# Patient Record
Sex: Female | Born: 1950 | Race: White | Hispanic: No | Smoking: Never smoker
Health system: Southern US, Community
[De-identification: ages and names within clinical notes are randomized; demographics above are authoritative.]

## PROBLEM LIST (undated history)

## (undated) DIAGNOSIS — I10 Essential (primary) hypertension: Secondary | ICD-10-CM

## (undated) DIAGNOSIS — E669 Obesity, unspecified: Secondary | ICD-10-CM

## (undated) DIAGNOSIS — R609 Edema, unspecified: Secondary | ICD-10-CM

## (undated) DIAGNOSIS — E119 Type 2 diabetes mellitus without complications: Secondary | ICD-10-CM

## (undated) DIAGNOSIS — E039 Hypothyroidism, unspecified: Secondary | ICD-10-CM

## (undated) HISTORY — PX: BACK SURGERY: SHX140

## (undated) HISTORY — PX: WRIST SURGERY: SHX841

## (undated) HISTORY — PX: BREAST SURGERY: SHX581

## (undated) HISTORY — PX: TUBAL LIGATION: SHX77

## (undated) HISTORY — PX: KNEE SURGERY: SHX244

---

## 1982-11-27 HISTORY — PX: PITUITARY SURGERY: SHX203

## 1988-11-27 HISTORY — PX: REDUCTION MAMMAPLASTY: SUR839

## 1999-04-21 ENCOUNTER — Other Ambulatory Visit: Admission: RE | Admit: 1999-04-21 | Discharge: 1999-04-21 | Payer: Self-pay | Admitting: Obstetrics and Gynecology

## 1999-05-19 ENCOUNTER — Other Ambulatory Visit: Admission: RE | Admit: 1999-05-19 | Discharge: 1999-05-19 | Payer: Self-pay | Admitting: Obstetrics and Gynecology

## 1999-07-07 ENCOUNTER — Ambulatory Visit (HOSPITAL_COMMUNITY): Admission: RE | Admit: 1999-07-07 | Discharge: 1999-07-07 | Payer: Self-pay | Admitting: Neurosurgery

## 2000-11-05 ENCOUNTER — Ambulatory Visit (HOSPITAL_COMMUNITY): Admission: RE | Admit: 2000-11-05 | Discharge: 2000-11-05 | Payer: Self-pay | Admitting: Gastroenterology

## 2000-12-28 ENCOUNTER — Encounter: Admission: RE | Admit: 2000-12-28 | Discharge: 2000-12-28 | Payer: Self-pay | Admitting: Obstetrics and Gynecology

## 2000-12-28 ENCOUNTER — Encounter: Payer: Self-pay | Admitting: Obstetrics and Gynecology

## 2003-07-06 ENCOUNTER — Encounter: Payer: Self-pay | Admitting: Obstetrics and Gynecology

## 2003-07-06 ENCOUNTER — Encounter: Admission: RE | Admit: 2003-07-06 | Discharge: 2003-07-06 | Payer: Self-pay | Admitting: Obstetrics and Gynecology

## 2004-08-05 ENCOUNTER — Encounter: Admission: RE | Admit: 2004-08-05 | Discharge: 2004-08-05 | Payer: Self-pay | Admitting: Obstetrics and Gynecology

## 2005-07-27 ENCOUNTER — Ambulatory Visit (HOSPITAL_COMMUNITY): Admission: RE | Admit: 2005-07-27 | Discharge: 2005-07-27 | Payer: Self-pay | Admitting: Orthopedic Surgery

## 2006-03-22 ENCOUNTER — Encounter: Admission: RE | Admit: 2006-03-22 | Discharge: 2006-03-22 | Payer: Self-pay | Admitting: Endocrinology

## 2007-08-08 ENCOUNTER — Encounter: Admission: RE | Admit: 2007-08-08 | Discharge: 2007-08-08 | Payer: Self-pay | Admitting: Obstetrics and Gynecology

## 2008-04-29 ENCOUNTER — Encounter: Admission: RE | Admit: 2008-04-29 | Discharge: 2008-04-29 | Payer: Self-pay | Admitting: Specialist

## 2008-05-06 ENCOUNTER — Encounter: Admission: RE | Admit: 2008-05-06 | Discharge: 2008-05-06 | Payer: Self-pay | Admitting: Specialist

## 2008-06-04 ENCOUNTER — Inpatient Hospital Stay (HOSPITAL_COMMUNITY): Admission: RE | Admit: 2008-06-04 | Discharge: 2008-06-07 | Payer: Self-pay | Admitting: Neurosurgery

## 2008-08-10 ENCOUNTER — Encounter: Admission: RE | Admit: 2008-08-10 | Discharge: 2008-08-10 | Payer: Self-pay | Admitting: Endocrinology

## 2008-11-11 ENCOUNTER — Encounter: Admission: RE | Admit: 2008-11-11 | Discharge: 2008-11-11 | Payer: Self-pay | Admitting: Sports Medicine

## 2008-12-22 ENCOUNTER — Ambulatory Visit (HOSPITAL_COMMUNITY): Admission: RE | Admit: 2008-12-22 | Discharge: 2008-12-22 | Payer: Self-pay | Admitting: Orthopedic Surgery

## 2009-09-27 ENCOUNTER — Encounter: Admission: RE | Admit: 2009-09-27 | Discharge: 2009-09-27 | Payer: Self-pay | Admitting: Endocrinology

## 2010-10-03 ENCOUNTER — Encounter: Admission: RE | Admit: 2010-10-03 | Discharge: 2010-10-03 | Payer: Self-pay | Admitting: Endocrinology

## 2010-11-08 ENCOUNTER — Encounter: Admission: RE | Admit: 2010-11-08 | Payer: Self-pay | Source: Home / Self Care | Admitting: Neurosurgery

## 2010-11-15 ENCOUNTER — Encounter
Admission: RE | Admit: 2010-11-15 | Discharge: 2010-11-15 | Payer: Self-pay | Source: Home / Self Care | Attending: Neurosurgery | Admitting: Neurosurgery

## 2010-12-27 ENCOUNTER — Ambulatory Visit (HOSPITAL_COMMUNITY)
Admission: RE | Admit: 2010-12-27 | Discharge: 2010-12-27 | Payer: Self-pay | Source: Home / Self Care | Attending: Neurosurgery | Admitting: Neurosurgery

## 2010-12-27 LAB — GLUCOSE, CAPILLARY: Glucose-Capillary: 96 mg/dL (ref 70–99)

## 2011-01-24 ENCOUNTER — Ambulatory Visit (HOSPITAL_COMMUNITY)
Admission: RE | Admit: 2011-01-24 | Discharge: 2011-01-24 | Disposition: A | Discharge status: Home / Self Care | Payer: 59 | Source: Ambulatory Visit | Attending: Neurosurgery | Admitting: Neurosurgery

## 2011-01-24 ENCOUNTER — Other Ambulatory Visit (HOSPITAL_COMMUNITY): Payer: Self-pay | Admitting: Neurosurgery

## 2011-01-24 ENCOUNTER — Encounter (HOSPITAL_COMMUNITY)
Admission: RE | Admit: 2011-01-24 | Discharge: 2011-01-24 | Disposition: A | Discharge status: Home / Self Care | Payer: 59 | Source: Ambulatory Visit | Attending: Neurosurgery | Admitting: Neurosurgery

## 2011-01-24 DIAGNOSIS — Z01810 Encounter for preprocedural cardiovascular examination: Secondary | ICD-10-CM | POA: Insufficient documentation

## 2011-01-24 DIAGNOSIS — M5416 Radiculopathy, lumbar region: Secondary | ICD-10-CM

## 2011-01-24 DIAGNOSIS — M545 Low back pain: Secondary | ICD-10-CM

## 2011-01-24 DIAGNOSIS — R7611 Nonspecific reaction to tuberculin skin test without active tuberculosis: Secondary | ICD-10-CM | POA: Insufficient documentation

## 2011-01-24 DIAGNOSIS — M48061 Spinal stenosis, lumbar region without neurogenic claudication: Secondary | ICD-10-CM

## 2011-01-24 DIAGNOSIS — Z01818 Encounter for other preprocedural examination: Secondary | ICD-10-CM | POA: Insufficient documentation

## 2011-01-24 DIAGNOSIS — R0602 Shortness of breath: Secondary | ICD-10-CM | POA: Insufficient documentation

## 2011-01-24 DIAGNOSIS — I517 Cardiomegaly: Secondary | ICD-10-CM | POA: Insufficient documentation

## 2011-01-24 DIAGNOSIS — Z01812 Encounter for preprocedural laboratory examination: Secondary | ICD-10-CM | POA: Insufficient documentation

## 2011-01-24 DIAGNOSIS — G4730 Sleep apnea, unspecified: Secondary | ICD-10-CM | POA: Insufficient documentation

## 2011-01-24 LAB — CBC
Hemoglobin: 11.7 g/dL — ABNORMAL LOW (ref 12.0–15.0)
MCHC: 31.5 g/dL (ref 30.0–36.0)
MCV: 93.2 fL (ref 78.0–100.0)
Platelets: 182 10*3/uL (ref 150–400)
RBC: 3.98 MIL/uL (ref 3.87–5.11)
RDW: 14.7 % (ref 11.5–15.5)

## 2011-01-24 LAB — BASIC METABOLIC PANEL
BUN: 17 mg/dL (ref 6–23)
CO2: 31 mEq/L (ref 19–32)
Calcium: 9.5 mg/dL (ref 8.4–10.5)
Creatinine, Ser: 0.87 mg/dL (ref 0.4–1.2)
Sodium: 140 mEq/L (ref 135–145)

## 2011-01-24 LAB — TYPE AND SCREEN: Antibody Screen: NEGATIVE

## 2011-01-24 LAB — SURGICAL PCR SCREEN
MRSA, PCR: NEGATIVE
Staphylococcus aureus: NEGATIVE

## 2011-01-26 ENCOUNTER — Inpatient Hospital Stay (HOSPITAL_COMMUNITY): Payer: 59

## 2011-01-26 ENCOUNTER — Inpatient Hospital Stay (HOSPITAL_COMMUNITY)
Admission: RE | Admit: 2011-01-26 | Discharge: 2011-02-01 | DRG: 460 | Disposition: A | Discharge status: Rehabilitation Facility | Payer: 59 | Source: Ambulatory Visit | Attending: Neurosurgery | Admitting: Neurosurgery

## 2011-01-26 DIAGNOSIS — E119 Type 2 diabetes mellitus without complications: Secondary | ICD-10-CM | POA: Diagnosis present

## 2011-01-26 DIAGNOSIS — Z91041 Radiographic dye allergy status: Secondary | ICD-10-CM

## 2011-01-26 DIAGNOSIS — M48062 Spinal stenosis, lumbar region with neurogenic claudication: Secondary | ICD-10-CM | POA: Diagnosis present

## 2011-01-26 DIAGNOSIS — G4733 Obstructive sleep apnea (adult) (pediatric): Secondary | ICD-10-CM | POA: Diagnosis present

## 2011-01-26 DIAGNOSIS — M5137 Other intervertebral disc degeneration, lumbosacral region: Principal | ICD-10-CM | POA: Diagnosis present

## 2011-01-26 DIAGNOSIS — I10 Essential (primary) hypertension: Secondary | ICD-10-CM | POA: Diagnosis present

## 2011-01-26 DIAGNOSIS — F329 Major depressive disorder, single episode, unspecified: Secondary | ICD-10-CM | POA: Diagnosis present

## 2011-01-26 DIAGNOSIS — E039 Hypothyroidism, unspecified: Secondary | ICD-10-CM | POA: Diagnosis present

## 2011-01-26 DIAGNOSIS — Z981 Arthrodesis status: Secondary | ICD-10-CM

## 2011-01-26 DIAGNOSIS — F3289 Other specified depressive episodes: Secondary | ICD-10-CM | POA: Diagnosis present

## 2011-01-26 DIAGNOSIS — M353 Polymyalgia rheumatica: Secondary | ICD-10-CM | POA: Diagnosis present

## 2011-01-26 DIAGNOSIS — M51379 Other intervertebral disc degeneration, lumbosacral region without mention of lumbar back pain or lower extremity pain: Principal | ICD-10-CM | POA: Diagnosis present

## 2011-01-26 LAB — GLUCOSE, CAPILLARY: Glucose-Capillary: 125 mg/dL — ABNORMAL HIGH (ref 70–99)

## 2011-01-27 LAB — CBC
Hemoglobin: 10 g/dL — ABNORMAL LOW (ref 12.0–15.0)
MCHC: 31.9 g/dL (ref 30.0–36.0)
Platelets: 158 10*3/uL (ref 150–400)
RBC: 3.38 MIL/uL — ABNORMAL LOW (ref 3.87–5.11)

## 2011-01-27 LAB — GLUCOSE, CAPILLARY
Glucose-Capillary: 118 mg/dL — ABNORMAL HIGH (ref 70–99)
Glucose-Capillary: 157 mg/dL — ABNORMAL HIGH (ref 70–99)
Glucose-Capillary: 131 mg/dL — ABNORMAL HIGH (ref 70–99)
Glucose-Capillary: 136 mg/dL — ABNORMAL HIGH (ref 70–99)
Glucose-Capillary: 126 mg/dL — ABNORMAL HIGH (ref 70–99)

## 2011-01-27 LAB — BASIC METABOLIC PANEL
CO2: 29 mEq/L (ref 19–32)
Calcium: 8.8 mg/dL (ref 8.4–10.5)
Chloride: 102 mEq/L (ref 96–112)
GFR calc Af Amer: >60 mL/min (ref >60)
Sodium: 139 mEq/L (ref 135–145)

## 2011-01-28 LAB — GLUCOSE, CAPILLARY
Glucose-Capillary: 111 mg/dL — ABNORMAL HIGH (ref 70–99)
Glucose-Capillary: 91 mg/dL (ref 70–99)

## 2011-01-29 LAB — GLUCOSE, CAPILLARY
Glucose-Capillary: 142 mg/dL — ABNORMAL HIGH (ref 70–99)
Glucose-Capillary: 97 mg/dL (ref 70–99)
Glucose-Capillary: 85 mg/dL (ref 70–99)
Glucose-Capillary: 134 mg/dL — ABNORMAL HIGH (ref 70–99)
Glucose-Capillary: 138 mg/dL — ABNORMAL HIGH (ref 70–99)

## 2011-01-30 DIAGNOSIS — IMO0002 Reserved for concepts with insufficient information to code with codable children: Secondary | ICD-10-CM

## 2011-01-30 DIAGNOSIS — M48061 Spinal stenosis, lumbar region without neurogenic claudication: Secondary | ICD-10-CM

## 2011-01-30 LAB — GLUCOSE, CAPILLARY
Glucose-Capillary: 128 mg/dL — ABNORMAL HIGH (ref 70–99)
Glucose-Capillary: 118 mg/dL — ABNORMAL HIGH (ref 70–99)
Glucose-Capillary: 112 mg/dL — ABNORMAL HIGH (ref 70–99)

## 2011-01-30 LAB — POCT I-STAT 4, (NA,K, GLUC, HGB,HCT)
Glucose, Bld: 147 mg/dL — ABNORMAL HIGH (ref 70–99)
HCT: 28 % — ABNORMAL LOW (ref 36.0–46.0)
Potassium: 3.1 mEq/L — ABNORMAL LOW (ref 3.5–5.1)

## 2011-01-31 LAB — GLUCOSE, CAPILLARY
Glucose-Capillary: 107 mg/dL — ABNORMAL HIGH (ref 70–99)
Glucose-Capillary: 100 mg/dL — ABNORMAL HIGH (ref 70–99)

## 2011-02-01 ENCOUNTER — Inpatient Hospital Stay (HOSPITAL_COMMUNITY)
Admission: RE | Admit: 2011-02-01 | Discharge: 2011-02-11 | DRG: 945 | Disposition: A | Discharge status: Home / Self Care | Payer: 59 | Source: Other Acute Inpatient Hospital | Attending: Physical Medicine & Rehabilitation | Admitting: Physical Medicine & Rehabilitation

## 2011-02-01 DIAGNOSIS — E119 Type 2 diabetes mellitus without complications: Secondary | ICD-10-CM | POA: Diagnosis present

## 2011-02-01 DIAGNOSIS — E1165 Type 2 diabetes mellitus with hyperglycemia: Secondary | ICD-10-CM

## 2011-02-01 DIAGNOSIS — I10 Essential (primary) hypertension: Secondary | ICD-10-CM | POA: Diagnosis present

## 2011-02-01 DIAGNOSIS — M51379 Other intervertebral disc degeneration, lumbosacral region without mention of lumbar back pain or lower extremity pain: Secondary | ICD-10-CM | POA: Diagnosis present

## 2011-02-01 DIAGNOSIS — G4733 Obstructive sleep apnea (adult) (pediatric): Secondary | ICD-10-CM | POA: Diagnosis present

## 2011-02-01 DIAGNOSIS — IMO0001 Reserved for inherently not codable concepts without codable children: Secondary | ICD-10-CM | POA: Diagnosis present

## 2011-02-01 DIAGNOSIS — Z981 Arthrodesis status: Secondary | ICD-10-CM

## 2011-02-01 DIAGNOSIS — Z5189 Encounter for other specified aftercare: Principal | ICD-10-CM

## 2011-02-01 DIAGNOSIS — E669 Obesity, unspecified: Secondary | ICD-10-CM

## 2011-02-01 DIAGNOSIS — M4716 Other spondylosis with myelopathy, lumbar region: Secondary | ICD-10-CM

## 2011-02-01 DIAGNOSIS — M5137 Other intervertebral disc degeneration, lumbosacral region: Secondary | ICD-10-CM | POA: Diagnosis present

## 2011-02-01 DIAGNOSIS — E875 Hyperkalemia: Secondary | ICD-10-CM | POA: Diagnosis not present

## 2011-02-01 DIAGNOSIS — E876 Hypokalemia: Secondary | ICD-10-CM | POA: Diagnosis present

## 2011-02-01 DIAGNOSIS — R5381 Other malaise: Secondary | ICD-10-CM | POA: Diagnosis present

## 2011-02-01 DIAGNOSIS — D62 Acute posthemorrhagic anemia: Secondary | ICD-10-CM | POA: Diagnosis present

## 2011-02-01 DIAGNOSIS — K5900 Constipation, unspecified: Secondary | ICD-10-CM | POA: Diagnosis present

## 2011-02-01 DIAGNOSIS — E039 Hypothyroidism, unspecified: Secondary | ICD-10-CM | POA: Diagnosis present

## 2011-02-01 DIAGNOSIS — R5383 Other fatigue: Secondary | ICD-10-CM | POA: Diagnosis present

## 2011-02-01 DIAGNOSIS — F3289 Other specified depressive episodes: Secondary | ICD-10-CM | POA: Diagnosis present

## 2011-02-01 DIAGNOSIS — F329 Major depressive disorder, single episode, unspecified: Secondary | ICD-10-CM | POA: Diagnosis present

## 2011-02-01 DIAGNOSIS — E2749 Other adrenocortical insufficiency: Secondary | ICD-10-CM | POA: Diagnosis present

## 2011-02-01 DIAGNOSIS — M1710 Unilateral primary osteoarthritis, unspecified knee: Secondary | ICD-10-CM | POA: Diagnosis present

## 2011-02-01 LAB — MRSA PCR SCREENING: MRSA by PCR: NEGATIVE

## 2011-02-02 DIAGNOSIS — I10 Essential (primary) hypertension: Secondary | ICD-10-CM

## 2011-02-02 DIAGNOSIS — E1165 Type 2 diabetes mellitus with hyperglycemia: Secondary | ICD-10-CM

## 2011-02-02 DIAGNOSIS — M4716 Other spondylosis with myelopathy, lumbar region: Secondary | ICD-10-CM

## 2011-02-02 DIAGNOSIS — E669 Obesity, unspecified: Secondary | ICD-10-CM

## 2011-02-02 LAB — GLUCOSE, CAPILLARY: Glucose-Capillary: 104 mg/dL — ABNORMAL HIGH (ref 70–99)

## 2011-02-02 LAB — CBC
HCT: 25.5 % — ABNORMAL LOW (ref 36.0–46.0)
Hemoglobin: 8.1 g/dL — ABNORMAL LOW (ref 12.0–15.0)
MCV: 92.4 fL (ref 78.0–100.0)
Platelets: 198 10*3/uL (ref 150–400)
RBC: 2.76 MIL/uL — ABNORMAL LOW (ref 3.87–5.11)
WBC: 3.4 10*3/uL — ABNORMAL LOW (ref 4.0–10.5)

## 2011-02-02 LAB — COMPREHENSIVE METABOLIC PANEL
AST: 15 U/L (ref 0–37)
Albumin: 2.6 g/dL — ABNORMAL LOW (ref 3.5–5.2)
Alkaline Phosphatase: 35 U/L — ABNORMAL LOW (ref 39–117)
BUN: 7 mg/dL (ref 6–23)
Chloride: 95 mEq/L — ABNORMAL LOW (ref 96–112)
Potassium: 3.4 mEq/L — ABNORMAL LOW (ref 3.5–5.1)
Total Bilirubin: 0.5 mg/dL (ref 0.3–1.2)

## 2011-02-02 LAB — DIFFERENTIAL
Eosinophils Absolute: 0.1 10*3/uL (ref 0.0–0.7)
Lymphocytes Relative: 25 % (ref 12–46)
Lymphs Abs: 0.9 10*3/uL (ref 0.7–4.0)
Neutro Abs: 2 10*3/uL (ref 1.7–7.7)
Neutrophils Relative %: 57 % (ref 43–77)

## 2011-02-03 DIAGNOSIS — I10 Essential (primary) hypertension: Secondary | ICD-10-CM

## 2011-02-03 DIAGNOSIS — E1165 Type 2 diabetes mellitus with hyperglycemia: Secondary | ICD-10-CM

## 2011-02-03 DIAGNOSIS — E669 Obesity, unspecified: Secondary | ICD-10-CM

## 2011-02-03 DIAGNOSIS — IMO0001 Reserved for inherently not codable concepts without codable children: Secondary | ICD-10-CM

## 2011-02-03 DIAGNOSIS — M4716 Other spondylosis with myelopathy, lumbar region: Secondary | ICD-10-CM

## 2011-02-03 LAB — GLUCOSE, CAPILLARY: Glucose-Capillary: 89 mg/dL (ref 70–99)

## 2011-02-03 NOTE — Op Note (Signed)
Kathy Simpson, APSEY               ACCOUNT NO.:  [id number]  MEDICAL RECORD NO.:  [id number]           PATIENT TYPE:  I  LOCATION:  3113                         FACILITY:  [facility]  PHYSICIAN:  Cristi Loron, M.D.DATE OF BIRTH:  01-Aug-1951  DATE OF PROCEDURE:  01/26/2011 DATE OF DISCHARGE:                              OPERATIVE REPORT   BRIEF HISTORY:  The patient is a 59 year old white female who I performed an L4-5 decompression, instrumentation and fusion about 3 years ago.  The patient has generally done well.  She, however, has some chronic back pain.  The patient has developed increasing back, hip, and leg pain consistent with neurogenic claudication more recently.  She failed medical management, worked up with a lumbar myelo-CT. This study demonstrated the patient had significant disk degeneration, stenosis, etc. at L3-4.  I discussed the various treatment options with the patient including surgery.  She has weighed the risks, benefits, and alternatives of surgery, and decided to proceed with L3-4 decompression, instrumentation and fusion, as well as exploration of lumbar fusion given her history of chronic back pain.  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  PROCEDURE:  Exploration of lumbar fusion; L3-4 posterior lumbar interbody fusion with local morselized autograft bone and Actifuse bone graft extender; insertion of L3-4 interbody prosthesis (Capstone PEEK interbody prosthesis); L3-L5 posterior segmental instrumentation with Legacy titanium pedicle screws and rods; L3-4 posterolateral arthrodesis with local morselized autograft bone and Vitoss bone graft extender. Bilateral L3 laminotomies and foraminotomies to decompress bilateral L3 as well as L4 nerve roots (the work required due to this was in addition to  work required to do posterior lumbar interbody fusion because of the patient's severe spinal stenosis, facet arthropathy, etc. requiring a wide facetectomy and wide laminotomies to decompress interbodies).  SURGEON:  Cristi Loron, MD  ASSISTANT:  Hewitt Shorts, MD  ANESTHESIA:  General endotracheal.  ESTIMATED BLOOD LOSS:  350 mL.  SPECIMENS:  None.  DRAINS:  None.  COMPLICATIONS:  None.  DESCRIPTION OF PROCEDURE:  The patient was brought to the operating room by the Anesthesia team.  General endotracheal anesthesia was induced. The patient was turned to the prone position on the Wilson frame.  Her lumbosacral region was then prepared with Betadine scrub and Betadine solution.  Sterile drapes were applied.  I then injected the area to be incised with Marcaine with epinephrine solution.  I used a scalpel to make a linear midline incision through the patient's previous surgical scar.  I used electrocautery to perform a bilateral subperiosteal dissection exposing spinous process and lamina from approximately L2 down to L5.  We used electrocautery to expose the previous hardware at L4-5, which was used for localization purposes.  We inserted the Versatrac retractor for exposure.  We then explored the fusion.  We removed the caps from the old screws at L4-L5 and removed the rods.  I attempted to remove the screws independently, but it seemed that she had a good fusion at L4-5.  Now, we turned our attention to  decompression.  I used high-speed to perform bilateral 3 laminotomies.  I widened laminotomies with Kerrison punch and removed the inferior facet of L3 by removing L3-4 ligamentum flavum.  In the cephalad aspect of the L4 lamina, I performed wide foraminotomies about the bilateral L3 and L4 nerve roots completing the decompression at this level.  We now turned attention to the posterior lumbar interbody fusion.  I incised the L3-4 and L4-5 intervertebral disk  bilaterally, performed a partial intervertebral dissection with the pituitary forceps.  We then prepared the vertebral endplates for fusion by removing soft tissues using the curettes and pituitary forceps.  Used trial spacers and determined to use a 14 x 26-mm interbody prosthesis bilaterally.  We prefilled this prosthesis with local morselized autograft bone that we obtained during decompression as well as Actifuse bone graft extender. We inserted the prosthesis into the L3-4 interspaces.  Of course after retracting the neural structures out of harm's way.  There was good snug fit of the prosthesis in interspace.  We filled the remainder of the disk space with Actifuse and local autograft bone completing the posterior lumbar interbody fusion.  We now turned our attention to the instrumentation.  We attempted to use intraoperative fluoroscopy, but because of the patient's body habitus, we could not get any useful images.  We also attempted use intraoperative x-ray, which was unhelpful either.  I cannulated the bilateral L3 pedicles with the bone probe.  I removed the bone probes and then tapped the pedicles with a 6.5-mm tap.  I then removed the tapped and probed inside the tapped pedicle to rule out cortical breeches.  I then inserted a 7.5 x 45-mm pedicles bilaterally into the L3 pedicle.  We got good bony purchase.  I then palpated along the medial aspect of the L3 pedicle and noted there that there were no cortical breeches.  We then connected the unilateral pedicle screws from L3-L5 with a lordotic rod.  We compressed the construct and secured the rod in place with the caps.  This completed the instrumentation.  We now turned our attention to the posterolateral arthrodesis.  I used high-speed drill to decorticate the remainder of the L3-4 pars and transverse process with a combination of local autograft bone and Vitoss bone graft extender over these decorticated posterior  structures.  This completed the posterolateral arthrodesis.  We then obtained hemostasis using bipolar cautery.  We irrigated the wound out with bacitracin solution.  We then inspected the thecal sac in bilateral L3-4 nerve roots and noted the neural structure well decompressed.  We then removed the retractor and then reapproximated the patient's thoracolumbar fascia with interrupted #1 Vicryl suture, subcutaneous tissue with interrupted 2-0 Vicryl suture, and skin with Steri-Strips and Benzoin.  The wound was then coated with bacitracin ointment and sterile dressing applied.  The drapes were removed.  The patient was subsequently returned to supine position where she was extubated by the Anesthesia team and transported to post anesthesia care unit in stable condition.  All sponge, instrument, and needle counts were correct at the end of this case.     Cristi Loron, M.D.     JDJ/MEDQ  D:  01/26/2011  T:  01/27/2011  Job:  [id number]  Electronically Signed by Tressie Stalker M.D. on 02/02/2011 02:39:16 PM

## 2011-02-04 DIAGNOSIS — M4716 Other spondylosis with myelopathy, lumbar region: Secondary | ICD-10-CM

## 2011-02-04 DIAGNOSIS — E669 Obesity, unspecified: Secondary | ICD-10-CM

## 2011-02-04 DIAGNOSIS — I10 Essential (primary) hypertension: Secondary | ICD-10-CM

## 2011-02-04 DIAGNOSIS — E1165 Type 2 diabetes mellitus with hyperglycemia: Secondary | ICD-10-CM

## 2011-02-04 LAB — GLUCOSE, CAPILLARY
Glucose-Capillary: 108 mg/dL — ABNORMAL HIGH (ref 70–99)
Glucose-Capillary: 128 mg/dL — ABNORMAL HIGH (ref 70–99)

## 2011-02-05 LAB — GLUCOSE, CAPILLARY

## 2011-02-06 LAB — GLUCOSE, CAPILLARY
Glucose-Capillary: 98 mg/dL (ref 70–99)
Glucose-Capillary: 109 mg/dL — ABNORMAL HIGH (ref 70–99)
Glucose-Capillary: 104 mg/dL — ABNORMAL HIGH (ref 70–99)

## 2011-02-06 LAB — BASIC METABOLIC PANEL
GFR calc non Af Amer: >60 mL/min (ref >60)
Glucose, Bld: 97 mg/dL (ref 70–99)
Potassium: 3.5 mEq/L (ref 3.5–5.1)
Sodium: 137 mEq/L (ref 135–145)

## 2011-02-07 LAB — GLUCOSE, CAPILLARY
Glucose-Capillary: 92 mg/dL (ref 70–99)
Glucose-Capillary: 106 mg/dL — ABNORMAL HIGH (ref 70–99)
Glucose-Capillary: 115 mg/dL — ABNORMAL HIGH (ref 70–99)

## 2011-02-08 LAB — GLUCOSE, CAPILLARY
Glucose-Capillary: 95 mg/dL (ref 70–99)
Glucose-Capillary: 115 mg/dL — ABNORMAL HIGH (ref 70–99)

## 2011-02-09 LAB — GLUCOSE, CAPILLARY
Glucose-Capillary: 99 mg/dL (ref 70–99)
Glucose-Capillary: 123 mg/dL — ABNORMAL HIGH (ref 70–99)
Glucose-Capillary: 131 mg/dL — ABNORMAL HIGH (ref 70–99)

## 2011-02-10 DIAGNOSIS — E1165 Type 2 diabetes mellitus with hyperglycemia: Secondary | ICD-10-CM

## 2011-02-10 DIAGNOSIS — M4716 Other spondylosis with myelopathy, lumbar region: Secondary | ICD-10-CM

## 2011-02-10 DIAGNOSIS — I10 Essential (primary) hypertension: Secondary | ICD-10-CM

## 2011-02-10 DIAGNOSIS — E669 Obesity, unspecified: Secondary | ICD-10-CM

## 2011-02-10 LAB — GLUCOSE, CAPILLARY
Glucose-Capillary: 127 mg/dL — ABNORMAL HIGH (ref 70–99)
Glucose-Capillary: 111 mg/dL — ABNORMAL HIGH (ref 70–99)

## 2011-02-10 NOTE — H&P (Signed)
Kathy Simpson, Kathy Simpson               ACCOUNT NO.:  [id number]  MEDICAL RECORD NO.:  [id number]           PATIENT TYPE:  I  LOCATION:  4028                         FACILITY:  [facility]  PHYSICIAN:  Ranelle Oyster, M.D.DATE OF BIRTH:  1950-12-07  DATE OF ADMISSION:  02/01/2011 DATE OF DISCHARGE:                             HISTORY & PHYSICAL   CHIEF COMPLAINT:  Low back pain and left leg weakness.  PRIMARY CARE PHYSICIAN:  Thora Lance, M.D.  NEUROSURGEON:  Cristi Loron, M.D.  HISTORY OF PRESENT ILLNESS:  This is a 60 year old white female with obesity, with history of decompression at L4-L5 in 2009; admitted on March 1 with increased low back pain radiating to the lower extremities. She had recent lumbar scan showing L3-L4 degenerative disk disease, stenosis with radiculopathy.  She underwent exploration of the L3-L4 and PLIF with L3-L5 posterior segmental instrumentation on March 1 by Dr. Lovell Sheehan.  She is to wear back brace when she is out of bed.  Pain control has been adequate.  She complains of left weakness and difficulty with general movement.  Rehab is asked to consult on the patient, who felt that she would be a good candidate for inpatient rehab.  REVIEW OF SYSTEMS:  Notable for depression, low back pain, and weakness. Full 12-point review is in the written H and P.  PAST MEDICAL HISTORY:  Positive for hypertension, transsphenoidal hypophysectomy for pituitary tumor, hypothyroidism, polymyalgia rheumatica, depression, obesity, 444 pounds, diabetes non-insulin- requiring, L4-L5 decompression in 2009, ORIF right wrist, hemorrhoidectomy, left knee scope, and bilateral breast reduction.  FAMILY HISTORY:  Positive for CAD.  SOCIAL HISTORY:  The patient lives with her husband.  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  Husband had back surgery 5 weeks ago.  A 1- level house with ramp.  A local daughter works.  Sister is an Astronomer. here at [employer].  The patient  does not smoke or drink.  ALLERGIES:  MAXZIDE, CONTRAST MEDIA, ADHESIVE TAPE, MORPHINE SULFATE.  MEDICATIONS:  Please see written H and P.  PHYSICAL EXAMINATION:  VITAL SIGNS:  Blood pressure is 108/65, pulse is 71, respiratory rate is 24, and temperature 98.4. GENERAL:  The patient is pleasant, alert, sitting in bed.  She is morbidly obese.  Pupils are equally round and reactive to light. HEENT:  Her nose and throat exam is notable for fair dentition and pink moist mucosa. NECK:  Supple without JVD or lymphadenopathy. CHEST:  Clear to auscultation without wheezes, rales, or rhonchi. HEART:  Regular rate and rhythm without murmur, rubs, or gallops. EXTREMITIES:  No clubbing or cyanosis, but 2+ nonpitting edema. ABDOMEN:  Soft, nontender.  Bowel sounds are positive. SKIN:  Notable for back incision which was clean and intact.  There were some reddened areas which were likely tape burns.  Otherwise, no obvious signs of breakdown. NEUROLOGIC:  Cranial nerves II-XII are intact.  Reflexes 1+.  Sensation is grossly normal in all four limbs.  Judgment, orientation, memory, and mood were all appropriate.  Strength in the upper extremities is 5/5. Left lower extremity was 1/5 hip flexion, 2/5 knee extension  and flexion, 3+ to 4 out of 5 ankle dorsiflexion and plantar flexion.  Right lower extremity is 2/5 with hip, 3/5 right knee, and 4/5 right ankle.  POST-ADMISSION PHYSICIAN EVALUATION: 1. Functional deficit secondary to lumbar stenosis and radiculopathy,     status post L3-L5 posterior segmental instrumentation and     exploration of L3-L4 PLIF on January 26, 2011. 2. The patient is admitted to receive collaborative interdisciplinary     care between the physiatrist, rehab nursing staff, and therapy     team. 3. The patient's level of medical complexity and substantial therapy     needs in context of that medical necessity cannot be provided at a     lesser intensity of care. 4. The  patient has experienced substantial functional loss from her     baseline.  Previously, she was independent in driving.  Currently,     she is total assist bed mobility, min assist transfer, min assist     gait, 4 feet rolling walker, and mod to max assist ADLs.  Judging     by the patient's diagnosis, physical exam, and functional history,     she has a potential for functional progress which will result in     measurable gains while in inpatient rehab.  These gains will be of     substantial and practical use upon discharge to home and facilitate     mobility and self-care. 5. The physiatrist will provide 24-hour management of medical needs as     well as oversight of therapy plan/treatment and provide guidance as     appropriate regarding interaction of the two.  Medical problem list     and plan are below. 6. The 24-hour rehab nursing team will assist in managing the     patient's skin care needs as well as bowel and bladder function,     safety awareness, and integration of therapy concepts and     techniques. 7. PT will assess and treat for lower extremity strength, range of     motion, adaptive techniques, equipment, functional ability, family     education with goals supervision for simple mobility tasks. 8. OT will assess and treat for upper extremity use, ADLs, adaptive     techniques, equipment, functional mobility, gait, donning and     doffing of brace with goals modified independent to occasional set     up with ADLs. 9. Case management and social worker will assess and treat for     psychosocial issues at discharge. 10.Team conference to be held weekly to assess progress towards goals     and determine barriers of discharge. 11.The patient demonstrated sufficient medical stability and exercise     capacity to tolerate at least 3 hours of therapy daily, at least 5     days per week. 12.Estimated length of stay is approximately 2 weeks.  Prognosis is     good.  MEDICAL  PROBLEM LIST AND PLAN: 1. DVT prophylaxis with PAS stockings.  Also consider low-dose     Lovenox. 2. Pain managed with oxycodone IR and Robaxin. 3. Mood.  Provided positive reinforcement.  The patient is on Celexa     for depression and anxiety. 4. Renal insufficiency.  Continue hydrocortisone per baseline regimen     as well as thyroid supplementation. 5. Blood pressure.  Continue atenolol, HCTZ, and lisinopril with good     control at present. 6. Non-insulin-requiring diabetes.  Actos 45 mg daily. 7. Obstructive sleep  apnea:  CPAP.     Ranelle Oyster, M.D.     ZTS/MEDQ  D:  02/01/2011  T:  02/02/2011  Job:  [id number]  cc:   Thora Lance, M.D. Cristi Loron, M.D.  Electronically Signed by Faith Rogue M.D. on 02/10/2011 04:16:47 PM

## 2011-02-10 NOTE — Discharge Summary (Signed)
Kathy Simpson, Kathy Simpson               ACCOUNT NO.:  [id number]  MEDICAL RECORD NO.:  [id number]           PATIENT TYPE:  I  LOCATION:  4028                         FACILITY:  [facility]  PHYSICIAN:  Ranelle Oyster, M.D.DATE OF BIRTH:  1951-08-11  DATE OF ADMISSION:  02/01/2011 DATE OF DISCHARGE:  02/11/2011                              DISCHARGE SUMMARY   DISCHARGE DIAGNOSES: 1. Lumbar stenosis with radiculopathy. 2. Subcutaneous Lovenox for deep vein thrombosis prophylaxis. 3. Pain management. 4. Depression. 5. Hypertension. 6. Adrenal insufficiency. 7. Hypothyroidism. 8. Noninsulin-dependent diabetes mellitus. 9. Morbid obesity. 10.Obstructive sleep apnea.  PROCEDURES:  Status post lumbar L3-L5 posterior segmental instrumentation and exploration of lumbar 3-4 with PLIF on January 26, 2011.  A 60 year old obese female with history of lumbar decompression in 2009, admitted on January 26, 2011 with increased low back pain radiating to lower extremities.  Recent lumbar myelogram showed lumbar L3-4 degenerative disk disease, stenosis, with radiculopathy.  She underwent exploration of lumbar L3-4 PLIF with lumbar L3-L5 posterior segmental instrumentation on January 26, 2011 per Dr. Lovell Sheehan.  Back brace when out of bed.  Pain management with PCA, discontinued January 28, 2011.  The patient uses CPAP at bedtime for obstructive sleep apnea.  Total assist for bed mobility, minimal assist for ambulation.  The patient was admitted for comprehensive rehab program.  PAST MEDICAL HISTORY:  See discharge diagnoses.  No alcohol or tobacco.  ALLERGIES:  MAXZIDE, CONTRAST MEDIA, ADHESIVE TAPE, and MORPHINE SULFATE.  SOCIAL HISTORY:  Lives with husband, works for [employer] as a Designer, jewellery.  Husband with recent back surgery 5 weeks ago, 1-level home with a ramp.  Local daughter, works.  Sister is a Designer, jewellery at [employer].  Functional history prior to admission  was independent driving.  Functional status upon admission to rehab services was total assist bed mobility, minimal assist transfers, minimal assistance ambulation with a rolling walker, moderate to max assist activities of daily living.  MEDICATIONS PRIOR TO ADMISSION: 1. Hydrocortisone 20 mg a.m., 10 mg at bedtime. 2. Quinapril 40 mg daily. 3. Celexa 20 mg daily. 4. Atenolol 100 mg daily. 5. Tramadol as needed. 6. Potassium chloride 20 mEq a.m., 10 mg at bedtime. 7. Hydrochlorothiazide 50 mg daily. 8. Actos 45 mg daily. 9. Synthroid 175 mcg daily.  PHYSICAL EXAMINATION:  VITAL SIGNS:  Blood pressure 108/65, pulse 71, temperature 98.4, respirations 20. NEUROLOGIC:  This was an alert female in no acute distress, oriented x3. Deep tendon reflexes 2+.  She moves all extremities. Sensation mildly decreased to light touch distally. BACK:  Incision clean, dry, and intact. LUNGS:  Clear to auscultation. CARDIAC:  Regular rate and rhythm. ABDOMEN:  Soft, nontender.  Good bowel sounds.  REHABILITATION HOSPITAL COURSE:  The patient was admitted to inpatient rehab services with therapies initiated on a 3-hour daily basis consisting of physical therapy, occupational therapy, and rehabilitation nursing.  The following issues were addressed during the patient's rehabilitation stay.  Pertaining to Ms. Woon's lumbar stenosis, radiculopathy with lumbar exploration as well as L3-5 posterior segmental instrumentation on January 26, 2011 per Dr. Tressie Stalker, surgical site healing nicely.  Neurovascular sensation intact.  She was wearing a back brace when out of bed.  Pain management ongoing with the use of Voltaren gel as well as oxycodone for breakthrough pain, Robaxin for muscle spasms on an as-needed basis with good results.  She continued on subcutaneous Lovenox for deep vein thrombosis prophylaxis. Her blood pressures were well-controlled on atenolol, hydrochlorothiazide, and  lisinopril.  She did have a history of adrenal insufficiency and remained on hydrocortisone as prior to hospital admission.  She will continue hormone supplement for hypothyroidism. Her blood sugars were well controlled on Actos and diabetic diet with good results.  The patient received weekly collaborative interdisciplinary team conferences to discuss estimated length of stay, family teaching, and any barriers to discharge.  She was supervision to minimal assist overall for activities of daily living, supervision for mobility except occasional minimal assist for sit to supine.  Strength and endurance greatly improved throughout her rehab stay.  She was encouraged with her overall progress.  Her Foley catheter tube had been removed.  She was voiding without difficulty.  She did have some mild constipation, which was resolved with laxative assistance.  Anemia:  Latest hemoglobin stable.  Chemistries unremarkable.  Latest hemoglobin of 8.1 with no orthostatic blood pressure changes.  No chest pain.  No shortness of breath.  She was discharged to home after family teaching completed.  DISCHARGE MEDICATIONS:  Included: 1. Atenolol 100 mg daily. 2. Celexa 20 mg daily. 3. Colace 100 mg twice daily. 4. Hydrochlorothiazide 50 mg daily. 5. Hydrocortisone 20 mg a.m., 10 mg p.m. 6. Synthroid 175 mcg daily. 7. Lisinopril 40 mg daily. 8. Actos 45 mg at bedtime. 9. Ferrous sulfate 325 mg twice daily. 10.Potassium chloride 20 mEq twice daily. 11.Voltaren gel twice daily to affected area. 12.Robaxin 500 mg every 6 hours as needed for spasms, dispense of 90     tablets. 13.Oxycodone immediate release 5 mg 1 or 2 tablets every 4 hours as     needed, dispense of 90 tablets.  Her diet was a diabetic diet.  SPECIAL INSTRUCTIONS:  Back brace when out of bed.  The patient should follow up with Dr. Tressie Stalker, Neurosurgery, call for appointment; Dr. Faith Rogue at the outpatient rehab service  office as needed; Dr. Kirby Funk, medical management.     Mariam Dollar, P.A.   ______________________________ Ranelle Oyster, M.D.    DA/MEDQ  D:  02/09/2011  T:  02/09/2011  Job:  [id number]  cc:   Cristi Loron, M.D. Thora Lance, M.D.  Electronically Signed by Mariam Dollar P.A. on 02/10/2011 02:42:50 PM Electronically Signed by Faith Rogue M.D. on 02/10/2011 04:16:51 PM

## 2011-02-27 NOTE — Discharge Summary (Signed)
  NAMESAMEENA, Kathy Simpson               ACCOUNT NO.:  [id number]  MEDICAL RECORD NO.:  [id number]           PATIENT TYPE:  I  LOCATION:  3033                         FACILITY:  [facility]  PHYSICIAN:  Cristi Loron, M.D.DATE OF BIRTH:  [birth year]  DATE OF ADMISSION:  01/26/2011 DATE OF DISCHARGE:  02/01/2011                              DISCHARGE SUMMARY   BRIEF HISTORY:  The patient is a 60 year old morbidly obese female who I performed L4-5 decompression, instrumentation and fusion about 3 years ago.  The patient has generally did well, however, she has had some chronic back pain.  The patient has developed increasing back and leg pain consistent with neurogenic claudication.  She failed medical management and worked up with a lumbar mild CT.  This study demonstrated the patient has significant disk degeneration stenosis etc at L3-4.  I discussed the various treatment options with the patient including surgery.  She has weighed the risks, benefits and alternatives of surgery and decided to proceed with an L3-4 decompression, instrumentation and fusion, as well as exploration of lumbar fusion given her history of chronic low back pain.  For further details of this admission, please refer to typed history and physical.  HOSPITAL COURSE:  Admitted the patient to [facility] on January 26, 2011, the day of admission I performed an exploration of lumbar fusion with an L3-4, decompression, instrumentation and fusion.  The surgery went well (for full details of this operation please refer to typed operative note).  POSTOPERATIVE COURSE:  The patient's postoperative course was remarkable as follows.  We had PT, OT and PMR see the patient.  The patient was slow to mobilize.  By February 01, 2011, the patient was afebrile, vital signs stable and she was transferred up in the inpatient rehabilitation unit.  FINAL DIAGNOSES:  L3-4 disk degeneration, spinal stenosis, lumbar radiculopathy,  lumbago, morbid obesity.  Procedure performed was exploration of lumbar fusion; L3-4 posterior lumbar interbody fusion with local morselized autograft bone and Actifuse bone graft extender; insertion of L3-4 interbody prosthesis capsule PEEK interbody prosthesis; L3-L5 posterior segmental fixation with legacy titanium pedicle screws and rods; L3-4 posterior and lateral arthrodesis with local morselized autograft bone and Vitoss bone graft extender; bilateral L3 laminotomies and foraminotomies to decompress bilateral L3 as well as L4 nerve roots.     Cristi Loron, M.D.     JDJ/MEDQ  D:  02/20/2011  T:  02/21/2011  Job:  [id number]  Electronically Signed by Tressie Stalker M.D. on 02/27/2011 07:33:17 AM

## 2011-03-13 LAB — BASIC METABOLIC PANEL
CO2: 29 mEq/L (ref 19–32)
Calcium: 9.1 mg/dL (ref 8.4–10.5)
Chloride: 97 mEq/L (ref 96–112)
Creatinine, Ser: 0.69 mg/dL (ref 0.4–1.2)
Glucose, Bld: 112 mg/dL — ABNORMAL HIGH (ref 70–99)

## 2011-03-13 LAB — GLUCOSE, CAPILLARY
Glucose-Capillary: 115 mg/dL — ABNORMAL HIGH (ref 70–99)
Glucose-Capillary: 92 mg/dL (ref 70–99)

## 2011-03-13 LAB — CBC
Hemoglobin: 10.5 g/dL — ABNORMAL LOW (ref 12.0–15.0)
MCHC: 33.5 g/dL (ref 30.0–36.0)
MCV: 90.9 fL (ref 78.0–100.0)
RDW: 16.2 % — ABNORMAL HIGH (ref 11.5–15.5)

## 2011-04-11 NOTE — Op Note (Signed)
Kathy Simpson, Kathy Simpson               ACCOUNT NO.:  [id number]   MEDICAL RECORD NO.:  [id number]          PATIENT TYPE:  AMB   LOCATION:  SDS                          FACILITY:  [facility]   PHYSICIAN:  Eulas Post, MD    DATE OF BIRTH:  1951-08-29   DATE OF PROCEDURE:  12/22/2008  DATE OF DISCHARGE:                               OPERATIVE REPORT   PREOPERATIVE DIAGNOSES:  Left knee medial and lateral meniscal tears  with patellofemoral chondromalacia and loose body and medial collateral  ligament strain.   POSTOPERATIVE DIAGNOSES:  Left knee medial and lateral meniscal tears  with patellofemoral chondromalacia and loose body and medial collateral  ligament strain.   OPERATIVE PROCEDURE:  Left knee arthroscopy with partial medial and  partial lateral meniscectomies with patellofemoral chondroplasty and  removal of loose body.   PREOPERATIVE INDICATIONS:  [name] is a 60 year old morbidly  obese woman who fell and injured her left knee last September.  She has  had fairly excruciating severe knee pain ever since and has been unable  to perform her activities of daily living due to her pain.  She  described the pain most significantly anteriorly and anterolaterally.  She elected to undergo the above-named procedures.  The risks, benefits,  and alternatives to operative intervention was discussed with her at  length preoperatively including not limited to the risks of infection,  bleeding, nerve injury, recurrent knee pain, progression of arthritis, a  need for total knee arthroplasty, blood clots, cardiopulmonary  complications, among others and she was willing to proceed.   OPERATIVE FINDINGS:  The patellofemoral joint had grade 3 changes on the  femoral trochlea.  There were grade 2 changes on the patella.  The ACL  was intact.  The MCL had a grade 2 sprain compared to the contralateral  side.  The medial compartment had degenerative changes in the meniscus  and some  irregular intrasubstance tears extending to the surface.  The  femoral condyle had grade 3 changes.  The tibial condyle had grade 2  changes.  The lateral compartment had a very large loose body that was  approximately 2 x 1 cm.  This was removed in entirety.  The lateral  femoral condyle had grade 3 changes, and the lateral tibial condyle had  grade 3 changes also with a small area of grade 4 changes.  The lateral  meniscus was torn completely and invaginated on itself and extruded.   OPERATIVE PROCEDURE:  The patient was brought to the operating room and  placed in supine position.  General anesthesia was administered, 2 g of  Ancef were given.  Left lower extremity is prepped and draped in usual  sterile fashion.  Diagnostic arthroscopy was carried out with the above-  named findings.  Collene Mares was utilized to perform a chondroplasty of the  patellofemoral joint.  The shaver was also utilized to remove portions  of the medial meniscus back to a stable rim.  An arthroscopic biter was  also utilized.  There was an osteophyte in the anterior portion of the  tibia, which had to  be removed in order to gain access to the medial  compartment.   The lateral compartment was debrided using the shaver.  Partial  meniscectomy using the shaver and the biter was carried out.  Loose body  was removed in entirety.  This had to be dissected free with a curette.  Once we had completed the loose body removal, the medial and lateral  meniscectomies, and the chondroplasty of the  patellofemoral joint, we irrigated the knee copiously and removed all  those debris and closed the portals with Monocryl, and Steri-Strips, and  sterile gauze, and an Ace wrap.  The patient was awakened and returned  to the PACU in stable and satisfactory condition.  There were no  complications.  The patient tolerated the procedure well.      Eulas Post, MD  Electronically Signed     JPL/MEDQ  D:  12/22/2008  T:   12/23/2008  Job:  [id number]

## 2011-04-11 NOTE — Discharge Summary (Signed)
Kathy Simpson, Kathy Simpson               ACCOUNT NO.:  [id number]   MEDICAL RECORD NO.:  [id number]          PATIENT TYPE:  INP   LOCATION:  3030                         FACILITY:  [facility]   PHYSICIAN:  Hewitt Shorts, M.D.DATE OF BIRTH:  [birth year]   DATE OF ADMISSION:  06/04/2008  DATE OF DISCHARGE:  06/07/2008                               DISCHARGE SUMMARY   ADMISSION HISTORY AND PHYSICAL EXAMINATION:  The patient is a 60-year-  old woman of Dr. Tressie Stalker, who suffers with chronic back pain and  leg pain.  He had evaluated her and found advanced degeneration at the  L4-L5 level and the patient is admitted for surgery.  Further details of  the admission history and physical examination are included in Dr.  Lovell Sheehan' admission note.   HOSPITAL COURSE:  The patient underwent an L4-L5 decompression and  arthrodesis with interbody fusion and posterolateral fusion.  Postoperatively, she has done well.  Her wound is healing nicely.  She  is up and ambulating with a rolling walker.  She has been seen by  Physical Therapy and Occupational Therapy.  Recommendation was made for  home health physical therapy and we have also given her order for wide  rolling walker.  Dr. Lovell Sheehan has given her a prescription for Valium 5  mg tablet 50 tablets and Percocet 10/325 mg 100 tablets, and he  recommended she will continue all of her usual home medications.   Discharge instructions have been given to the patient, and she is to  return to see Dr. Lovell Sheehan in 3 weeks for followup or sooner if she has  increased difficulties.   DISCHARGE DIAGNOSES:  1. Lumbar degenerative disk disease.  2. Lumbar stenosis.  3. Lumbar disk herniation.  4. Lumbar radiculopathy.      Hewitt Shorts, M.D.  Electronically Signed     RWN/MEDQ  D:  06/07/2008  T:  06/07/2008  Job:  [id number]

## 2011-04-11 NOTE — Op Note (Signed)
NAMEADELI, FROST               ACCOUNT NO.:  [id number]   MEDICAL RECORD NO.:  [id number]          PATIENT TYPE:  INP   LOCATION:  3030                         FACILITY:  [facility]   PHYSICIAN:  Cristi Loron, M.D.DATE OF BIRTH:  07/03/1951   DATE OF PROCEDURE:  06/04/2008  DATE OF DISCHARGE:                               OPERATIVE REPORT   BRIEF HISTORY:  The patient is a 60 year old white female who suffered  from chronic back pain.  The back pain has become intractable with  severe leg pain.  She failed medical management, worked up with a lumbar  myelo-CT, which demonstrated the patient had a severe degenerative  stenosis and herniated disk at L4-5.  I discussed the various treatments  and options with the patient including surgery.  The patient has weighed  the risks, benefits, and alternatives of the surgery, so I will proceed  with a L4-5 decompression, instrumentation, and fusion.   PREOPERATIVE DIAGNOSIS:  L4-5 degenerative disease, herniated nucleus  pulposus, spinal stenosis, lumbar radiculopathy, status myelopathy,  lumbago.   POSTOPERATIVE DIAGNOSIS:  L4-5 degenerative disease, herniated nucleus  pulposus, spinal stenosis, lumbar radiculopathy status myelopathy,  lumbago.   PROCEDURE:  Bilateral L4 laminotomies to decompress the bilateral L4 and  L5 nerve roots; L4-5 posterior lumbar interbody fusion with local  autograft bone, Vitoss/Actifuse bone graft extenders; insertion of the  L4-5 interbody prosthesis (PEEK interbody prosthesis); L4-5 posterior  nonsegmental instrumentation with legacy tied to gain pedicle screws and  rods; L4-5 posterolateral arthrodesis with local morselized autograft  bone, Vitoss and Actifuse bone extenders.   SURGEON:  Cristi Loron, M.D.   ASSISTANT:  Coletta Memos, M.D.   ANESTHESIA:  General endotracheal.   ESTIMATED BLOOD LOSS:  250 mL.   SPECIMENS:  None.   DRAINS:  None.   COMPLICATIONS:  None.   DESCRIPTION OF  PROCEDURE:  The patient was brought to the operating room  by anesthesia team.  General endotracheal anesthesia was induced.  The  patient was then turned to the prone position on Wilson frame.  Lumbosacral region was then prepared with Betadine scrub and Betadine  solution.  Sterile drapes were applied, I then injected the area to be  incised with Marcaine with epinephrine solution.  Using a scalpel to  make a linear midline incision over the L4-5 interspace.  We used  electrocautery to perform a bilateral subperiosteal section exposing  spinous process and lamina of L3, L4, and L5 in the upper sacrum.  We  obtained intraoperative radiographs.  We attempted to obtain  intraoperative radiographs to confirm our location, but because of the  patient's body habitus, we could not accurately identify our level.  I  then used intraoperative fluoroscopy and with quite a bit effort, we  were able to identify the L4-5 interspace.  We then inserted the Versa-  Trac retractor for exposure.   We began decompression by using high-speed drill to perform bilateral L4  laminotomies.  We widened the laminotomies with Kerrison punch and then  performed foraminotomies about the bilateral L4-L5 nerve roots,  decompressing these nerve roots.  Of  note, the extent of decompression  was greater than required due to lumbar interbody fusion secondary to  the superior facet arthropathy and severe stenosis at this level.   I completed decompression.  We now turned our attention to the interbody  fusion.  We incised the L4-5 intervertebral disks with #15 blade scalpel  bilaterally.  We performed intervertebral diskectomy with pituitary  forceps.  We prepared the vertebral end-plates at W4-1 bilaterally with  the curettes in preparation for the fusion.  We then used trial spacer  and determined to use an 8 x 26 mm Capstone PEEK interbody prosthesis.  We prefilled this prosthesis with combination of local autograft  bone.  We obtained bone decompression, Vitoss and Actifuse bone graft extenders  and then we inserted the prosthesis to the L4-5 interspaces bilaterally  of course retracting nerve structures out of harms way.  I should also  mention we filled the remainder of this disk space with the Vitoss,  Actifuse, and local autograft bone.  This completed the proximal lumbar  interbody fusion.   We now turned our attention to the instrumentation under fluoroscopic  guidance.  We cannulated the bilateral L4 and L5 pedicles with bone  probe.  We tapped pedicles with 5.5 mm tap and then we inserted 7.5 x 50  mm pedicle screws bilaterally at L4-L5 under fluoroscopic guidance.  I  should mention that prior to placing the pedicle screws, we probed  inside the tapped pedicles with a ball probe to rule out cortical  breeches.  After insertion of the pedicle screws, we palpated along the  medial aspect of the L4-L5 pedicles and noted that there was no cortical  breeches and the L4-L5 nerve roots were well decompressed.  We then  connected unilateral pedicle screws with lordotic rod, compressive  construct, secured rod in place with capsules retightened appropriately.  This completed the instrumentation.   We now turned our attention to posterolateral arthrodesis.  We used high-  speed drill to decorticate the remainder of the L4-5 facet pars and  bilateral transverse processes.  We laid a combination of local  autograft bone, Vitoss, and Actifuse bone graft extenders over these to  decorticated posterior structures.  This completed the post arthrodesis.   We then obtained hemostasis using bipolar electrocautery.  We irrigated  the wound with bacitracin solution.  We palpated along ventral surface  of the thecal sac and x-ray around the L4-L5 nerve roots noted they were  well decompressed.  We then removed the retractor and reapproximated the  patient's thoracolumbar fascia with interrupted #1 Vicryl  suture.  Subcutaneous tissue with interrupted 2-0 Vicryl suture and the skin with  Steri-Strips and Benzoin.  The wound was then coated with bacitracin  ointment.  Sterile dressing was applied.  The drapes were removed and  the patient was subsequently turned to supine position where she was  extubated by anesthesia team and transported to post anesthesia care  unit in stable condition.  All sponge, instrument, and needle counts  were correct at the end of the case.   Of note, given the fact that the patient was approximately 5 feet tall  and 360 pounds, i.e., morbidly obese, this surgery required a  considerable additional amount of work to position the patient's  exposure spine, perform the surgery, and close the wound.      Cristi Loron, M.D.  Electronically Signed    JDJ/MEDQ  D:  06/04/2008  T:  06/05/2008  Job:  [id number]

## 2011-04-14 NOTE — Op Note (Signed)
[name], Simpson               ACCOUNT NO.:  [id number]   MEDICAL RECORD NO.:  [id number]          PATIENT TYPE:  AMB   LOCATION:  SDS                          FACILITY:  [facility]   PHYSICIAN:  Doralee Albino. Carola Frost, M.D. DATE OF BIRTH:  1951-08-28   DATE OF PROCEDURE:  07/27/2005  DATE OF DISCHARGE:                                 OPERATIVE REPORT   PREOPERATIVE DIAGNOSIS:  Right intra-articular distal radius fracture.   POSTOPERATIVE DIAGNOSIS:  Right intra-articular distal radius fracture.   PROCEDURE:  Open reduction-internal fixation of right distal radius using  the DVR plate.   SURGEON:  Doralee Albino. Carola Frost, M.D.   ASSISTANT:  Cecil Cranker, P.A.   ANESTHESIA:  General.   COMPLICATIONS:  None.   TOTAL TOURNIQUET TIME:  120 minutes.   ESTIMATED BLOOD LOSS:  15 cc.   DISPOSITION:  To PACU.   CONDITION:  Stable.   BRIEF SUMMARY OF INDICATION FOR PROCEDURE:  Kathy Simpson is a 60 year old  right-hand-dominant white female on chronic steroids for hypopituitarism.  She sustained a severely comminuted fracture of her right distal radius and  underwent reduction in [place], [place] before seeking definitive  management back in her home town of [place].  Preoperatively, we  discussed the risks and benefits of surgery, including the possibility of  infection, delayed union, nonunion, nerve injury, vessel injury, loss of  motion and/or arthritis, and possible other complications.  After a full  discussion of these, she wished to proceed with surgical fixation.  She  understood her increased risk because of her steroid use and was seen  preoperatively by her primary care physician who felt she was clear for  surgery and also provided a schedule for stress-dose steroids.   DESCRIPTION OF PROCEDURE:  Kathy Simpson received preoperative gentamicin for  a urinary tract infection and also 100 mg of hydrocortisone.  She received a  gram of Ancef as well for antibiotic  prophylaxis.  She was prepped and  draped in the usual sterile fashion after administration of an interscalene  block which was very effective as well as general anesthesia.  Standard  volar Sherilyn Cooter approach was made to the distal radius, identifying the FCR  tendon and incising a deep portion of the sheath and then developing the  plane over the pronator.  The pronator fascia was divided about 6-10 mm from  the edge of the radius to enable repair following fixation.  An elevator was  then used to sweep the muscle ulnarly.  There was an extensive amount of  hemorrhage in the soft tissues.  It should be stated that the tourniquet was  inflated to 250 mmHg after exsanguination of the extremity with an Esmarch  bandage prior to making the incision.  The volar shear fragments were then  distracted, enabling a curette to clean the hematoma and clot from the  fracture ends.  Distraction was applied and a reduction maneuver performed  under direct visualization.  Two K-wire pins were then placed through the  radius, one through the styloid retrograde and one antegrade through the  distal radial metaphysis  into the ulnar corner.  This was not successful in  completely maintaining reduction, but did enable [place] to visualize the  essentially depressed articular segment which needed to be elevated.  A  Glorious Peach was then used to tamp up this area, pushing the articular surface of  back up to the appropriate level.  Cancellus allograft chips were then used  to support the elevated segments.  Once more, the sheared fragments along  the volar cortex were reduced, again under direct visualization and the  plate used to compress these into place.  A small Rogelia Rohrer was placed around  the ulnar corner and used to elevate this segment while securing it with two  screws.  The volar aspect adjacent to the scaphoid fossa had a tendency to  displace distally as it were being pushed out from underneath the plate.   Consequently, a #2 Fibrewire suture was passed onto the plate and through  the volar aspect of the capsule as it attached on this lip segment and used  to tie it back down into place. This resulted in an acceptable reduction.  Final AP and lateral images showed appropriate placement of all screws as  well as good position of the plate with proper length of screws in the  metaphyseal segment.  The tourniquet was deflated, hemostasis obtained with  electrocautery and then the pronator fascia repaired with figure-of-eight 0  Vicryl for the subcu and nylon sutures for the skin layer.  A sterile gently  compressive dressing was applied, the patient awakened from anesthesia and  transported to the PACU after application of a volar splint.   PROGNOSIS:  Kathy Simpson is at increased risk for infection, delayed union,  nonunion and hardware failure because of her chronic steroid use.  Currently  she appears to have an acceptable reduction, particularly given the  complexity of her fracture and she should go on to have restored function if  we can maintain this position until healing.  When she returns to clinic in  10 days we will remove her sutures and place her into a short-arm cast for  an additional 2 weeks.  She will double her dose of steroids as prescribed  by her primary care physician for three days and then taper back to the  initial dose.  She will be on Cipro for 5 days to completely clear her  urinary tract infection.      Doralee Albino. Carola Frost, M.D.  Electronically Signed     MHH/MEDQ  D:  07/27/2005  T:  07/27/2005  Job:  [id number]

## 2011-08-24 LAB — BASIC METABOLIC PANEL
BUN: 18
BUN: 6
CO2: 32
Chloride: 100
Chloride: 98
Creatinine, Ser: 0.7
GFR calc non Af Amer: >60
Glucose, Bld: 170 — ABNORMAL HIGH
Potassium: 3.8
Potassium: 4.3
Sodium: 138

## 2011-08-24 LAB — CBC
HCT: 28.4 — ABNORMAL LOW
HCT: 35.5 — ABNORMAL LOW
Hemoglobin: 11.9 — ABNORMAL LOW
MCHC: 33.9
MCV: 93.8
MCV: 93.8
Platelets: 138 — ABNORMAL LOW
Platelets: 190
RDW: 14.8
WBC: 4.2

## 2011-08-24 LAB — ABO/RH: ABO/RH(D): O NEG

## 2012-08-08 ENCOUNTER — Other Ambulatory Visit: Payer: Self-pay | Admitting: Neurosurgery

## 2012-08-08 DIAGNOSIS — M549 Dorsalgia, unspecified: Secondary | ICD-10-CM

## 2012-08-13 ENCOUNTER — Other Ambulatory Visit: Payer: Self-pay | Admitting: Obstetrics and Gynecology

## 2012-08-13 DIAGNOSIS — Z1231 Encounter for screening mammogram for malignant neoplasm of breast: Secondary | ICD-10-CM

## 2012-08-14 ENCOUNTER — Ambulatory Visit
Admission: RE | Admit: 2012-08-14 | Discharge: 2012-08-14 | Disposition: A | Discharge status: Home / Self Care | Payer: 59 | Source: Ambulatory Visit | Attending: Neurosurgery | Admitting: Neurosurgery

## 2012-08-14 DIAGNOSIS — M549 Dorsalgia, unspecified: Secondary | ICD-10-CM

## 2012-08-14 MED ORDER — GADOBENATE DIMEGLUMINE 529 MG/ML IV SOLN
20.0000 mL | Freq: Once | INTRAVENOUS | Status: AC | PRN
  Administered 2012-08-14: 20 mL via INTRAVENOUS

## 2012-09-03 ENCOUNTER — Ambulatory Visit
Admission: RE | Admit: 2012-09-03 | Discharge: 2012-09-03 | Disposition: A | Discharge status: Home / Self Care | Payer: 59 | Source: Ambulatory Visit | Attending: Obstetrics and Gynecology | Admitting: Obstetrics and Gynecology

## 2012-09-03 DIAGNOSIS — Z1231 Encounter for screening mammogram for malignant neoplasm of breast: Secondary | ICD-10-CM

## 2014-09-13 ENCOUNTER — Encounter (HOSPITAL_COMMUNITY): Payer: Self-pay | Admitting: Emergency Medicine

## 2014-09-13 ENCOUNTER — Inpatient Hospital Stay (HOSPITAL_COMMUNITY)
Admission: EM | Admit: 2014-09-13 | Discharge: 2014-09-24 | DRG: 871 | Disposition: A | Discharge status: Skilled Nursing Facility | Payer: 59 | Attending: Internal Medicine | Admitting: Internal Medicine

## 2014-09-13 ENCOUNTER — Emergency Department (HOSPITAL_COMMUNITY): Payer: 59

## 2014-09-13 DIAGNOSIS — I959 Hypotension, unspecified: Secondary | ICD-10-CM

## 2014-09-13 DIAGNOSIS — E119 Type 2 diabetes mellitus without complications: Secondary | ICD-10-CM | POA: Diagnosis present

## 2014-09-13 DIAGNOSIS — I10 Essential (primary) hypertension: Secondary | ICD-10-CM | POA: Diagnosis present

## 2014-09-13 DIAGNOSIS — L309 Dermatitis, unspecified: Secondary | ICD-10-CM | POA: Diagnosis present

## 2014-09-13 DIAGNOSIS — Z9119 Patient's noncompliance with other medical treatment and regimen: Secondary | ICD-10-CM | POA: Diagnosis present

## 2014-09-13 DIAGNOSIS — I5021 Acute systolic (congestive) heart failure: Secondary | ICD-10-CM | POA: Diagnosis not present

## 2014-09-13 DIAGNOSIS — R579 Shock, unspecified: Secondary | ICD-10-CM | POA: Diagnosis present

## 2014-09-13 DIAGNOSIS — S82892A Other fracture of left lower leg, initial encounter for closed fracture: Secondary | ICD-10-CM

## 2014-09-13 DIAGNOSIS — Z9181 History of falling: Secondary | ICD-10-CM | POA: Diagnosis not present

## 2014-09-13 DIAGNOSIS — R296 Repeated falls: Secondary | ICD-10-CM | POA: Diagnosis present

## 2014-09-13 DIAGNOSIS — M797 Fibromyalgia: Secondary | ICD-10-CM | POA: Diagnosis present

## 2014-09-13 DIAGNOSIS — G4733 Obstructive sleep apnea (adult) (pediatric): Secondary | ICD-10-CM | POA: Diagnosis present

## 2014-09-13 DIAGNOSIS — I5022 Chronic systolic (congestive) heart failure: Secondary | ICD-10-CM

## 2014-09-13 DIAGNOSIS — L03115 Cellulitis of right lower limb: Secondary | ICD-10-CM | POA: Diagnosis present

## 2014-09-13 DIAGNOSIS — Y92009 Unspecified place in unspecified non-institutional (private) residence as the place of occurrence of the external cause: Secondary | ICD-10-CM | POA: Diagnosis not present

## 2014-09-13 DIAGNOSIS — A419 Sepsis, unspecified organism: Principal | ICD-10-CM

## 2014-09-13 DIAGNOSIS — E230 Hypopituitarism: Secondary | ICD-10-CM | POA: Diagnosis present

## 2014-09-13 DIAGNOSIS — Z79899 Other long term (current) drug therapy: Secondary | ICD-10-CM

## 2014-09-13 DIAGNOSIS — R6521 Severe sepsis with septic shock: Secondary | ICD-10-CM | POA: Diagnosis present

## 2014-09-13 DIAGNOSIS — E861 Hypovolemia: Secondary | ICD-10-CM | POA: Diagnosis present

## 2014-09-13 DIAGNOSIS — E876 Hypokalemia: Secondary | ICD-10-CM | POA: Diagnosis present

## 2014-09-13 DIAGNOSIS — N179 Acute kidney failure, unspecified: Secondary | ICD-10-CM | POA: Diagnosis present

## 2014-09-13 DIAGNOSIS — J9690 Respiratory failure, unspecified, unspecified whether with hypoxia or hypercapnia: Secondary | ICD-10-CM

## 2014-09-13 DIAGNOSIS — S8251XA Displaced fracture of medial malleolus of right tibia, initial encounter for closed fracture: Secondary | ICD-10-CM | POA: Diagnosis present

## 2014-09-13 DIAGNOSIS — E2740 Unspecified adrenocortical insufficiency: Secondary | ICD-10-CM | POA: Diagnosis not present

## 2014-09-13 DIAGNOSIS — W1830XA Fall on same level, unspecified, initial encounter: Secondary | ICD-10-CM | POA: Diagnosis present

## 2014-09-13 DIAGNOSIS — W19XXXA Unspecified fall, initial encounter: Secondary | ICD-10-CM

## 2014-09-13 DIAGNOSIS — L89152 Pressure ulcer of sacral region, stage 2: Secondary | ICD-10-CM | POA: Diagnosis present

## 2014-09-13 DIAGNOSIS — T380X5A Adverse effect of glucocorticoids and synthetic analogues, initial encounter: Secondary | ICD-10-CM | POA: Diagnosis present

## 2014-09-13 DIAGNOSIS — N390 Urinary tract infection, site not specified: Secondary | ICD-10-CM

## 2014-09-13 DIAGNOSIS — Z7952 Long term (current) use of systemic steroids: Secondary | ICD-10-CM | POA: Diagnosis not present

## 2014-09-13 DIAGNOSIS — R531 Weakness: Secondary | ICD-10-CM | POA: Diagnosis present

## 2014-09-13 DIAGNOSIS — E039 Hypothyroidism, unspecified: Secondary | ICD-10-CM | POA: Diagnosis present

## 2014-09-13 DIAGNOSIS — D649 Anemia, unspecified: Secondary | ICD-10-CM | POA: Diagnosis present

## 2014-09-13 DIAGNOSIS — Z6841 Body Mass Index (BMI) 40.0 and over, adult: Secondary | ICD-10-CM | POA: Diagnosis not present

## 2014-09-13 HISTORY — DX: Type 2 diabetes mellitus without complications: E11.9

## 2014-09-13 HISTORY — DX: Obesity, unspecified: E66.9

## 2014-09-13 HISTORY — DX: Essential (primary) hypertension: I10

## 2014-09-13 HISTORY — DX: Edema, unspecified: R60.9

## 2014-09-13 HISTORY — DX: Hypothyroidism, unspecified: E03.9

## 2014-09-13 LAB — CBC WITH DIFFERENTIAL/PLATELET
Basophils Absolute: 0 10*3/uL (ref 0.0–0.1)
Basophils Relative: 1 % (ref 0–1)
Eosinophils Absolute: 0.2 10*3/uL (ref 0.0–0.7)
Eosinophils Relative: 3 % (ref 0–5)
HEMATOCRIT: 26.6 % — AB (ref 36.0–46.0)
HEMOGLOBIN: 8.7 g/dL — AB (ref 12.0–15.0)
Lymphocytes Relative: 13 % (ref 12–46)
Lymphs Abs: 1 10*3/uL (ref 0.7–4.0)
MCH: 29.1 pg (ref 26.0–34.0)
MCHC: 32.7 g/dL (ref 30.0–36.0)
MCV: 89 fL (ref 78.0–100.0)
MONOS PCT: 12 % (ref 3–12)
Monocytes Absolute: 0.9 10*3/uL (ref 0.1–1.0)
NEUTROS ABS: 5.5 10*3/uL (ref 1.7–7.7)
NEUTROS PCT: 71 % (ref 43–77)
Platelets: 222 10*3/uL (ref 150–400)
RBC: 2.99 MIL/uL — ABNORMAL LOW (ref 3.87–5.11)
RDW: 16.2 % — ABNORMAL HIGH (ref 11.5–15.5)
WBC: 7.6 10*3/uL (ref 4.0–10.5)

## 2014-09-13 LAB — COMPREHENSIVE METABOLIC PANEL
ALK PHOS: 51 U/L (ref 39–117)
ALT: 11 U/L (ref 0–35)
ANION GAP: 16 — AB (ref 5–15)
AST: 15 U/L (ref 0–37)
Albumin: 2.7 g/dL — ABNORMAL LOW (ref 3.5–5.2)
BILIRUBIN TOTAL: 0.8 mg/dL (ref 0.3–1.2)
BUN: 47 mg/dL — AB (ref 6–23)
CHLORIDE: 86 meq/L — AB (ref 96–112)
CO2: 23 mEq/L (ref 19–32)
Calcium: 8.6 mg/dL (ref 8.4–10.5)
Creatinine, Ser: 5.7 mg/dL — ABNORMAL HIGH (ref 0.50–1.10)
GFR calc non Af Amer: 7 mL/min — ABNORMAL LOW (ref >90)
GFR, EST AFRICAN AMERICAN: 8 mL/min — AB (ref >90)
GLUCOSE: 112 mg/dL — AB (ref 70–99)
POTASSIUM: 3.9 meq/L (ref 3.7–5.3)
Sodium: 125 mEq/L — ABNORMAL LOW (ref 137–147)
Total Protein: 7.1 g/dL (ref 6.0–8.3)

## 2014-09-13 LAB — URINE MICROSCOPIC-ADD ON

## 2014-09-13 LAB — I-STAT CG4 LACTIC ACID, ED
Lactic Acid, Venous: 0.94 mmol/L (ref 0.5–2.2)
Lactic Acid, Venous: 0.32 mmol/L — ABNORMAL LOW (ref 0.5–2.2)

## 2014-09-13 LAB — URINALYSIS, ROUTINE W REFLEX MICROSCOPIC
Glucose, UA: NEGATIVE mg/dL
HGB URINE DIPSTICK: NEGATIVE
Ketones, ur: NEGATIVE mg/dL
Nitrite: POSITIVE — AB
PROTEIN: NEGATIVE mg/dL
Specific Gravity, Urine: 1.027 (ref 1.005–1.030)
UROBILINOGEN UA: 1 mg/dL (ref 0.0–1.0)
pH: 5 (ref 5.0–8.0)

## 2014-09-13 LAB — BLOOD GAS, VENOUS
Acid-base deficit: 1.5 mmol/L (ref 0.0–2.0)
Bicarbonate: 24.8 mEq/L — ABNORMAL HIGH (ref 20.0–24.0)
O2 Saturation: 62.4 %
PATIENT TEMPERATURE: 98.6
PH VEN: 7.293 (ref 7.250–7.300)
TCO2: 23.8 mmol/L (ref 0–100)
pCO2, Ven: 52.9 mmHg — ABNORMAL HIGH (ref 45.0–50.0)

## 2014-09-13 LAB — CREATININE, SERUM
Creatinine, Ser: 5.31 mg/dL — ABNORMAL HIGH (ref 0.50–1.10)
GFR calc Af Amer: 9 mL/min — ABNORMAL LOW (ref >90)
GFR, EST NON AFRICAN AMERICAN: 8 mL/min — AB (ref >90)

## 2014-09-13 LAB — PRO B NATRIURETIC PEPTIDE: PRO B NATRI PEPTIDE: 1649 pg/mL — AB (ref 0–125)

## 2014-09-13 LAB — TROPONIN I: Troponin I: <0.3 ng/mL (ref <0.30)

## 2014-09-13 MED ORDER — SODIUM CHLORIDE 0.9 % IV BOLUS (SEPSIS)
1000.0000 mL | INTRAVENOUS | Status: AC
  Administered 2014-09-13 (×4): 1000 mL via INTRAVENOUS

## 2014-09-13 MED ORDER — INSULIN ASPART 100 UNIT/ML ~~LOC~~ SOLN
0.0000 [IU] | SUBCUTANEOUS | Status: DC
  Administered 2014-09-14 (×5): 4 [IU] via SUBCUTANEOUS
  Administered 2014-09-14: 7 [IU] via SUBCUTANEOUS
  Administered 2014-09-15 – 2014-09-16 (×9): 4 [IU] via SUBCUTANEOUS
  Administered 2014-09-16: 3 [IU] via SUBCUTANEOUS
  Administered 2014-09-16 (×3): 4 [IU] via SUBCUTANEOUS
  Administered 2014-09-17: 7 [IU] via SUBCUTANEOUS
  Administered 2014-09-17 – 2014-09-20 (×21): 4 [IU] via SUBCUTANEOUS
  Administered 2014-09-21 (×2): 7 [IU] via SUBCUTANEOUS
  Administered 2014-09-21: 4 [IU] via SUBCUTANEOUS
  Administered 2014-09-21 – 2014-09-22 (×7): 7 [IU] via SUBCUTANEOUS

## 2014-09-13 MED ORDER — TRAMADOL HCL 50 MG PO TABS: 50.0000 mg | ORAL_TABLET | ORAL | Status: DC | PRN

## 2014-09-13 MED ORDER — HYDROCORTISONE NA SUCCINATE PF 100 MG IJ SOLR
250.0000 mg | Freq: Once | INTRAMUSCULAR | Status: AC
  Administered 2014-09-13: 250 mg via INTRAVENOUS
  Filled 2014-09-13: qty 6

## 2014-09-13 MED ORDER — GABAPENTIN 100 MG PO CAPS
100.0000 mg | ORAL_CAPSULE | Freq: Two times a day (BID) | ORAL | Status: DC
  Administered 2014-09-13 – 2014-09-24 (×22): 100 mg via ORAL
  Filled 2014-09-13 (×23): qty 1

## 2014-09-13 MED ORDER — SODIUM CHLORIDE 0.9 % IV SOLN
250.0000 mL | INTRAVENOUS | Status: DC | PRN
Start: 2014-09-13 — End: 2014-09-14

## 2014-09-13 MED ORDER — HYDROCORTISONE NA SUCCINATE PF 100 MG IJ SOLR
100.0000 mg | Freq: Three times a day (TID) | INTRAMUSCULAR | Status: DC
  Administered 2014-09-14 – 2014-09-22 (×26): 100 mg via INTRAVENOUS
  Filled 2014-09-13 (×28): qty 2

## 2014-09-13 MED ORDER — PIPERACILLIN-TAZOBACTAM 3.375 G IVPB 30 MIN
3.3750 g | Freq: Once | INTRAVENOUS | Status: AC
  Administered 2014-09-13: 3.375 g via INTRAVENOUS
  Filled 2014-09-13: qty 50

## 2014-09-13 MED ORDER — OXYCODONE-ACETAMINOPHEN 5-325 MG PO TABS: 1.0000 | ORAL_TABLET | ORAL | Status: DC | PRN

## 2014-09-13 MED ORDER — VANCOMYCIN HCL IN DEXTROSE 1-5 GM/200ML-% IV SOLN
1000.0000 mg | Freq: Once | INTRAVENOUS | Status: AC
  Administered 2014-09-13: 1000 mg via INTRAVENOUS
  Filled 2014-09-13: qty 200

## 2014-09-13 MED ORDER — ASPIRIN EC 81 MG PO TBEC
81.0000 mg | DELAYED_RELEASE_TABLET | Freq: Every day | ORAL | Status: DC
  Administered 2014-09-14 – 2014-09-24 (×11): 81 mg via ORAL
  Filled 2014-09-13 (×14): qty 1

## 2014-09-13 MED ORDER — PIPERACILLIN-TAZOBACTAM IN DEX 2-0.25 GM/50ML IV SOLN
2.2500 g | Freq: Three times a day (TID) | INTRAVENOUS | Status: DC
  Administered 2014-09-14 (×2): 2.25 g via INTRAVENOUS
  Filled 2014-09-13 (×3): qty 50

## 2014-09-13 MED ORDER — SODIUM CHLORIDE 0.9 % IV BOLUS (SEPSIS)
500.0000 mL | INTRAVENOUS | Status: AC
  Administered 2014-09-13 (×3): 500 mL via INTRAVENOUS

## 2014-09-13 MED ORDER — ESCITALOPRAM OXALATE 20 MG PO TABS
20.0000 mg | ORAL_TABLET | Freq: Every day | ORAL | Status: DC
  Administered 2014-09-13 – 2014-09-24 (×12): 20 mg via ORAL
  Filled 2014-09-13 (×11): qty 1

## 2014-09-13 MED ORDER — SODIUM CHLORIDE 0.9 % IV SOLN: INTRAVENOUS | Status: DC

## 2014-09-13 MED ORDER — SODIUM CHLORIDE 0.9 % IV SOLN
2000.0000 mg | INTRAVENOUS | Status: DC
  Administered 2014-09-15: 2000 mg via INTRAVENOUS
  Filled 2014-09-13: qty 2000

## 2014-09-13 MED ORDER — LEVOTHYROXINE SODIUM 175 MCG PO TABS
175.0000 ug | ORAL_TABLET | Freq: Every day | ORAL | Status: DC
  Administered 2014-09-14 – 2014-09-24 (×11): 175 ug via ORAL
  Filled 2014-09-13 (×2): qty 2
  Filled 2014-09-13 (×6): qty 1
  Filled 2014-09-13: qty 2
  Filled 2014-09-13 (×2): qty 1
  Filled 2014-09-13: qty 2
  Filled 2014-09-13 (×6): qty 1
  Filled 2014-09-13: qty 2
  Filled 2014-09-13 (×3): qty 1
  Filled 2014-09-13 (×2): qty 2

## 2014-09-13 MED ORDER — HEPARIN SODIUM (PORCINE) 5000 UNIT/ML IJ SOLN
5000.0000 [IU] | Freq: Three times a day (TID) | INTRAMUSCULAR | Status: DC
  Administered 2014-09-13 – 2014-09-24 (×32): 5000 [IU] via SUBCUTANEOUS
  Filled 2014-09-13 (×35): qty 1

## 2014-09-13 NOTE — ED Provider Notes (Addendum)
CSN: [id number]     Arrival date & time 09/13/14  1518 History   First MD Initiated Contact with Patient 09/13/14 1525     Chief Complaint  Patient presents with  . Fall    without injury c/o genaralized weakness      (Consider location/radiation/quality/duration/timing/severity/associated sxs/prior Treatment) HPI Comments: Patient brought to the emergency department for evaluation of generalized weakness. Patient reports that she has not been feeling well for several days. She has been experiencing chills, sweats, generalized aching pain. She reports a sore throat. She did have nausea and vomited one time last night. No diarrhea. She has not noticed any chest pain, shortness of breath, cough. Patient had a fall earlier. She reports that she from the bedside when trying to stand up. She reports that she has been so weak that her legs cannot hold her.  Patient is a 63 y.o. female presenting with fall.  Fall Pertinent negatives include no abdominal pain.    Past Medical History  Diagnosis Date  . Hypertension   . Diabetes mellitus without complication   . Obesity   . Hypothyroid   . Edema    Past Surgical History  Procedure Laterality Date  . Pituitary surgery  1984  . Back surgery    . Breast surgery      breast reduction   History reviewed. No pertinent family history. History  Substance Use Topics  . Smoking status: Not on file  . Smokeless tobacco: Not on file  . Alcohol Use: No   OB History   Grav Para Term Preterm Abortions TAB SAB Ect Mult Living                 Review of Systems  Constitutional: Positive for fever, chills and fatigue.  Gastrointestinal: Positive for nausea and vomiting. Negative for abdominal pain.  Musculoskeletal: Positive for myalgias.      Allergies  Dye fdc red and Statins  Home Medications   Prior to Admission medications   Medication Sig Start Date End Date Taking? Authorizing Provider  atenolol (TENORMIN) 100 MG tablet Take  100 mg by mouth daily with breakfast.   Yes Historical Provider, MD  escitalopram (LEXAPRO) 20 MG tablet Take 20 mg by mouth daily.   Yes Historical Provider, MD  gabapentin (NEURONTIN) 100 MG capsule Take 50-100 mg by mouth 2 (two) times daily.   Yes Historical Provider, MD  hydrochlorothiazide (HYDRODIURIL) 50 MG tablet Take 50 mg by mouth daily with breakfast.   Yes Historical Provider, MD  hydrocortisone (CORTEF) 10 MG tablet Take 10 mg by mouth daily with breakfast.   Yes Historical Provider, MD  hydrocortisone (CORTEF) 20 MG tablet Take 20 mg by mouth daily with breakfast.   Yes Historical Provider, MD  levothyroxine (SYNTHROID, LEVOTHROID) 175 MCG tablet Take 175 mcg by mouth daily before breakfast.   Yes Historical Provider, MD  nabumetone (RELAFEN) 500 MG tablet Take 500 mg by mouth every 4 (four) hours as needed for mild pain.   Yes Historical Provider, MD  oxyCODONE-acetaminophen (PERCOCET/ROXICET) 5-325 MG per tablet Take 1 tablet by mouth every 4 (four) hours as needed for severe pain.   Yes Historical Provider, MD  pioglitazone (ACTOS) 45 MG tablet Take 22.5 mg by mouth at bedtime.   Yes Historical Provider, MD  Pumpkin Seed-Soy Germ (AZO BLADDER CONTROL/GO-LESS) CAPS Take 1 capsule by mouth 2 (two) times daily.   Yes Historical Provider, MD  quinapril (ACCUPRIL) 40 MG tablet Take 40 mg by mouth daily  with breakfast.   Yes Historical Provider, MD  risperiDONE (RISPERDAL) 1 MG tablet Take 1 mg by mouth at bedtime.   Yes Historical Provider, MD  traMADol (ULTRAM) 50 MG tablet Take 50 mg by mouth every 4 (four) hours as needed for severe pain.   Yes Historical Provider, MD   BP 87/39  Pulse 71  Temp(Src) 98.6 F (37 C) (Core (Comment))  Resp 20  Ht 5\' 2"  (1.575 m)  Wt 411 lb (186.428 kg)  BMI 75.15 kg/m2  SpO2 94% Physical Exam  Constitutional: She is oriented to person, place, and time. She appears well-developed and well-nourished. No distress.  HENT:  Head: Normocephalic and  atraumatic.  Right Ear: Hearing normal.  Left Ear: Hearing normal.  Nose: Nose normal.  Mouth/Throat: Oropharynx is clear and moist. Mucous membranes are dry.  Eyes: Conjunctivae and EOM are normal. Pupils are equal, round, and reactive to light.  Neck: Normal range of motion. Neck supple.  Cardiovascular: Regular rhythm, S1 normal and S2 normal.  Exam reveals no gallop and no friction rub.   No murmur heard. Pulmonary/Chest: Effort normal and breath sounds normal. No respiratory distress. She exhibits no tenderness.  Abdominal: Soft. Normal appearance and bowel sounds are normal. There is no hepatosplenomegaly. There is no tenderness. There is no rebound, no guarding, no tenderness at McBurney's point and negative [name]'s sign. No hernia.  Musculoskeletal: Normal range of motion.  Neurological: She is alert and oriented to person, place, and time. She has normal strength. No cranial nerve deficit or sensory deficit. Coordination normal. GCS eye subscore is 4. GCS verbal subscore is 5. GCS motor subscore is 6.  Skin: Skin is warm, dry and intact. No rash noted. No cyanosis.  Psychiatric: She has a normal mood and affect. Her speech is normal and behavior is normal. Thought content normal.    ED Course  Procedures (including critical care time) Labs Review Labs Reviewed  CBC WITH DIFFERENTIAL - Abnormal; Notable for the following:    RBC 2.99 (*)    Hemoglobin 8.7 (*)    HCT 26.6 (*)    RDW 16.2 (*)    All other components within normal limits  COMPREHENSIVE METABOLIC PANEL - Abnormal; Notable for the following:    Sodium 125 (*)    Chloride 86 (*)    Glucose, Bld 112 (*)    BUN 47 (*)    Creatinine, Ser 5.70 (*)    Albumin 2.7 (*)    GFR calc non Af Amer 7 (*)    GFR calc Af Amer 8 (*)    Anion gap 16 (*)    All other components within normal limits  URINALYSIS, ROUTINE W REFLEX MICROSCOPIC - Abnormal; Notable for the following:    Color, Urine ORANGE (*)    APPearance TURBID  (*)    Bilirubin Urine MODERATE (*)    Nitrite POSITIVE (*)    Leukocytes, UA SMALL (*)    All other components within normal limits  PRO B NATRIURETIC PEPTIDE - Abnormal; Notable for the following:    Pro B Natriuretic peptide (BNP) 1649.0 (*)    All other components within normal limits  BLOOD GAS, VENOUS - Abnormal; Notable for the following:    pCO2, Ven 52.9 (*)    Bicarbonate 24.8 (*)    All other components within normal limits  URINE MICROSCOPIC-ADD ON - Abnormal; Notable for the following:    Bacteria, UA FEW (*)    Casts HYALINE CASTS (*)  All other components within normal limits  CULTURE, BLOOD (ROUTINE X 2)  CULTURE, BLOOD (ROUTINE X 2)  URINE CULTURE  TROPONIN I  INFLUENZA PANEL BY PCR (TYPE A & B, H1N1)  I-STAT CG4 LACTIC ACID, ED    Imaging Review Dg Chest Port 1 View  09/13/2014   CLINICAL DATA:  [place] today; SOB today; pt states that she has been sick for about couple weeks, and today she fell onto her left ankle while she was trying to move; pain in left ankle, pt states that she had previous injury to the left ankle; no N/V, just felt sick; hx HTN; diabetic  EXAM: PORTABLE CHEST - 1 VIEW  COMPARISON:  01/26/2011  FINDINGS: Mild bilateral interstitial thickening. No pleural effusion or pneumothorax. Stable cardiomegaly. Unremarkable osseous structures.  IMPRESSION: Findings concerning for mild CHF.   Electronically Signed   By: Kathreen Devoid   On: 09/13/2014 17:24   Dg Ankle Left Port  09/13/2014   CLINICAL DATA:  63 year old female status post fall at home while trying to transfer into a chair. Initial encounter.  EXAM: PORTABLE LEFT ANKLE - 2 VIEW  COMPARISON:  None.  FINDINGS: Large body habitus and osteopenia. Calcaneus intact. Mortise joint alignment within normal limits and talar dome intact.  There is a corticated chronic appearing osseous fragment at the medial malleolus. However, there is a superimposed lucent acute appearing fracture through the more  proximal right tibia meta diaphysis (arrow on image 1). Minimal displacement. The posterior malleolus appears intact. There appears to be a chronic deformity of the lateral malleolus, no other acute fracture identified.  IMPRESSION: 1. Acute on chronic right tibia medial malleolus fracture, minimally displaced. 2. No other acute fracture or dislocation identified about the left ankle.   Electronically Signed   By: Lars Pinks M.D.   On: 09/13/2014 17:26     EKG Interpretation None      MDM   Final diagnoses:  Fall  Hypotension, unspecified hypotension type  UTI (lower urinary tract infection)  Sepsis, due to unspecified organism  Adrenal insufficiency    Patient presents to the ER for evaluation of generalized weakness, fever and fall. Patient has been ill for the last several days. This reports fever at home. She has not had any specific symptoms that would explain fever, however. Patient was hypotensive on arrival. Patient does have a history of pituitary surgery require hydrocortisone therapy. Adrenal crisis was therefore considered. Please administer stress dose IV hydrocortisone. She was initiated on sepsis for a risk IV fluid replacement, Zosyn and vancomycin. Patient does have evidence of urinary tract infection which may be the source of sepsis and fever. Patient continues to be hypotensive, will require hospitalization in the ICU.  CRITICAL CARE Performed by: Orpah Greek   Total critical care time: 96min  Critical care time was exclusive of separately billable procedures and treating other patients.  Critical care was necessary to treat or prevent imminent or life-threatening deterioration.  Critical care was time spent personally by me on the following activities: development of treatment plan with patient and/or surrogate as well as nursing, discussions with consultants, evaluation of patient's response to treatment, examination of patient, obtaining history from  patient or surrogate, ordering and performing treatments and interventions, ordering and review of laboratory studies, ordering and review of radiographic studies, pulse oximetry and re-evaluation of patient's condition.     Orpah Greek, MD 09/13/14 [place], MD 09/13/14 [phone]

## 2014-09-13 NOTE — H&P (Addendum)
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 Was hypotensive in ED and received several liters NS with persistent low BP. UA revealed 11-20 WBC/HPF raising concern for UTI. PCCM asked to admit for sepsis.  PAST MEDICAL HISTORY :   has a past medical history of Hypertension; Diabetes mellitus without complication; Obesity; Hypothyroid; and Edema.  has past surgical history that includes Pituitary surgery (1984); Back surgery; and Breast surgery. Panhypopituitarism Fibromyalgia syndrome  Prior to Admission medications   Medication Sig Start Date End Date Taking? Authorizing Provider  acetaminophen (TYLENOL) 500 MG tablet Take 1,500 mg by mouth every 6 (six) hours as needed for mild pain or headache.   Yes Historical Provider, MD  aspirin EC 81 MG tablet Take 81 mg by mouth daily with breakfast.   Yes Historical Provider, MD  atenolol (TENORMIN) 100 MG tablet Take 100 mg by mouth daily with breakfast.   Yes Historical Provider, MD  escitalopram (LEXAPRO) 20 MG tablet Take 20 mg by mouth daily.   Yes Historical Provider, MD  gabapentin (NEURONTIN) 100 MG capsule Take 100 mg by mouth 2 (two) times daily.    Yes Historical Provider, MD  hydrochlorothiazide (HYDRODIURIL) 50 MG tablet Take 50 mg by mouth daily with breakfast.    Yes Historical Provider, MD  hydrocortisone (CORTEF) 10 MG tablet Take 15 mg by mouth daily with breakfast.    Yes Historical Provider, MD  hydrocortisone (CORTEF) 20 MG tablet Take 20 mg by mouth every evening.    Yes Historical Provider, MD  Influenza Vac Split Quad (FLUZONE) 0.25 ML injection Inject 0.25 mLs into the muscle once.   Yes Historical Provider, MD  levothyroxine (SYNTHROID, LEVOTHROID) 175 MCG tablet Take 175 mcg by mouth daily before breakfast.   Yes Historical Provider, MD  liver oil-zinc oxide (DESITIN) 40 % ointment Apply 1 application topically as needed for irritation.   Yes Historical Provider, MD  nabumetone (RELAFEN) 500 MG tablet Take 500 mg by mouth every 4 (four) hours as needed for mild pain.   Yes Historical Provider, MD  Neomycin-Bacitracin-Polymyxin (TRIPLE ANTIBIOTIC) [phone] OINT Apply 1 application topically daily as needed (for wound care).   Yes Historical Provider, MD  OVER THE COUNTER MEDICATION Apply 1 application topically daily as needed (for sores). Wound Honey   Yes Historical Provider, MD  oxyCODONE-acetaminophen (PERCOCET/ROXICET) 5-325 MG per tablet Take 1 tablet by mouth every 4 (four) hours as needed for severe pain.   Yes Historical Provider, MD  pioglitazone (ACTOS) 45 MG tablet Take 22.5 mg by mouth at bedtime.   Yes Historical Provider, MD  potassium chloride SA (K-DUR,KLOR-CON) 20 MEQ tablet Take 10-20 mEq by mouth 2 (two) times daily. Takes 2meq in the morning and 59meq at night   Yes Historical Provider,  MD  quinapril (ACCUPRIL) 40 MG tablet Take 40 mg by mouth daily with breakfast.   Yes Historical Provider, MD  risperiDONE (RISPERDAL) 1 MG tablet Take 1 mg by mouth at bedtime.   Yes Historical Provider, MD  traMADol (ULTRAM) 50 MG tablet Take 50 mg by mouth every 4 (four) hours as needed for severe pain.   Yes Historical Provider, MD   Allergies  Allergen Reactions  . Dye Fdc Red [Red Dye] Nausea And Vomiting  . Statins Other (See  Comments)    Makes legs numb     FAMILY HISTORY:  has no family status information on file.  SOCIAL HISTORY:  reports that she does not drink alcohol. Never smoker At baseline, she is severely limited, rarely leaving home and requiring assistance with most ADLs.  Retired/disabled RN  REVIEW OF SYSTEMS:   + HA, + chronic constipation, + chronic LE edema, + frequent falls Denies ST, sinus congestion, otalgia, nuchal rigidity Denies CP, cough, purulent sputum, hemoptysis Denies abdominal pain, diarrhea Denies dysuria, foul urine    SUBJECTIVE:   VITAL SIGNS: Temp:  [98.4 F (36.9 C)-99 F (37.2 C)] 98.7 F (37.1 C) (10/18 1917) Pulse Rate:  [71-73] 73 (10/18 2015) Resp:  [15-23] 19 (10/18 2015) BP: (68-107)/(17-73) 99/40 mmHg (10/18 2015) SpO2:  [94 %-100 %] 100 % (10/18 2015) Weight:  [186.428 kg (411 lb)] 186.428 kg (411 lb) (10/18 1611) HEMODYNAMICS:   VENTILATOR SETTINGS:   INTAKE / OUTPUT:  Intake/Output Summary (Last 24 hours) at 09/13/14 2131 Last data filed at 09/13/14 2054  Gross per 24 hour  Intake   5000 ml  Output    200 ml  Net   4800 ml    PHYSICAL EXAMINATION: General:  Morbidly obese with dystrophic fat distribution, NAD Neuro:  Cognition intact, CNs, motor, sensory intact HEENT:  NCAT, EOMI, PERRL Cardiovascular:  Distant HS, no M noted Lungs: clear anteriorly Abdomen: morbidly obese, soft, NT, distant BS Ext: 1+ symmetric pitting edema  Skin: symmetric erythema and peau d'orange appearance to B flank fat pads   LABS:  CBC  Recent Labs Lab 09/13/14 1545  WBC 7.6  HGB 8.7*  HCT 26.6*  PLT 222   Coag's No results found for this basename: APTT, INR,  in the last 168 hours BMET  Recent Labs Lab 09/13/14 1545 09/13/14 1822  NA 125*  --   K 3.9  --   CL 86*  --   CO2 23  --   BUN 47*  --   CREATININE 5.70* 5.31*  GLUCOSE 112*  --    Electrolytes  Recent Labs Lab 09/13/14 1545  CALCIUM 8.6   Sepsis Markers  Recent  Labs Lab 09/13/14 1602 09/13/14 1831  LATICACIDVEN 0.94 0.32*   ABG No results found for this basename: PHART, PCO2ART, PO2ART,  in the last 168 hours Liver Enzymes  Recent Labs Lab 09/13/14 1545  AST 15  ALT 11  ALKPHOS 51  BILITOT 0.8  ALBUMIN 2.7*   Cardiac Enzymes  Recent Labs Lab 09/13/14 1545 09/13/14 1546  TROPONINI <0.30  --   PROBNP  --  1649.0*   Glucose No results found for this basename: GLUCAP,  in the last 168 hours  CXR: NACPD  ASSESSMENT / PLAN:  PULMONARY A: No issues P:   Supp O2 as needed to maintain SpO2 > 90%  CARDIOVASCULAR A:  Shock - septic and relative hypovolemic Poor venous access Difficulty measuring BP due to upper arm girth P:  Volume  resuscitate per sepsis protocol Might need CVL Consider A line Might require vasopressors  RENAL A:   AKI - suspect due to poor intake, hypovolemia, ACEI, NSAIDS Very poor candidate for dialysis P:   Volume resuscitate Holding quinapril, nabumetone  Monitor BMET intermittently Monitor I/Os Correct electrolytes as indicated  GASTROINTESTINAL A:   Chronic constipation Morbid obesity Body habitus c/w lipodystrophy P:   SUP: N/I Clear liquids. Advance diet as tolerated  HEMATOLOGIC A:   Chronic anemia P:  DVT px: SQ heparin Monitor CBC intermittently Transfuse per usual ICU guidelines  INFECTIOUS A:   Pyuria Presumed severe sepsis P:   Blood 10/18 >>  Urine 10/18 >>   Vanc 10/18 >>  Pip-tazo 10/18 >>   ENDOCRINE A:   Panhypopit DM 2 P:   Stress dose steroids Resistant scale SSI  NEUROLOGIC A:  Chronic severe debiiitation Fibromyalgia syndrome, chronic pain Frequent falls, R ankle fx P:   RASS goal: 0 Cont home doses of percocet, tramadol Ortho consult 10/19   Family updated:  Pt and family updated @ bedside   Interdisciplinary Family Meeting:    TODAY'S SUMMARY:   I have personally obtained a history, examined the patient, evaluated laboratory  and imaging results, formulated the assessment and plan and placed orders. CRITICAL CARE: The patient is critically ill with multiple organ systems failure and requires high complexity decision making for assessment and support, frequent evaluation and titration of therapies, application of advanced monitoring technologies and extensive interpretation of multiple databases. Critical Care Time devoted to patient care services described in this note is  minutes.   Merton Border, MD ; [facility] [phone].  After 5:30 PM or weekends, call [phone]  Pulmonary and [place] Pager: [phone]  09/13/2014, 9:31 PM

## 2014-09-13 NOTE — ED Notes (Signed)
EMS called to home to assist getting patient up post fall. EMS on states patient c/o generalized weakness x several days. No fever. Patient states she was having left ankle pain.

## 2014-09-13 NOTE — Progress Notes (Signed)
ANTIBIOTIC CONSULT NOTE - INITIAL  Pharmacy Consult for Vancomycin and Zosyn Indication: rule out sepsis  Allergies  Allergen Reactions  . Dye Fdc Red [Red Dye]   . Statins     Patient Measurements: Height: 5\' 2"  (157.5 cm) Weight: 411 lb (186.428 kg) IBW/kg (Calculated) : 50.1  Vital Signs: Temp: 99 F (37.2 C) (10/18 1523) Temp Source: Oral (10/18 1523) BP: 80/26 mmHg (10/18 1645) Pulse Rate: 71 (10/18 1630) Intake/Output from previous day:   Intake/Output from this shift:    Labs:  Recent Labs  09/13/14 1545  WBC 7.6  HGB 8.7*  PLT 222  CREATININE 5.70*   Estimated Creatinine Clearance: 17.1 ml/min (by C-G formula based on Cr of 5.7). No results found for this basename: VANCOTROUGH, VANCOPEAK, VANCORANDOM, GENTTROUGH, GENTPEAK, GENTRANDOM, TOBRATROUGH, TOBRAPEAK, TOBRARND, AMIKACINPEAK, AMIKACINTROU, AMIKACIN,  in the last 72 hours   Microbiology: No results found for this or any previous visit (from the past 720 hour(s)).  Medical History: Past Medical History  Diagnosis Date  . Hypertension   . Diabetes mellitus without complication   . Obesity   . Hypothyroid   . Edema     Medications:   (Not in a hospital admission) Assessment: 63yo F presents after falling at home. Reports generalized weakness x several days, chills, sweats, generalized aching pain, sore throat, and episode of nausea/vomiting on 10/17. Hypotensive in the ED. Code sepsis is activated and pharmacy is asked to dose Vanc and Zosyn.  10/18 >> Zosyn >> 10/18 >> Vanc >>    Tmax: 99 WBCs: wnl Renal: SCr 5.7, CrCl 17CG, 12N  10/18 blood x2: sent 10/18 urine: sent   Goal of Therapy:  Vancomycin trough level 15-20 mcg/ml Appropriate antibiotic dosing for renal function; eradication of infection  Plan:   Vancomycin 2g total loading dose today then 2g IV q48h starting 10/20.  Zosyn 2.25g IV q8h (following 3.375g given in ED).  Measure Vanc trough at steady state.  Follow up  renal fxn and culture results.  Romeo Rabon, PharmD, pager [phone]. 09/13/2014,5:06 PM.

## 2014-09-13 NOTE — ED Notes (Signed)
Bed: RESB Expected date:  Expected time:  Means of arrival:  Comments: 

## 2014-09-13 NOTE — Progress Notes (Signed)
ABG results given to Dr. Betsey Holiday. PH 7.29. PCO2 52.9, PO2 below reportable range, HCO3 24.8

## 2014-09-14 ENCOUNTER — Encounter (HOSPITAL_COMMUNITY): Payer: Self-pay

## 2014-09-14 ENCOUNTER — Inpatient Hospital Stay (HOSPITAL_COMMUNITY): Payer: 59

## 2014-09-14 DIAGNOSIS — N179 Acute kidney failure, unspecified: Secondary | ICD-10-CM | POA: Diagnosis present

## 2014-09-14 DIAGNOSIS — N390 Urinary tract infection, site not specified: Secondary | ICD-10-CM

## 2014-09-14 LAB — GLUCOSE, CAPILLARY
GLUCOSE-CAPILLARY: 175 mg/dL — AB (ref 70–99)
GLUCOSE-CAPILLARY: 183 mg/dL — AB (ref 70–99)
Glucose-Capillary: 153 mg/dL — ABNORMAL HIGH (ref 70–99)
Glucose-Capillary: 162 mg/dL — ABNORMAL HIGH (ref 70–99)
Glucose-Capillary: 165 mg/dL — ABNORMAL HIGH (ref 70–99)
Glucose-Capillary: 182 mg/dL — ABNORMAL HIGH (ref 70–99)
Glucose-Capillary: 202 mg/dL — ABNORMAL HIGH (ref 70–99)

## 2014-09-14 LAB — BASIC METABOLIC PANEL
ANION GAP: 20 — AB (ref 5–15)
BUN: 45 mg/dL — ABNORMAL HIGH (ref 6–23)
CHLORIDE: 88 meq/L — AB (ref 96–112)
CO2: 18 mEq/L — ABNORMAL LOW (ref 19–32)
Calcium: 8.1 mg/dL — ABNORMAL LOW (ref 8.4–10.5)
Creatinine, Ser: 4.44 mg/dL — ABNORMAL HIGH (ref 0.50–1.10)
GFR calc non Af Amer: 10 mL/min — ABNORMAL LOW (ref >90)
GFR, EST AFRICAN AMERICAN: 11 mL/min — AB (ref >90)
Glucose, Bld: 174 mg/dL — ABNORMAL HIGH (ref 70–99)
POTASSIUM: 3.8 meq/L (ref 3.7–5.3)
Sodium: 126 mEq/L — ABNORMAL LOW (ref 137–147)

## 2014-09-14 LAB — CBC
HCT: 25 % — ABNORMAL LOW (ref 36.0–46.0)
Hemoglobin: 8.1 g/dL — ABNORMAL LOW (ref 12.0–15.0)
MCH: 28.7 pg (ref 26.0–34.0)
MCHC: 32.4 g/dL (ref 30.0–36.0)
MCV: 88.7 fL (ref 78.0–100.0)
PLATELETS: 213 10*3/uL (ref 150–400)
RBC: 2.82 MIL/uL — AB (ref 3.87–5.11)
RDW: 16 % — ABNORMAL HIGH (ref 11.5–15.5)
WBC: 7.9 10*3/uL (ref 4.0–10.5)

## 2014-09-14 LAB — INFLUENZA PANEL BY PCR (TYPE A & B)
H1N1 flu by pcr: NOT DETECTED
INFLAPCR: NEGATIVE
Influenza B By PCR: NEGATIVE

## 2014-09-14 LAB — MRSA PCR SCREENING: MRSA by PCR: NEGATIVE

## 2014-09-14 LAB — PROCALCITONIN: Procalcitonin: 0.36 ng/mL

## 2014-09-14 MED ORDER — SENNOSIDES-DOCUSATE SODIUM 8.6-50 MG PO TABS
2.0000 | ORAL_TABLET | Freq: Two times a day (BID) | ORAL | Status: DC
  Administered 2014-09-14 – 2014-09-22 (×5): 2 via ORAL
  Administered 2014-09-24: 1 via ORAL
  Filled 2014-09-14 (×16): qty 2

## 2014-09-14 MED ORDER — ROPINIROLE HCL 0.5 MG PO TABS
0.5000 mg | ORAL_TABLET | Freq: Two times a day (BID) | ORAL | Status: DC
  Filled 2014-09-14: qty 1

## 2014-09-14 MED ORDER — ROPINIROLE HCL 1 MG PO TABS
1.0000 mg | ORAL_TABLET | Freq: Two times a day (BID) | ORAL | Status: DC
  Administered 2014-09-14 – 2014-09-24 (×19): 1 mg via ORAL
  Filled 2014-09-14 (×23): qty 1

## 2014-09-14 MED ORDER — DEXTROSE-NACL 5-0.9 % IV SOLN
INTRAVENOUS | Status: DC
  Administered 2014-09-14: 75 mL/h via INTRAVENOUS
  Administered 2014-09-16 – 2014-09-17 (×2): via INTRAVENOUS

## 2014-09-14 MED ORDER — PIPERACILLIN-TAZOBACTAM 3.375 G IVPB
3.3750 g | Freq: Three times a day (TID) | INTRAVENOUS | Status: DC
  Administered 2014-09-14 – 2014-09-20 (×18): 3.375 g via INTRAVENOUS
  Filled 2014-09-14 (×20): qty 50

## 2014-09-14 MED ORDER — TRAMADOL HCL 50 MG PO TABS
50.0000 mg | ORAL_TABLET | Freq: Four times a day (QID) | ORAL | Status: DC | PRN
  Administered 2014-09-14: 50 mg via ORAL
  Filled 2014-09-14 (×2): qty 1

## 2014-09-14 MED ORDER — TRAMADOL HCL 50 MG PO TABS: 50.0000 mg | ORAL_TABLET | ORAL | Status: DC | PRN

## 2014-09-14 MED ORDER — SODIUM CHLORIDE 0.9 % IV BOLUS (SEPSIS)
500.0000 mL | Freq: Once | INTRAVENOUS | Status: AC
  Administered 2014-09-14: 500 mL via INTRAVENOUS

## 2014-09-14 NOTE — Progress Notes (Signed)
Dr. Nelda Marseille notified of blood pressure readings (see docflowsheet for details). Patient alert/oriented x4, denies headache, urine output adequate. Continue to monitor neuro & urine output, as long as patient mentation appropriate and making good urine; no interventions at this time (per Dr. Nelda Marseille).

## 2014-09-14 NOTE — Progress Notes (Signed)
Pt placed on CPAP QHS via Pt's home equipment and settings.  Pt's home equipment appeared in good operating condition with no cuts or frays in the cord and did not spark when plugged into a red outlet.  Service response called.

## 2014-09-14 NOTE — Consult Note (Signed)
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  Some moisture associated skin damage (IAD) at perineum.  Stage II pressure ulcer at sacrum, POA Wound type:Moisture Associated Skin Damage (IAD) at Perineum, ITD at [facility], LEs, groin and inframammary areas Pressure Ulcer POA: Yes Measurement:Stage II on sacrum is 3cm x 2cm x 0.2cm.   Wound EZM:OQHUT, pink and moist Drainage (amount, consistency, odor) serous drainage from right hip cellulitis where partial thickness tissue loss (ruptured blisters) are present. Periwound:Bilateral hips with orange-peel (peau d'orange) appearance, R>L. Dressing procedure/placement/frequency:I will implement a larger bed for her body habitus (wider) and InterDry Ag+ for the intertriginous dermatitis.  A soft silicone dressing is in place for her sacral pressure ulcer. [place] nursing team will not follow, but will remain available to this patient, the nursing and medical team.  Please re-consult if needed. Thanks, Maudie Flakes, MSN, RN, [place], [place], [place] [phone])

## 2014-09-14 NOTE — Progress Notes (Signed)
[facility] Progress Note Patient Name: Kathy Simpson DOB: 07-06-1951 MRN: [id number]   Date of Service  09/14/2014  HPI/Events of Note  Pt states that she takes requip bid at home (not on her med list). Wants it restarted  eICU Interventions  Will order 0.5mg  bid and have pt bring med in or call her pharmacy to confirm     Intervention Category Minor Interventions: Routine modifications to care plan (e.g. PRN medications for pain, fever)  Nimra Puccinelli S. 09/14/2014, 6:15 PM

## 2014-09-14 NOTE — H&P (Addendum)
PULMONARY / CRITICAL CARE MEDICINE   Name: Kathy Simpson MRN: [id number] DOB: 15-Nov-1951    ADMISSION DATE:  09/13/2014  REFERRING MD :  EDP   INITIAL PRESENTATION:  34 F with multiple medical problem and severe baseline debilitation admitted with a week of malaise, anorexia, AKI, hypotension and a presumptive diagnosis of severe sepsis due to UTI. Fx left ankle from fall.  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 and morbid obesity brought to ED by EMS with one week hx of malaise, anorexia, poor PO intake and suffered a fall with c/o L ankle pain. Was hypotensive in ED and received several liters NS with persistent low BP. UA revealed 11-20 WBC/HPF raising concern for UTI. PCCM asked to admit for sepsis.   SUBJECTIVE:  Awake and alert  VITAL SIGNS: Temp:  [98.4 F (36.9 C)-99 F (37.2 C)] 98.6 F (37 C) (10/19 0800) Pulse Rate:  [71-83] 82 (10/19 0800) Resp:  [15-23] 18 (10/19 0800) BP: (68-107)/(17-73) 94/42 mmHg (10/19 0800) SpO2:  [91 %-100 %] 96 % (10/19 0800) Weight:  [411 lb (186.428 kg)-451 lb (204.572 kg)] 451 lb (204.572 kg) (10/19 0500) HEMODYNAMICS:   VENTILATOR SETTINGS:   INTAKE / OUTPUT:  Intake/Output Summary (Last 24 hours) at 09/14/14 1245 Last data filed at 09/14/14 0800  Gross per 24 hour  Intake   5120 ml  Output   1500 ml  Net   3620 ml    PHYSICAL EXAMINATION: General:  Morbidly obese with dystrophic fat distribution, NAD Neuro:  Cognition intact, CNs, motor, sensory intact, pleasant  HEENT:  NCAT, EOMI, PERRL Cardiovascular:  Distant HS, no M noted Lungs: clear anteriorly, diminished in bases Abdomen: morbidly obese, soft, NT, distant BS Ext: 1+ symmetric pitting edema , left ankle tender Skin: symmetric erythema  .  LABS:  CBC  Recent Labs Lab 09/13/14 1545 09/14/14 0347  WBC 7.6 7.9  HGB 8.7* 8.1*  HCT 26.6* 25.0*  PLT 222 213   Coag's No results found for this basename: APTT, INR,  in the last 168 hours BMET  Recent Labs Lab 09/13/14 1545 09/13/14 1822 09/14/14 0350  NA 125*  --  126*  K 3.9  --  3.8  CL 86*  --  88*  CO2 23  --  18*  BUN 47*  --  45*  CREATININE 5.70* 5.31* 4.44*  GLUCOSE 112*  --  174*   Electrolytes  Recent Labs Lab 09/13/14 1545 09/14/14 0350  CALCIUM 8.6 8.1*   Sepsis Markers  Recent Labs Lab 09/13/14 1602 09/13/14 1831  LATICACIDVEN 0.94 0.32*   ABG No results found for this basename: PHART, PCO2ART, PO2ART,  in the last 168 hours Liver Enzymes  Recent Labs Lab 09/13/14 1545  AST 15  ALT 11  ALKPHOS 51  BILITOT 0.8  ALBUMIN 2.7*   Cardiac Enzymes  Recent Labs Lab 09/13/14 1545 09/13/14 1546  TROPONINI <0.30  --   PROBNP  --  1649.0*   Glucose  Recent Labs Lab 09/13/14 2330 09/14/14 0459 09/14/14 0747  GLUCAP 202* 175* 153*    CXR: NACPD  ASSESSMENT / PLAN:  PULMONARY A: Non compliant with cpap for OSA P:   Supp O2 as needed to maintain SpO2 > 90% Nocturnal AutoSet cpap  CARDIOVASCULAR A:  Shock - septic and relative hypovolemic Poor venous access Difficulty measuring BP due to upper arm girth P:  Volume resuscitate per sepsis protocol Sepsis resolving  RENAL Lab Results  Component Value Date   CREATININE 4.44* 09/14/2014   CREATININE 5.31* 09/13/2014   CREATININE 5.70* 09/13/2014    A:   Acute renal failure - suspect due to poor intake, hypovolemia, ACEI, NSAIDS, UTI Very poor candidate for dialysis P:   Volume resuscitate Holding quinapril, nabumetone  Monitor BMET intermittently Monitor I/Os Correct electrolytes as indicated Place foley  GASTROINTESTINAL A:   Chronic constipation Morbid obesity Body habitus c/w lipodystrophy P:   SUP: N/I Clear liquids. Advance diet as  tolerated  HEMATOLOGIC A:   Chronic anemia P:  DVT px: SQ heparin Monitor CBC intermittently Transfuse per usual ICU guidelines  INFECTIOUS A:   Pyuria Presumed severe sepsis P:   Blood 10/18 >>  Urine 10/18 >>   Vanc 10/18 >>  Pip-tazo 10/18 >>   ENDOCRINE CBG (last 3)   Recent Labs  09/13/14 2330 09/14/14 0459 09/14/14 0747  GLUCAP 202* 175* 153*     A:   Panhypopit DM 2 P:   Stress dose steroids(home cortef ) Resistant scale SSI  NEUROLOGIC A:  Chronic severe debiiitation Fibromyalgia syndrome, chronic pain Frequent falls, R ankle fx P:   RASS goal: 0 Cont home doses of percocet, tramadol Ortho consult 10/19   Family updated:  Pt and family updated @ bedside   Interdisciplinary Family Meeting:    TODAY'S STAFF MD Summar SUMMARY:  Personally evaluated patient independent of NP. Known osa but non compliant. Start Cpap nocturnal. UTI being treated, creatine improved. Cellulitis present ; wil check CK. BP ok on forearm cuff with sbp 90. LActate normal Not on pressors. Creat better.  Will give to [facility] for 09/15/14; D/w Dr Charlies Silvers and PCCM off. I have called wound care consult. Rest per NP  Richardson Landry Minor ACNP [place] Pager [phone] till 3 pm If no answer page [phone] 09/14/2014, 12:53 PM    Dr. Brand Males, M.D., [facility].C.P Pulmonary and Critical Care Medicine Staff Physician [place] [facility] Pager: [phone], If no answer or between  15:00h - 7:00h: call [phone]  09/14/2014 6:29 PM

## 2014-09-15 ENCOUNTER — Inpatient Hospital Stay (HOSPITAL_COMMUNITY): Payer: 59 | Admitting: Anesthesiology

## 2014-09-15 ENCOUNTER — Encounter (HOSPITAL_COMMUNITY): Payer: 59 | Admitting: Anesthesiology

## 2014-09-15 DIAGNOSIS — S82892A Other fracture of left lower leg, initial encounter for closed fracture: Secondary | ICD-10-CM | POA: Diagnosis present

## 2014-09-15 DIAGNOSIS — G4733 Obstructive sleep apnea (adult) (pediatric): Secondary | ICD-10-CM | POA: Diagnosis present

## 2014-09-15 LAB — GLUCOSE, CAPILLARY
GLUCOSE-CAPILLARY: 158 mg/dL — AB (ref 70–99)
GLUCOSE-CAPILLARY: 165 mg/dL — AB (ref 70–99)
GLUCOSE-CAPILLARY: 177 mg/dL — AB (ref 70–99)
Glucose-Capillary: 169 mg/dL — ABNORMAL HIGH (ref 70–99)
Glucose-Capillary: 172 mg/dL — ABNORMAL HIGH (ref 70–99)
Glucose-Capillary: 189 mg/dL — ABNORMAL HIGH (ref 70–99)

## 2014-09-15 LAB — CBC WITH DIFFERENTIAL/PLATELET
BASOS PCT: 0 % (ref 0–1)
Basophils Absolute: 0 10*3/uL (ref 0.0–0.1)
EOS ABS: 0 10*3/uL (ref 0.0–0.7)
EOS PCT: 0 % (ref 0–5)
HCT: 21.7 % — ABNORMAL LOW (ref 36.0–46.0)
Hemoglobin: 7.6 g/dL — ABNORMAL LOW (ref 12.0–15.0)
Lymphocytes Relative: 6 % — ABNORMAL LOW (ref 12–46)
Lymphs Abs: 0.5 10*3/uL — ABNORMAL LOW (ref 0.7–4.0)
MCH: 29.8 pg (ref 26.0–34.0)
MCHC: 35 g/dL (ref 30.0–36.0)
MCV: 85.1 fL (ref 78.0–100.0)
Monocytes Absolute: 0.8 10*3/uL (ref 0.1–1.0)
Monocytes Relative: 9 % (ref 3–12)
Neutro Abs: 7.6 10*3/uL (ref 1.7–7.7)
Neutrophils Relative %: 85 % — ABNORMAL HIGH (ref 43–77)
PLATELETS: 220 10*3/uL (ref 150–400)
RBC: 2.55 MIL/uL — AB (ref 3.87–5.11)
RDW: 15.7 % — AB (ref 11.5–15.5)
WBC: 9 10*3/uL (ref 4.0–10.5)

## 2014-09-15 LAB — BASIC METABOLIC PANEL
Anion gap: 16 — ABNORMAL HIGH (ref 5–15)
BUN: 42 mg/dL — AB (ref 6–23)
CALCIUM: 8.5 mg/dL (ref 8.4–10.5)
CO2: 24 mEq/L (ref 19–32)
CREATININE: 2.69 mg/dL — AB (ref 0.50–1.10)
Chloride: 97 mEq/L (ref 96–112)
GFR, EST AFRICAN AMERICAN: 21 mL/min — AB (ref >90)
GFR, EST NON AFRICAN AMERICAN: 18 mL/min — AB (ref >90)
GLUCOSE: 171 mg/dL — AB (ref 70–99)
Potassium: 3.5 mEq/L — ABNORMAL LOW (ref 3.7–5.3)
Sodium: 137 mEq/L (ref 137–147)

## 2014-09-15 LAB — URINE CULTURE
COLONY COUNT: NO GROWTH
Culture: NO GROWTH

## 2014-09-15 LAB — MAGNESIUM: MAGNESIUM: 2.2 mg/dL (ref 1.5–2.5)

## 2014-09-15 LAB — PROCALCITONIN: PROCALCITONIN: 0.18 ng/mL

## 2014-09-15 LAB — TROPONIN I

## 2014-09-15 LAB — PHOSPHORUS: Phosphorus: 3.5 mg/dL (ref 2.3–4.6)

## 2014-09-15 MED ORDER — URELLE 81 MG PO TABS
1.0000 | ORAL_TABLET | Freq: Four times a day (QID) | ORAL | Status: DC | PRN
  Administered 2014-09-15: 81 mg via ORAL
  Administered 2014-09-16: 1 via ORAL
  Administered 2014-09-17: 12:00:00 via ORAL
  Administered 2014-09-17 – 2014-09-19 (×5): 81 mg via ORAL
  Administered 2014-09-19: 12:00:00 via ORAL
  Administered 2014-09-20: 81 mg via ORAL
  Administered 2014-09-20 (×2): via ORAL
  Administered 2014-09-21: 1 mg via ORAL
  Administered 2014-09-21 – 2014-09-23 (×6): 81 mg via ORAL
  Administered 2014-09-23 – 2014-09-24 (×2): via ORAL
  Administered 2014-09-24: 1 via ORAL
  Filled 2014-09-15 (×19): qty 1

## 2014-09-15 MED ORDER — SODIUM CHLORIDE 0.9 % IV BOLUS (SEPSIS)
500.0000 mL | Freq: Once | INTRAVENOUS | Status: AC
  Administered 2014-09-15: 500 mL via INTRAVENOUS

## 2014-09-15 NOTE — Progress Notes (Signed)
[facility] PROGRESS NOTE  Kathy Simpson JEH:[id number] DOB: 1951-09-01 DOA: 09/13/2014 PCP: [name], MD  Assessment/Plan:  Active Problems:   Shock - Resolved - Question as to whether blood pressure readings were accurate. As such agree with a line. Patient's body habitus makes it difficult to obtain accurate blood pressure readings per EMR reports.  Sepsis - Secondary to urinary tract infection a source - Agree with continuation of broad-spectrum IV, Zosyn and vancomycin  UTI - Urine culture reporting no growth - Although urinalysis showing positive nitrite, leukocytosis, and turbid-appearing urine    Acute renal failure - Improved with IV fluid hydration - Could be secondary to sepsis or hypotension or prerenal    OSA (obstructive sleep apnea) -Agree with continuing home CPAP regimen    Ankle fracture, left - New problem. Reported as acute on chronic. Consulted orthopedic surgeon who will evaluate patient while in house.  Code Status: Full Family Communication: No family at bedside Disposition Plan: Continue step down monitoring while patient hypotensive recorded hypotensive blood pressure values   Consultants:  None  Procedures:  None  Antibiotics:  As listed above  HPI/Subjective: Patient reports feeling better today. Some discomfort at left ankle reported but tolerable  Objective: Filed Vitals:   09/15/14 0832  BP: 116/54  Pulse:   Temp: 99.3 F (37.4 C)  Resp: 20    Intake/Output Summary (Last 24 hours) at 09/15/14 1115 Last data filed at 09/15/14 0900  Gross per 24 hour  Intake   2762 ml  Output   2275 ml  Net    487 ml   Filed Weights   09/13/14 1611 09/14/14 0500 09/15/14 0500  Weight: 186.428 kg (411 lb) 204.572 kg (451 lb) 204.119 kg (450 lb)    Exam:   General:  Patient in no acute distress, alert and awake  Cardiovascular: Regular rate and rhythm, no murmurs rubs  Respiratory: Clear to auscultation  bilaterally, no increased work of breathing, no wheezes  Abdomen: Soft, obese, nondistended  Musculoskeletal: No cyanosis or clubbing   Data Reviewed: Basic Metabolic Panel:  Recent Labs Lab 09/13/14 1545 09/13/14 1822 09/14/14 0350 09/15/14 0515  NA 125*  --  126* 137  K 3.9  --  3.8 3.5*  CL 86*  --  88* 97  CO2 23  --  18* 24  GLUCOSE 112*  --  174* 171*  BUN 47*  --  45* 42*  CREATININE 5.70* 5.31* 4.44* 2.69*  CALCIUM 8.6  --  8.1* 8.5  MG  --   --   --  2.2  PHOS  --   --   --  3.5   Liver Function Tests:  Recent Labs Lab 09/13/14 1545  AST 15  ALT 11  ALKPHOS 51  BILITOT 0.8  PROT 7.1  ALBUMIN 2.7*   No results found for this basename: LIPASE, AMYLASE,  in the last 168 hours No results found for this basename: AMMONIA,  in the last 168 hours CBC:  Recent Labs Lab 09/13/14 1545 09/14/14 0347 09/15/14 0515  WBC 7.6 7.9 9.0  NEUTROABS 5.5  --  7.6  HGB 8.7* 8.1* 7.6*  HCT 26.6* 25.0* 21.7*  MCV 89.0 88.7 85.1  PLT 222 213 220   Cardiac Enzymes:  Recent Labs Lab 09/13/14 1545 09/15/14 0515  TROPONINI <0.30 <0.30   BNP (last 3 results)  Recent Labs  09/13/14 1546  PROBNP 1649.0*   CBG:  Recent Labs Lab 09/14/14 1740 09/14/14 1937 09/14/14 2341 09/15/14  0358 09/15/14 0821  GLUCAP 162* 165* 182* 169* 177*    Recent Results (from the past 240 hour(s))  CULTURE, BLOOD (ROUTINE X 2)     Status: None   Collection Time    09/13/14  3:43 PM      Result Value Ref Range Status   Specimen Description BLOOD RIGHT ANTECUBITAL   Final   Special Requests BOTTLES DRAWN AEROBIC AND ANAEROBIC 4CC   Final   Culture  Setup Time     Final   Value: 09/13/2014 18:50     Performed at [facility]   Culture     Final   Value:        BLOOD CULTURE RECEIVED NO GROWTH TO DATE CULTURE WILL BE HELD FOR 5 DAYS BEFORE ISSUING A FINAL NEGATIVE REPORT     Performed at [facility]   Report Status PENDING   Incomplete  CULTURE, BLOOD  (ROUTINE X 2)     Status: None   Collection Time    09/13/14  3:52 PM      Result Value Ref Range Status   Specimen Description BLOOD RIGHT FOREARM   Final   Special Requests BOTTLES DRAWN AEROBIC ONLY 3CC   Final   Culture  Setup Time     Final   Value: 09/13/2014 18:50     Performed at [facility]   Culture     Final   Value:        BLOOD CULTURE RECEIVED NO GROWTH TO DATE CULTURE WILL BE HELD FOR 5 DAYS BEFORE ISSUING A FINAL NEGATIVE REPORT     Performed at [facility]   Report Status PENDING   Incomplete  URINE CULTURE     Status: None   Collection Time    09/13/14  4:49 PM      Result Value Ref Range Status   Specimen Description URINE, CATHETERIZED   Final   Special Requests NONE   Final   Culture  Setup Time     Final   Value: 09/14/2014 00:16     Performed at [place]     Final   Value: NO GROWTH     Performed at [facility]   Culture     Final   Value: NO GROWTH     Performed at [facility]   Report Status 09/15/2014 FINAL   Final  MRSA PCR SCREENING     Status: None   Collection Time    09/13/14  9:55 PM      Result Value Ref Range Status   MRSA by PCR NEGATIVE  NEGATIVE Final   Comment:            The GeneXpert MRSA Assay (FDA     approved for NASAL specimens     only), is one component of a     comprehensive MRSA colonization     surveillance program. It is not     intended to diagnose MRSA     infection nor to guide or     monitor treatment for     MRSA infections.     Studies: Dg Chest Port 1 View  09/14/2014   CLINICAL DATA:  Respiratory failure.  Subsequent encounter.  EXAM: PORTABLE CHEST - 1 VIEW  COMPARISON:  09/13/2014; 01/26/2011; 01/24/2011  FINDINGS: Examination is degraded due to portable technique and patient body habitus.  Grossly unchanged enlarged cardiac silhouette and mediastinal contours. Pulmonary vasculature  appears less distinct with cephalization flow. Slight worsening  of bibasilar heterogeneous opacities, right greater than left. No definite pleural effusion or pneumothorax. Unchanged bones.  IMPRESSION: Slight worsening of pulmonary edema and bibasilar opacities, right greater than left, atelectasis versus infiltrate. Further evaluation with a PA and lateral chest radiograph may be obtained as clinically indicated.   Electronically Signed   By: Sandi Mariscal M.D.   On: 09/14/2014 07:46   Dg Chest Port 1 View  09/13/2014   CLINICAL DATA:  [place] today; SOB today; pt states that she has been sick for about couple weeks, and today she fell onto her left ankle while she was trying to move; pain in left ankle, pt states that she had previous injury to the left ankle; no N/V, just felt sick; hx HTN; diabetic  EXAM: PORTABLE CHEST - 1 VIEW  COMPARISON:  01/26/2011  FINDINGS: Mild bilateral interstitial thickening. No pleural effusion or pneumothorax. Stable cardiomegaly. Unremarkable osseous structures.  IMPRESSION: Findings concerning for mild CHF.   Electronically Signed   By: Kathreen Devoid   On: 09/13/2014 17:24   Dg Ankle Left Port  09/13/2014   CLINICAL DATA:  63 year old female status post fall at home while trying to transfer into a chair. Initial encounter.  EXAM: PORTABLE LEFT ANKLE - 2 VIEW  COMPARISON:  None.  FINDINGS: Large body habitus and osteopenia. Calcaneus intact. Mortise joint alignment within normal limits and talar dome intact.  There is a corticated chronic appearing osseous fragment at the medial malleolus. However, there is a superimposed lucent acute appearing fracture through the more proximal right tibia meta diaphysis (arrow on image 1). Minimal displacement. The posterior malleolus appears intact. There appears to be a chronic deformity of the lateral malleolus, no other acute fracture identified.  IMPRESSION: 1. Acute on chronic right tibia medial malleolus fracture, minimally displaced. 2. No other acute fracture or dislocation identified about the  left ankle.   Electronically Signed   By: Lars Pinks M.D.   On: 09/13/2014 17:26    Scheduled Meds: . aspirin EC  81 mg Oral Q breakfast  . escitalopram  20 mg Oral Daily  . gabapentin  100 mg Oral BID  . heparin  5,000 Units Subcutaneous 3 times per day  . hydrocortisone sod succinate (SOLU-CORTEF) inj  100 mg Intravenous Q8H  . insulin aspart  0-20 Units Subcutaneous 6 times per day  . levothyroxine  175 mcg Oral QAC breakfast  . piperacillin-tazobactam (ZOSYN)  IV  3.375 g Intravenous Q8H  . rOPINIRole  1 mg Oral BID  . senna-docusate  2 tablet Oral BID  . vancomycin  2,000 mg Intravenous Q48H   Continuous Infusions: . dextrose 5 % and 0.9% NaCl 75 mL/hr (09/14/14 2117)    Time spent: > 35 minutes    Velvet Bathe  [facility] Pager [phone] If 7PM-7AM, please contact night-coverage at [email or web address], password [facility] 09/15/2014, 11:15 AM  LOS: 2 days

## 2014-09-15 NOTE — Progress Notes (Signed)
Due to  Respiratory therapists being available at the same time due to Pt's with higher acuity, MD was notified that an MD or anethesia my be required to come insert the Pt's arterial line.  At that time MD made the decision to hold off on the arterial line, because the Pt's B/P had come up and art line insertion was primarily for B/P monitoring and correlation.

## 2014-09-15 NOTE — Clinical Documentation Improvement (Signed)
Presents with Sepsis 2/2 UTI, Septic Shock, Morbid Obesity, hypovolemic documented, ARF. Given fluid resuscitation.   Urine microscopic with hyaline casts  Please further specify the type of ARF the patient has if applicable:  Acute Renal Failure/Acute Kidney Injury - as documented ARF with Acute Tubular Necrosis ARF with Acute Renal Cortical Necrosis ARF with Acute Renal Medullary Necrosis Acute on Chronic Renal Failure Chronic Renal Failure Other Condition   Thank You, Zoila Shutter ,RN Clinical Documentation Specialist:  [place] Information Management

## 2014-09-15 NOTE — Anesthesia Procedure Notes (Signed)
Anesthesia Procedure Note     

## 2014-09-15 NOTE — Consult Note (Signed)
   ORTHOPAEDIC CONSULTATION  REQUESTING PHYSICIAN: Orlando Vega, MD  Chief Complaint: Left ankle injury  HPI: Kathy Simpson is a 63 y.o. female who complains of a mechanical fall on Sunday and left lateral ankle pain. No pain on the medial side.   Past Medical History  Diagnosis Date  . Hypertension   . Diabetes mellitus without complication   . Obesity   . Hypothyroid   . Edema    Past Surgical History  Procedure Laterality Date  . Pituitary surgery  1984  . Back surgery    . Breast surgery      breast reduction   History   Social History  . Marital Status: Married    Spouse Name: N/A    Number of Children: N/A  . Years of Education: N/A   Social History Main Topics  . Smoking status: Never Smoker   . Smokeless tobacco: Never Used  . Alcohol Use: No  . Drug Use: None  . Sexual Activity: None   Other Topics Concern  . None   Social History Narrative  . None   History reviewed. No pertinent family history. Allergies  Allergen Reactions  . Dye Fdc Red [Red Dye] Nausea And Vomiting  . Statins Other (See Comments)    Makes legs numb    Prior to Admission medications   Medication Sig Start Date End Date Taking? Authorizing Provider  acetaminophen (TYLENOL) 500 MG tablet Take 1,500 mg by mouth every 6 (six) hours as needed for mild pain or headache.   Yes Historical Provider, MD  aspirin EC 81 MG tablet Take 81 mg by mouth daily with breakfast.   Yes Historical Provider, MD  atenolol (TENORMIN) 100 MG tablet Take 100 mg by mouth daily with breakfast.   Yes Historical Provider, MD  escitalopram (LEXAPRO) 20 MG tablet Take 20 mg by mouth daily.   Yes Historical Provider, MD  gabapentin (NEURONTIN) 100 MG capsule Take 100 mg by mouth 2 (two) times daily.    Yes Historical Provider, MD  hydrochlorothiazide (HYDRODIURIL) 50 MG tablet Take 50 mg by mouth daily with breakfast.   Yes Historical Provider, MD  hydrocortisone (CORTEF) 10 MG tablet Take 15 mg by mouth  daily with breakfast.    Yes Historical Provider, MD  hydrocortisone (CORTEF) 20 MG tablet Take 20 mg by mouth every evening.    Yes Historical Provider, MD  Influenza Vac Split Quad (FLUZONE) 0.25 ML injection Inject 0.25 mLs into the muscle once.   Yes Historical Provider, MD  levothyroxine (SYNTHROID, LEVOTHROID) 175 MCG tablet Take 175 mcg by mouth daily before breakfast.   Yes Historical Provider, MD  liver oil-zinc oxide (DESITIN) 40 % ointment Apply 1 application topically as needed for irritation.   Yes Historical Provider, MD  nabumetone (RELAFEN) 500 MG tablet Take 500 mg by mouth every 4 (four) hours as needed for mild pain.   Yes Historical Provider, MD  Neomycin-Bacitracin-Polymyxin (TRIPLE ANTIBIOTIC) 3.5-400-5000 OINT Apply 1 application topically daily as needed (for wound care).   Yes Historical Provider, MD  OVER THE COUNTER MEDICATION Apply 1 application topically daily as needed (for sores). Wound Honey   Yes Historical Provider, MD  oxyCODONE-acetaminophen (PERCOCET/ROXICET) 5-325 MG per tablet Take 1 tablet by mouth every 4 (four) hours as needed for severe pain.   Yes Historical Provider, MD  pioglitazone (ACTOS) 45 MG tablet Take 22.5 mg by mouth at bedtime.   Yes Historical Provider, MD  potassium chloride SA (K-DUR,KLOR-CON) 20 MEQ   tablet Take 10-20 mEq by mouth 2 (two) times daily. Takes 20meq in the morning and 10meq at night   Yes Historical Provider, MD  quinapril (ACCUPRIL) 40 MG tablet Take 40 mg by mouth daily with breakfast.   Yes Historical Provider, MD  risperiDONE (RISPERDAL) 1 MG tablet Take 1 mg by mouth at bedtime.   Yes Historical Provider, MD  rOPINIRole (REQUIP) 1 MG tablet Take 1 mg by mouth 2 (two) times daily.   Yes Historical Provider, MD  traMADol (ULTRAM) 50 MG tablet Take 50 mg by mouth every 4 (four) hours as needed for severe pain.   Yes Historical Provider, MD   Dg Chest Port 1 View  09/14/2014   CLINICAL DATA:  Respiratory failure.  Subsequent  encounter.  EXAM: PORTABLE CHEST - 1 VIEW  COMPARISON:  09/13/2014; 01/26/2011; 01/24/2011  FINDINGS: Examination is degraded due to portable technique and patient body habitus.  Grossly unchanged enlarged cardiac silhouette and mediastinal contours. Pulmonary vasculature appears less distinct with cephalization flow. Slight worsening of bibasilar heterogeneous opacities, right greater than left. No definite pleural effusion or pneumothorax. Unchanged bones.  IMPRESSION: Slight worsening of pulmonary edema and bibasilar opacities, right greater than left, atelectasis versus infiltrate. Further evaluation with a PA and lateral chest radiograph may be obtained as clinically indicated.   Electronically Signed   By: John  Watts M.D.   On: 09/14/2014 07:46   Dg Chest Port 1 View  09/13/2014   CLINICAL DATA:  Fell today; SOB today; pt states that she has been sick for about couple weeks, and today she fell onto her left ankle while she was trying to move; pain in left ankle, pt states that she had previous injury to the left ankle; no N/V, just felt sick; hx HTN; diabetic  EXAM: PORTABLE CHEST - 1 VIEW  COMPARISON:  01/26/2011  FINDINGS: Mild bilateral interstitial thickening. No pleural effusion or pneumothorax. Stable cardiomegaly. Unremarkable osseous structures.  IMPRESSION: Findings concerning for mild CHF.   Electronically Signed   By: Hetal  Patel   On: 09/13/2014 17:24   Dg Ankle Left Port  09/13/2014   CLINICAL DATA:  63-year-old female status post fall at home while trying to transfer into a chair. Initial encounter.  EXAM: PORTABLE LEFT ANKLE - 2 VIEW  COMPARISON:  None.  FINDINGS: Large body habitus and osteopenia. Calcaneus intact. Mortise joint alignment within normal limits and talar dome intact.  There is a corticated chronic appearing osseous fragment at the medial malleolus. However, there is a superimposed lucent acute appearing fracture through the more proximal right tibia meta diaphysis  (arrow on image 1). Minimal displacement. The posterior malleolus appears intact. There appears to be a chronic deformity of the lateral malleolus, no other acute fracture identified.  IMPRESSION: 1. Acute on chronic right tibia medial malleolus fracture, minimally displaced. 2. No other acute fracture or dislocation identified about the left ankle.   Electronically Signed   By: Lee  Hall M.D.   On: 09/13/2014 17:26    Positive ROS: All other systems have been reviewed and were otherwise negative with the exception of those mentioned in the HPI and as above.  Labs cbc  Recent Labs  09/14/14 0347 09/15/14 0515  WBC 7.9 9.0  HGB 8.1* 7.6*  HCT 25.0* 21.7*  PLT 213 220    Labs inflam No results found for this basename: ESR, CRP,  in the last 72 hours  Labs coag No results found for this basename: INR, PT, PTT,    in the last 72 hours   Recent Labs  09/14/14 0350 09/15/14 0515  NA 126* 137  K 3.8 3.5*  CL 88* 97  CO2 18* 24  GLUCOSE 174* 171*  BUN 45* 42*  CREATININE 4.44* 2.69*  CALCIUM 8.1* 8.5    Physical Exam: Filed Vitals:   09/15/14 0832  BP: 116/54  Pulse:   Temp: 99.3 F (37.4 C)  Resp: 20   General: Alert, no acute distress Cardiovascular: No pedal edema Respiratory: No cyanosis, no use of accessory musculature GI: No organomegaly, abdomen is soft and non-tender Skin: No lesions in the area of chief complaint other than those listed below in MSK exam.  Neurologic: Sensation intact distally Psychiatric: Patient is competent for consent with normal mood and affect Lymphatic: No axillary or cervical lymphadenopathy  MUSCULOSKELETAL:  LLE: No ttp on medial side, mild TTP on lateral side. SILT DP/SP/S/S/T, +TA/GS/EHL, 2+DP Other extremities are atraumatic with painless ROM and NVI.  Assessment: Left ankle fracture, likely acute on chronic given medial sided changes with no TTP, ankle is well aligned  Plan: Given risk benefit ratio with her overall health  and stable ankle on xray, I recommend non-operative treatment.  Short leg splint for now, xray in 1-2wks and convert to a cast NWB LLE  PT VTE px: SCD's and no orthopedic contraindication to chemical px.  Will likely need placement as she will be NWB for 6wks  Please have her f/u with me in 1-2wks and call with any additional questions. I will sign off at this time.    [name], [name], D, MD Cell [phone]   09/15/2014 11:26 AM     

## 2014-09-16 ENCOUNTER — Encounter (HOSPITAL_COMMUNITY): Payer: Self-pay | Admitting: *Deleted

## 2014-09-16 LAB — BASIC METABOLIC PANEL
Anion gap: 14 (ref 5–15)
BUN: 35 mg/dL — ABNORMAL HIGH (ref 6–23)
CALCIUM: 8.5 mg/dL (ref 8.4–10.5)
CO2: 25 mEq/L (ref 19–32)
CREATININE: 1.64 mg/dL — AB (ref 0.50–1.10)
Chloride: 99 mEq/L (ref 96–112)
GFR calc Af Amer: 38 mL/min — ABNORMAL LOW (ref >90)
GFR calc non Af Amer: 33 mL/min — ABNORMAL LOW (ref >90)
GLUCOSE: 169 mg/dL — AB (ref 70–99)
Potassium: 3.3 mEq/L — ABNORMAL LOW (ref 3.7–5.3)
Sodium: 138 mEq/L (ref 137–147)

## 2014-09-16 LAB — CBC WITH DIFFERENTIAL/PLATELET
Basophils Absolute: 0 10*3/uL (ref 0.0–0.1)
Basophils Relative: 0 % (ref 0–1)
EOS ABS: 0 10*3/uL (ref 0.0–0.7)
Eosinophils Relative: 0 % (ref 0–5)
HEMATOCRIT: 21.5 % — AB (ref 36.0–46.0)
Hemoglobin: 7.3 g/dL — ABNORMAL LOW (ref 12.0–15.0)
LYMPHS PCT: 7 % — AB (ref 12–46)
Lymphs Abs: 0.6 10*3/uL — ABNORMAL LOW (ref 0.7–4.0)
MCH: 29.3 pg (ref 26.0–34.0)
MCHC: 34 g/dL (ref 30.0–36.0)
MCV: 86.3 fL (ref 78.0–100.0)
MONO ABS: 0.6 10*3/uL (ref 0.1–1.0)
Monocytes Relative: 7 % (ref 3–12)
Neutro Abs: 6.6 10*3/uL (ref 1.7–7.7)
Neutrophils Relative %: 86 % — ABNORMAL HIGH (ref 43–77)
Platelets: 256 10*3/uL (ref 150–400)
RBC: 2.49 MIL/uL — ABNORMAL LOW (ref 3.87–5.11)
RDW: 15.9 % — AB (ref 11.5–15.5)
WBC: 7.7 10*3/uL (ref 4.0–10.5)

## 2014-09-16 LAB — GLUCOSE, CAPILLARY
GLUCOSE-CAPILLARY: 190 mg/dL — AB (ref 70–99)
GLUCOSE-CAPILLARY: 168 mg/dL — AB (ref 70–99)
Glucose-Capillary: 173 mg/dL — ABNORMAL HIGH (ref 70–99)
Glucose-Capillary: 137 mg/dL — ABNORMAL HIGH (ref 70–99)
Glucose-Capillary: 184 mg/dL — ABNORMAL HIGH (ref 70–99)
Glucose-Capillary: 172 mg/dL — ABNORMAL HIGH (ref 70–99)

## 2014-09-16 LAB — PROCALCITONIN: Procalcitonin: <0.1 ng/mL

## 2014-09-16 LAB — MAGNESIUM: Magnesium: 2.2 mg/dL (ref 1.5–2.5)

## 2014-09-16 LAB — PHOSPHORUS: Phosphorus: 2.9 mg/dL (ref 2.3–4.6)

## 2014-09-16 MED ORDER — SODIUM CHLORIDE 0.9 % IV SOLN
2000.0000 mg | INTRAVENOUS | Status: DC
  Administered 2014-09-16 – 2014-09-19 (×4): 2000 mg via INTRAVENOUS
  Filled 2014-09-16 (×4): qty 2000

## 2014-09-16 NOTE — Progress Notes (Signed)
ANTIBIOTIC CONSULT NOTE - FOLLOW UP  Pharmacy Consult for Vancomycin/Zosyn Indication: sepsis  Allergies  Allergen Reactions  . Dye Fdc Red [Red Dye] Nausea And Vomiting  . Statins Other (See Comments)    Makes legs numb     Patient Measurements: Height: 5\' 2"  (157.5 cm) Weight: 468 lb 3.2 oz (212.374 kg) IBW/kg (Calculated) : 50.1  Vital Signs: Temp: 99.1 F (37.3 C) (10/21 1200) Temp Source: Core (Comment) (10/21 0800) Intake/Output from previous day: 10/20 0701 - 10/21 0700 In: 2430 [P.O.:480; I.V.:1800; IV Piggyback:150] Out: 1925 [Urine:1925] Intake/Output from this shift: Total I/O In: 425 [I.V.:375; IV Piggyback:50] Out: 590 [Urine:590]  Labs:  Recent Labs  09/14/14 0347 09/14/14 0350 09/15/14 0515 09/16/14 0357  WBC 7.9  --  9.0 7.7  HGB 8.1*  --  7.6* 7.3*  PLT 213  --  220 256  CREATININE  --  4.44* 2.69* 1.64*   Estimated Creatinine Clearance: 65.4 ml/min (by C-G formula based on Cr of 1.64). No results found for this basename: VANCOTROUGH, VANCOPEAK, VANCORANDOM, GENTTROUGH, GENTPEAK, GENTRANDOM, TOBRATROUGH, TOBRAPEAK, TOBRARND, AMIKACINPEAK, AMIKACINTROU, AMIKACIN,  in the last 72 hours     Assessment: 62yo F presents after falling at home. Reports generalized weakness x several days, chills, sweats, generalized aching pain, sore throat, and episode of nausea/vomiting on 10/17. Hypotensive in the ED. Code sepsis activated and pharmacy asked to dose Vanc and Zosyn.  10/18 >> Zosyn >> 10/18 >> Vanc >>   Tmax: 99.5 WBCs: wnl Renal: SCr improving, now 1.64, CrCl 65 ml/min (CG, adjusted weight), 40N  10/18 blood x2: ngtd 10/18 urine: NGF, UA+ 10/18 MRSA screen: neg  Today is day #4 Vanc 2g q48h, Zosyn 3.375g IV q8h (4 hour infusion time) for severe sepsis 2/2 UTI.   Goal of Therapy:  Vancomycin trough level 15-20 mcg/ml Doses adjusted per renal function Eradication of infection  Plan:  1.  Adjust Vancomycin to 2g IV q24h for improved  SCr. 2.  Continue Zosyn 3.375g IV q8h (4 hour infusion time).  3.  F/u SCr, vanc levels as needed, and narrowing of antibiotic therapy.  Hershal Coria 09/16/2014,12:43 PM

## 2014-09-17 LAB — TROPONIN I
Troponin I: <0.3 ng/mL (ref <0.30)
Troponin I: <0.3 ng/mL (ref <0.30)

## 2014-09-17 LAB — CBC WITH DIFFERENTIAL/PLATELET
BASOS PCT: 0 % (ref 0–1)
Basophils Absolute: 0 10*3/uL (ref 0.0–0.1)
EOS ABS: 0 10*3/uL (ref 0.0–0.7)
EOS PCT: 0 % (ref 0–5)
HEMATOCRIT: 21.8 % — AB (ref 36.0–46.0)
HEMOGLOBIN: 7.2 g/dL — AB (ref 12.0–15.0)
LYMPHS PCT: 9 % — AB (ref 12–46)
Lymphs Abs: 0.7 10*3/uL (ref 0.7–4.0)
MCH: 29.4 pg (ref 26.0–34.0)
MCHC: 33 g/dL (ref 30.0–36.0)
MCV: 89 fL (ref 78.0–100.0)
Monocytes Absolute: 0.6 10*3/uL (ref 0.1–1.0)
Monocytes Relative: 8 % (ref 3–12)
Neutro Abs: 6.6 10*3/uL (ref 1.7–7.7)
Neutrophils Relative %: 83 % — ABNORMAL HIGH (ref 43–77)
Platelets: 266 10*3/uL (ref 150–400)
RBC: 2.45 MIL/uL — ABNORMAL LOW (ref 3.87–5.11)
RDW: 16.3 % — ABNORMAL HIGH (ref 11.5–15.5)
WBC: 7.9 10*3/uL (ref 4.0–10.5)

## 2014-09-17 LAB — GLUCOSE, CAPILLARY
GLUCOSE-CAPILLARY: 189 mg/dL — AB (ref 70–99)
GLUCOSE-CAPILLARY: 178 mg/dL — AB (ref 70–99)
GLUCOSE-CAPILLARY: 186 mg/dL — AB (ref 70–99)
GLUCOSE-CAPILLARY: 184 mg/dL — AB (ref 70–99)
Glucose-Capillary: 178 mg/dL — ABNORMAL HIGH (ref 70–99)
Glucose-Capillary: 162 mg/dL — ABNORMAL HIGH (ref 70–99)
Glucose-Capillary: 210 mg/dL — ABNORMAL HIGH (ref 70–99)

## 2014-09-17 LAB — BASIC METABOLIC PANEL
Anion gap: 13 (ref 5–15)
BUN: 30 mg/dL — AB (ref 6–23)
CHLORIDE: 104 meq/L (ref 96–112)
CO2: 24 meq/L (ref 19–32)
Calcium: 8.4 mg/dL (ref 8.4–10.5)
Creatinine, Ser: 1.29 mg/dL — ABNORMAL HIGH (ref 0.50–1.10)
GFR calc Af Amer: 51 mL/min — ABNORMAL LOW (ref >90)
GFR calc non Af Amer: 44 mL/min — ABNORMAL LOW (ref >90)
GLUCOSE: 172 mg/dL — AB (ref 70–99)
POTASSIUM: 3.3 meq/L — AB (ref 3.7–5.3)
Sodium: 141 mEq/L (ref 137–147)

## 2014-09-17 LAB — MAGNESIUM: Magnesium: 2.1 mg/dL (ref 1.5–2.5)

## 2014-09-17 LAB — PHOSPHORUS: Phosphorus: 2.6 mg/dL (ref 2.3–4.6)

## 2014-09-17 MED ORDER — VITAMINS A & D EX OINT
TOPICAL_OINTMENT | CUTANEOUS | Status: AC
  Administered 2014-09-17: 10:00:00
  Filled 2014-09-17: qty 5

## 2014-09-17 NOTE — Progress Notes (Signed)
Placed patient on her home CPAP unit with home settings and 4L 02 bleed in.  Patient tolerating well at this time.

## 2014-09-17 NOTE — Care Management Note (Addendum)
    Page 1 of 2   09/22/2014     10:59:53 AM CARE MANAGEMENT NOTE 09/22/2014  Patient:  Kathy Simpson, Kathy Simpson   Account Number:  [id number]  Date Initiated:  09/14/2014  Documentation initiated by:  DAVIS,RHONDA  Subjective/Objective Assessment:   sepsis due to uti and cellulitis of the lateral and post. hip     Action/Plan:   home when stable   Anticipated DC Date:  09/24/2014   Anticipated DC Plan:  [place]  In-house referral  Clinical Social Worker      DC Planning Services  CM consult      [facility] Choice  NA   Choice offered to / List presented to:  NA      DME agency  NA     [place] arranged  NA      [place] agency  NA   Status of service:  Completed, signed off Medicare Important Message given?  NO (If response is "NO", the following Medicare IM given date fields will be blank) Date Medicare IM given:   Medicare IM given by:   Date Additional Medicare IM given:   Additional Medicare IM given by:    Discharge Disposition:  [place]  Per UR Regulation:  Reviewed for med. necessity/level of care/duration of stay  If discussed at [place] of Stay Meetings, dates discussed:    Comments:  09/22/14 10:55 CM notes CSW arranging for SNF for discharge and no other CM needs were communicated.  Mariane Masters, BSN, [place] [phone].  [id number] Rosana Hoes, RN, BSN, CCM Chart reviewed. Discharge needs and patient's stay to be reviewed and followed by case manager.   [id number] Rosana Hoes, RN, BSN, CCM Chart reviewed. Discharge needs and patient's stay to be reviewed and followed by case manager. Active Problems:   Shock - Resolved - Question as to whether blood pressure readings were accurate. As such agree with a line. Patient's body habitus makes it difficult to obtain accurate blood pressure readings per EMR reports. Sepsis - Secondary to urinary tract infection a source - Agree with continuation of broad-spectrum IV, Zosyn and  vancomycin  [id number] Rosana Hoes, RN, BSN, CCM Chart reviewed. Discharge needs and patient's stay to be reviewed and followed by case manager

## 2014-09-17 NOTE — Progress Notes (Signed)
Pt with 7 beat run of vtach. Midlevel paged awaiting call back.

## 2014-09-17 NOTE — Discharge Instructions (Signed)
No weight on your left foot, keep it elevated for comfort

## 2014-09-18 DIAGNOSIS — S82892A Other fracture of left lower leg, initial encounter for closed fracture: Secondary | ICD-10-CM

## 2014-09-18 DIAGNOSIS — G4733 Obstructive sleep apnea (adult) (pediatric): Secondary | ICD-10-CM

## 2014-09-18 DIAGNOSIS — W19XXXA Unspecified fall, initial encounter: Secondary | ICD-10-CM

## 2014-09-18 LAB — GLUCOSE, CAPILLARY
GLUCOSE-CAPILLARY: 187 mg/dL — AB (ref 70–99)
GLUCOSE-CAPILLARY: 193 mg/dL — AB (ref 70–99)
Glucose-Capillary: 172 mg/dL — ABNORMAL HIGH (ref 70–99)
Glucose-Capillary: 193 mg/dL — ABNORMAL HIGH (ref 70–99)
Glucose-Capillary: 185 mg/dL — ABNORMAL HIGH (ref 70–99)

## 2014-09-18 LAB — CBC WITH DIFFERENTIAL/PLATELET
BASOS ABS: 0 10*3/uL (ref 0.0–0.1)
BASOS PCT: 0 % (ref 0–1)
EOS ABS: 0 10*3/uL (ref 0.0–0.7)
Eosinophils Relative: 0 % (ref 0–5)
HCT: 24.5 % — ABNORMAL LOW (ref 36.0–46.0)
Hemoglobin: 7.8 g/dL — ABNORMAL LOW (ref 12.0–15.0)
LYMPHS PCT: 8 % — AB (ref 12–46)
Lymphs Abs: 0.8 10*3/uL (ref 0.7–4.0)
MCH: 28.8 pg (ref 26.0–34.0)
MCHC: 31.8 g/dL (ref 30.0–36.0)
MCV: 90.4 fL (ref 78.0–100.0)
MONO ABS: 0.7 10*3/uL (ref 0.1–1.0)
Monocytes Relative: 7 % (ref 3–12)
NEUTROS ABS: 8.4 10*3/uL — AB (ref 1.7–7.7)
NEUTROS PCT: 85 % — AB (ref 43–77)
Platelets: 289 10*3/uL (ref 150–400)
RBC: 2.71 MIL/uL — ABNORMAL LOW (ref 3.87–5.11)
RDW: 16.4 % — AB (ref 11.5–15.5)
WBC: 9.9 10*3/uL (ref 4.0–10.5)

## 2014-09-18 LAB — BASIC METABOLIC PANEL
ANION GAP: 11 (ref 5–15)
BUN: 24 mg/dL — AB (ref 6–23)
CALCIUM: 8.3 mg/dL — AB (ref 8.4–10.5)
CO2: 25 mEq/L (ref 19–32)
Chloride: 104 mEq/L (ref 96–112)
Creatinine, Ser: 1.02 mg/dL (ref 0.50–1.10)
GFR calc non Af Amer: 58 mL/min — ABNORMAL LOW (ref >90)
GFR, EST AFRICAN AMERICAN: 67 mL/min — AB (ref >90)
Glucose, Bld: 187 mg/dL — ABNORMAL HIGH (ref 70–99)
Potassium: 3.3 mEq/L — ABNORMAL LOW (ref 3.7–5.3)
Sodium: 140 mEq/L (ref 137–147)

## 2014-09-18 LAB — MAGNESIUM: MAGNESIUM: 2.2 mg/dL (ref 1.5–2.5)

## 2014-09-18 LAB — PHOSPHORUS: PHOSPHORUS: 2.5 mg/dL (ref 2.3–4.6)

## 2014-09-18 MED ORDER — POTASSIUM CHLORIDE CRYS ER 20 MEQ PO TBCR
40.0000 meq | EXTENDED_RELEASE_TABLET | Freq: Once | ORAL | Status: AC
  Administered 2014-09-18: 40 meq via ORAL
  Filled 2014-09-18: qty 2

## 2014-09-18 NOTE — Progress Notes (Signed)
ANTIBIOTIC CONSULT NOTE - FOLLOW UP  Pharmacy Consult for Vancomycin/Zosyn Indication: sepsis  Allergies  Allergen Reactions  . Dye Fdc Red [Red Dye] Nausea And Vomiting  . Statins Other (See Comments)    Makes legs numb     Patient Measurements: Height: 5\' 2"  (157.5 cm) Weight: 473 lb (214.551 kg) IBW/kg (Calculated) : 50.1  Vital Signs: Temp: 98.6 F (37 C) (10/23 1121) Temp Source: Oral (10/23 1121) BP: 137/85 mmHg (10/23 0930) Intake/Output from previous day: 10/22 0701 - 10/23 0700 In: 1887.5 [I.V.:1275; IV Piggyback:612.5] Out: 2426 [Urine:2420; Stool:6] Intake/Output from this shift: Total I/O In: 480 [P.O.:480] Out: -   Labs:  Recent Labs  09/16/14 0357 09/17/14 0304 09/18/14 0432  WBC 7.7 7.9 9.9  HGB 7.3* 7.2* 7.8*  PLT 256 266 289  CREATININE 1.64* 1.29* 1.02   Estimated Creatinine Clearance: 106 ml/min (by C-G formula based on Cr of 1.02). No results found for this basename: VANCOTROUGH, VANCOPEAK, VANCORANDOM, GENTTROUGH, GENTPEAK, GENTRANDOM, TOBRATROUGH, TOBRAPEAK, TOBRARND, AMIKACINPEAK, AMIKACINTROU, AMIKACIN,  in the last 72 hours     Assessment: 63yo F presents after falling at home. Reports generalized weakness x several days, chills, sweats, generalized aching pain, sore throat, and episode of nausea/vomiting on 10/17. Hypotensive in the ED. Code sepsis activated and pharmacy asked to dose Vanc and Zosyn.  10/18 >> Zosyn >> 10/18 >> Vanc >>   Tmax: 99.5 WBCs: wnl Renal: SCr improved to WNL, CrCl >100 CG, 65 N  10/18 blood x2: ngtd 10/18 urine: NGF, UA+ 10/18 MRSA screen: neg  Today is day #6 Vanc 2g q24h, Zosyn 3.375g IV q8h (4 hour infusion time) for severe sepsis 2/2 UTI.   Resolution of azotemia noted.  Given overall clinical improvement and no MRSA or other resistant gram-positive organisms on cultures to date, believe increasing the vancomycin dosage further would only increase the risk of toxicity without providing further  appreciable benefit.  Goal of Therapy:  Vancomycin trough level 15-20 mcg/ml Doses adjusted per renal function Eradication of infection  Plan:  1.  Continue Vancomycin 2g IV q24h. 2.  Continue Zosyn 3.375g IV q8h (4 hour infusion time).  3.  Await word from MD on timing of possible de-escalation of therapy.    Clayburn Pert, PharmD, BCPS Pager: [phone] 09/18/2014  3:04 PM

## 2014-09-18 NOTE — Progress Notes (Signed)
Pt being trasnferred to 6th floor. Pt w/o any s/s of distress.

## 2014-09-18 NOTE — Progress Notes (Signed)
[facility]                                                                              Patient Demographics  Kathy Simpson, is a 63 y.o. female, DOB - [birth year], DEY:[id number]  Admit date - 09/13/2014   Admitting Physician Wilhelmina Mcardle, MD  Outpatient Primary MD for the patient is [name], MD  LOS - 5   Chief Complaint  Patient presents with  . 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 and morbid obesity brought to ED by EMS with one week hx of malaise, anorexia, poor PO intake and suffered a fall with c/o L ankle pain. Was hypotensive in ED and received several liters NS with persistent low BP. UA revealed 11-20 WBC/HPF raising concern for UTI. PCCM asked to admit for sepsis.   Assessment & Plan   Septic shock -Thought to be secondary to urinary tract infection -There is a question of whether blood pressure readings are accurate, patient did have A-line inserted, however that was removed this morning.   -Hypertension does seem to have resolved. -Continue IV antibiotics with vancomycin and Zosyn  Urinary tract infection -Urine culture shows no growth -Patient has no leukocytosis and is currently afebrile -Continue antibiotics as stated above  Acute renal failure -Appears to have resolved with IV fluid hydration -Likely secondary to sepsis including hypotension versus prerenal causes  Obstructive sleep apnea -Continue CPAP each bedtime  Left ankle fracture -Orthopedic surgery consulted and appreciated and did not recommend surgery at this time -Split place -Patient is to followup within one to 2 weeks -No weightbearing on the lower extremity  Physical deconditioning with frequent falls -Will consult PT and OT for evaluation -Patient will likely need placement, will consult social  work  Morbid obesity -This should be addressed by her primary care physician  Chronic anemia -Hemoglobin appears to be at baseline -will continue to monitor CBC -Patient currently hemodynamically stable  Sacral decub, stage II/ hip wound -Will consult wound care  Code Status: Full  Family Communication: None at bedside  Disposition Plan: Admitted, patient is currently stable. Will transfer to medical floor  Time Spent in minutes   30 minutes  Procedures  None  Consults   PCCM Orthopedic surgery  DVT Prophylaxis  heparin  Lab Results  Component Value Date   PLT 289 09/18/2014    Medications  Scheduled Meds: . aspirin EC  81 mg Oral Q breakfast  . escitalopram  20 mg Oral Daily  . gabapentin  100 mg Oral BID  . heparin  5,000 Units Subcutaneous 3 times per day  . hydrocortisone sod succinate (SOLU-CORTEF) inj  100 mg Intravenous Q8H  . insulin aspart  0-20 Units Subcutaneous 6 times per day  . levothyroxine  175 mcg Oral QAC breakfast  . piperacillin-tazobactam (ZOSYN)  IV  3.375 g Intravenous Q8H  . potassium chloride  40 mEq Oral Once  . rOPINIRole  1 mg Oral BID  . senna-docusate  2  tablet Oral BID  . vancomycin  2,000 mg Intravenous Q24H   Continuous Infusions: . dextrose 5 % and 0.9% NaCl 75 mL/hr at 09/17/14 1900   PRN Meds:.oxyCODONE-acetaminophen, traMADol, URELLE  Antibiotics    Anti-infectives   Start     Dose/Rate Route Frequency Ordered Stop   09/16/14 1800  vancomycin (VANCOCIN) 2,000 mg in sodium chloride 0.9 % 500 mL IVPB     2,000 mg 250 mL/hr over 120 Minutes Intravenous Every 24 hours 09/16/14 1252     09/15/14 1800  vancomycin (VANCOCIN) 2,000 mg in sodium chloride 0.9 % 500 mL IVPB  Status:  Discontinued     2,000 mg 250 mL/hr over 120 Minutes Intravenous Every 48 hours 09/13/14 2141 09/16/14 1252   09/14/14 1200  piperacillin-tazobactam (ZOSYN) IVPB 3.375 g     3.375 g 12.5 mL/hr over 240 Minutes Intravenous Every 8 hours 09/14/14  1028     09/13/14 2359  piperacillin-tazobactam (ZOSYN) IVPB 2.25 g  Status:  Discontinued     2.25 g 100 mL/hr over 30 Minutes Intravenous 3 times per day 09/13/14 1702 09/14/14 1028   09/13/14 1800  vancomycin (VANCOCIN) IVPB 1000 mg/200 mL premix     1,000 mg 200 mL/hr over 60 Minutes Intravenous  Once 09/13/14 1701 09/13/14 1920   09/13/14 1600  piperacillin-tazobactam (ZOSYN) IVPB 3.375 g     3.375 g 100 mL/hr over 30 Minutes Intravenous  Once 09/13/14 1559 09/13/14 1645   09/13/14 1600  vancomycin (VANCOCIN) IVPB 1000 mg/200 mL premix     1,000 mg 200 mL/hr over 60 Minutes Intravenous  Once 09/13/14 1559 09/13/14 1745        Subjective:   Kathy Simpson seen and examined today.  Patient feels her breathing is about the same as it has been. She endorses productive cough from time to time. Patient denies any chest pain, abdominal pain, nausea, vomiting.  Objective:   Filed Vitals:   09/18/14 0600 09/18/14 0900 09/18/14 0930 09/18/14 1121  BP:   137/85   Pulse:      Temp: 99.5 F (37.5 C) 98.1 F (36.7 C)  98.6 F (37 C)  TempSrc:  Oral  Oral  Resp: 19     Height:      Weight:      SpO2: 100%       Wt Readings from Last 3 Encounters:  09/18/14 214.551 kg (473 lb)     Intake/Output Summary (Last 24 hours) at 09/18/14 1425 Last data filed at 09/18/14 1249  Gross per 24 hour  Intake   2055 ml  Output   1953 ml  Net    102 ml    Exam  General: Well developed, morbidly obese, NAD, appears stated age  HEENT: NCAT, PERRLA, EOMI, Anicteic Sclera, mucous membranes moist.   Cardiovascular: Distant heart sounds, normal S1-S2   Respiratory: Difficult to auscultate due to body habitus, however clear   Abdomen: Soft, obese, nontender, nondistended, + bowel sounds  Extremities: warm dry without cyanosis clubbing. +edema in ext. Splint/cast in place LLE  Neuro: AAOx3, cranial nerves grossly intact.   Psych: Appropriate mood and affect  Data Review   Micro  Results Recent Results (from the past 240 hour(s))  CULTURE, BLOOD (ROUTINE X 2)     Status: None   Collection Time    09/13/14  3:43 PM      Result Value Ref Range Status   Specimen Description BLOOD RIGHT ANTECUBITAL   Final   Special Requests BOTTLES  DRAWN AEROBIC AND ANAEROBIC 4CC   Final   Culture  Setup Time     Final   Value: 09/13/2014 18:50     Performed at [facility]   Culture     Final   Value:        BLOOD CULTURE RECEIVED NO GROWTH TO DATE CULTURE WILL BE HELD FOR 5 DAYS BEFORE ISSUING A FINAL NEGATIVE REPORT     Performed at [facility]   Report Status PENDING   Incomplete  CULTURE, BLOOD (ROUTINE X 2)     Status: None   Collection Time    09/13/14  3:52 PM      Result Value Ref Range Status   Specimen Description BLOOD RIGHT FOREARM   Final   Special Requests BOTTLES DRAWN AEROBIC ONLY 3CC   Final   Culture  Setup Time     Final   Value: 09/13/2014 18:50     Performed at [facility]   Culture     Final   Value:        BLOOD CULTURE RECEIVED NO GROWTH TO DATE CULTURE WILL BE HELD FOR 5 DAYS BEFORE ISSUING A FINAL NEGATIVE REPORT     Performed at [facility]   Report Status PENDING   Incomplete  URINE CULTURE     Status: None   Collection Time    09/13/14  4:49 PM      Result Value Ref Range Status   Specimen Description URINE, CATHETERIZED   Final   Special Requests NONE   Final   Culture  Setup Time     Final   Value: 09/14/2014 00:16     Performed at [facility]     Final   Value: NO GROWTH     Performed at [facility]   Culture     Final   Value: NO GROWTH     Performed at [facility]   Report Status 09/15/2014 FINAL   Final  MRSA PCR SCREENING     Status: None   Collection Time    09/13/14  9:55 PM      Result Value Ref Range Status   MRSA by PCR NEGATIVE  NEGATIVE Final   Comment:            The GeneXpert MRSA Assay (FDA     approved for NASAL specimens     only), is  one component of a     comprehensive MRSA colonization     surveillance program. It is not     intended to diagnose MRSA     infection nor to guide or     monitor treatment for     MRSA infections.    Radiology Reports Dg Chest Port 1 View  09/14/2014   CLINICAL DATA:  Respiratory failure.  Subsequent encounter.  EXAM: PORTABLE CHEST - 1 VIEW  COMPARISON:  09/13/2014; 01/26/2011; 01/24/2011  FINDINGS: Examination is degraded due to portable technique and patient body habitus.  Grossly unchanged enlarged cardiac silhouette and mediastinal contours. Pulmonary vasculature appears less distinct with cephalization flow. Slight worsening of bibasilar heterogeneous opacities, right greater than left. No definite pleural effusion or pneumothorax. Unchanged bones.  IMPRESSION: Slight worsening of pulmonary edema and bibasilar opacities, right greater than left, atelectasis versus infiltrate. Further evaluation with a PA and lateral chest radiograph may be obtained as clinically indicated.   Electronically Signed   By: Eldridge Abrahams.D.  On: 09/14/2014 07:46   Dg Chest Port 1 View  09/13/2014   CLINICAL DATA:  [place] today; SOB today; pt states that she has been sick for about couple weeks, and today she fell onto her left ankle while she was trying to move; pain in left ankle, pt states that she had previous injury to the left ankle; no N/V, just felt sick; hx HTN; diabetic  EXAM: PORTABLE CHEST - 1 VIEW  COMPARISON:  01/26/2011  FINDINGS: Mild bilateral interstitial thickening. No pleural effusion or pneumothorax. Stable cardiomegaly. Unremarkable osseous structures.  IMPRESSION: Findings concerning for mild CHF.   Electronically Signed   By: Kathreen Devoid   On: 09/13/2014 17:24   Dg Ankle Left Port  09/13/2014   CLINICAL DATA:  63 year old female status post fall at home while trying to transfer into a chair. Initial encounter.  EXAM: PORTABLE LEFT ANKLE - 2 VIEW  COMPARISON:  None.  FINDINGS: Large body  habitus and osteopenia. Calcaneus intact. Mortise joint alignment within normal limits and talar dome intact.  There is a corticated chronic appearing osseous fragment at the medial malleolus. However, there is a superimposed lucent acute appearing fracture through the more proximal right tibia meta diaphysis (arrow on image 1). Minimal displacement. The posterior malleolus appears intact. There appears to be a chronic deformity of the lateral malleolus, no other acute fracture identified.  IMPRESSION: 1. Acute on chronic right tibia medial malleolus fracture, minimally displaced. 2. No other acute fracture or dislocation identified about the left ankle.   Electronically Signed   By: Lars Pinks M.D.   On: 09/13/2014 17:26    CBC  Recent Labs Lab 09/13/14 1545 09/14/14 0347 09/15/14 0515 09/16/14 0357 09/17/14 0304 09/18/14 0432  WBC 7.6 7.9 9.0 7.7 7.9 9.9  HGB 8.7* 8.1* 7.6* 7.3* 7.2* 7.8*  HCT 26.6* 25.0* 21.7* 21.5* 21.8* 24.5*  PLT 222 213 220 256 266 289  MCV 89.0 88.7 85.1 86.3 89.0 90.4  MCH 29.1 28.7 29.8 29.3 29.4 28.8  MCHC 32.7 32.4 35.0 34.0 33.0 31.8  RDW 16.2* 16.0* 15.7* 15.9* 16.3* 16.4*  LYMPHSABS 1.0  --  0.5* 0.6* 0.7 0.8  MONOABS 0.9  --  0.8 0.6 0.6 0.7  EOSABS 0.2  --  0.0 0.0 0.0 0.0  BASOSABS 0.0  --  0.0 0.0 0.0 0.0    Chemistries   Recent Labs Lab 09/13/14 1545  09/14/14 0350 09/15/14 0515 09/16/14 0357 09/17/14 0304 09/18/14 0432  NA 125*  --  126* 137 138 141 140  K 3.9  --  3.8 3.5* 3.3* 3.3* 3.3*  CL 86*  --  88* 97 99 104 104  CO2 23  --  18* 24 25 24 25   GLUCOSE 112*  --  174* 171* 169* 172* 187*  BUN 47*  --  45* 42* 35* 30* 24*  CREATININE 5.70*  < > 4.44* 2.69* 1.64* 1.29* 1.02  CALCIUM 8.6  --  8.1* 8.5 8.5 8.4 8.3*  MG  --   --   --  2.2 2.2 2.1 2.2  AST 15  --   --   --   --   --   --   ALT 11  --   --   --   --   --   --   ALKPHOS 51  --   --   --   --   --   --   BILITOT 0.8  --   --   --   --   --   --   < > =  values in this  interval not displayed. ------------------------------------------------------------------------------------------------------------------ estimated creatinine clearance is 106 ml/min (by C-G formula based on Cr of 1.02). ------------------------------------------------------------------------------------------------------------------ No results found for this basename: HGBA1C,  in the last 72 hours ------------------------------------------------------------------------------------------------------------------ No results found for this basename: CHOL, HDL, LDLCALC, TRIG, CHOLHDL, LDLDIRECT,  in the last 72 hours ------------------------------------------------------------------------------------------------------------------ No results found for this basename: TSH, T4TOTAL, FREET3, T3FREE, THYROIDAB,  in the last 72 hours ------------------------------------------------------------------------------------------------------------------ No results found for this basename: VITAMINB12, FOLATE, FERRITIN, TIBC, IRON, RETICCTPCT,  in the last 72 hours  Coagulation profile No results found for this basename: INR, PROTIME,  in the last 168 hours  No results found for this basename: DDIMER,  in the last 72 hours  Cardiac Enzymes  Recent Labs Lab 09/15/14 0515 09/17/14 0312 09/17/14 1110  TROPONINI <0.30 <0.30 <0.30   ------------------------------------------------------------------------------------------------------------------ No components found with this basename: POCBNP,     Keona Bilyeu D.O. on 09/18/2014 at 2:25 PM  Between 7am to 7pm - Pager - [phone]  After 7pm go to [email or web address] - password [email or web address]  And look for the night coverage person covering for me after hours  [facility] Office  [phone]

## 2014-09-18 NOTE — Progress Notes (Signed)
On assessment, pt's arterial line had bloody drainage. Respiratory therapy asked to change A-line dressing. When respiratory therapy changed dressing, A-line was removed. Last BP reading before Aline was removed was 137/85. RN notified MD Wendee Beavers at [facility] via text page. No response received by RN. No new orders at this time.

## 2014-09-19 DIAGNOSIS — R579 Shock, unspecified: Secondary | ICD-10-CM

## 2014-09-19 DIAGNOSIS — N179 Acute kidney failure, unspecified: Secondary | ICD-10-CM

## 2014-09-19 LAB — GLUCOSE, CAPILLARY
GLUCOSE-CAPILLARY: 174 mg/dL — AB (ref 70–99)
GLUCOSE-CAPILLARY: 175 mg/dL — AB (ref 70–99)
GLUCOSE-CAPILLARY: 191 mg/dL — AB (ref 70–99)
Glucose-Capillary: 154 mg/dL — ABNORMAL HIGH (ref 70–99)
Glucose-Capillary: 186 mg/dL — ABNORMAL HIGH (ref 70–99)
Glucose-Capillary: 195 mg/dL — ABNORMAL HIGH (ref 70–99)

## 2014-09-19 LAB — CBC
HEMATOCRIT: 25.7 % — AB (ref 36.0–46.0)
Hemoglobin: 8.3 g/dL — ABNORMAL LOW (ref 12.0–15.0)
MCH: 29.7 pg (ref 26.0–34.0)
MCHC: 32.3 g/dL (ref 30.0–36.0)
MCV: 92.1 fL (ref 78.0–100.0)
PLATELETS: 275 10*3/uL (ref 150–400)
RBC: 2.79 MIL/uL — ABNORMAL LOW (ref 3.87–5.11)
RDW: 16.6 % — AB (ref 11.5–15.5)
WBC: 9.9 10*3/uL (ref 4.0–10.5)

## 2014-09-19 LAB — CULTURE, BLOOD (ROUTINE X 2)
CULTURE: NO GROWTH
Culture: NO GROWTH

## 2014-09-19 LAB — BASIC METABOLIC PANEL
Anion gap: 12 (ref 5–15)
BUN: 19 mg/dL (ref 6–23)
CALCIUM: 8.4 mg/dL (ref 8.4–10.5)
CO2: 26 mEq/L (ref 19–32)
Chloride: 103 mEq/L (ref 96–112)
Creatinine, Ser: 0.96 mg/dL (ref 0.50–1.10)
GFR calc Af Amer: 72 mL/min — ABNORMAL LOW (ref >90)
GFR calc non Af Amer: 63 mL/min — ABNORMAL LOW (ref >90)
GLUCOSE: 177 mg/dL — AB (ref 70–99)
Potassium: 3.8 mEq/L (ref 3.7–5.3)
Sodium: 141 mEq/L (ref 137–147)

## 2014-09-19 MED ORDER — PERFLUTREN LIPID MICROSPHERE
1.0000 mL | INTRAVENOUS | Status: AC | PRN
  Administered 2014-09-19: 2 mL via INTRAVENOUS
  Filled 2014-09-19: qty 10

## 2014-09-19 MED ORDER — LIP MEDEX EX OINT
TOPICAL_OINTMENT | CUTANEOUS | Status: AC
  Administered 2014-09-19: 15:00:00
  Filled 2014-09-19: qty 7

## 2014-09-19 MED ORDER — FUROSEMIDE 10 MG/ML IJ SOLN
20.0000 mg | Freq: Once | INTRAMUSCULAR | Status: AC
  Administered 2014-09-19: 20 mg via INTRAVENOUS
  Filled 2014-09-19: qty 2

## 2014-09-19 NOTE — Progress Notes (Signed)
Assisted pt. With placing on her home cpap. Pt. Tolerating well at this time.

## 2014-09-19 NOTE — Evaluation (Signed)
Occupational Therapy Evaluation Patient Details Name: Kathy Simpson  MRN: [id number] DOB: 1951-10-14 Today's Date: 09/19/2014    History of Present Illness This 63 year old female was  brought to hospital with one week h/o malaise and fall resulting in L ankle pain.  She was diagnosed with septic shock and uti.  She is NWB and splinted for acute vs chronic tibia/medial malleolus min displaced fx.  Pt has a h/o pituitary tumor, DM, HTN, OSA and morbid obesity   Clinical Impression   Pt was admitted for the above    Follow Up Recommendations  SNF    Equipment Recommendations  Wheelchair (measurements OT);Wheelchair cushion (measurements OT)    Recommendations for Other Services       Precautions / Restrictions Precautions Precautions: Fall Restrictions Weight Bearing Restrictions: Yes LLE Weight Bearing: Non weight bearing      Mobility Bed Mobility Overal bed mobility: +2 for physical assistance Bed Mobility: Rolling Rolling: Mod assist;+2 for physical assistance (rolling to L)   Supine to sit: Min assist;+2 for physical assistance (assist x 3 for safety) Sit to supine: Mod assist (assist x 4 )   General bed mobility comments: assist for bil UEs and trunk. Pt able to push up from supine to sitting with trunk using rail.  Used A x 3-4 to minimize stress on pt.  Transfers                 General transfer comment: not attempted.  Pt attempted to laterally scoot on bed--scooted a little with A x 2 but became fatiqued and returned to supine    Balance Overall balance assessment: History of Falls Sitting-balance support: Bilateral upper extremity supported;Feet supported Sitting balance-Leahy Scale: Fair                                      ADL Overall ADL's : Needs assistance/impaired                                       General ADL Comments: pt is near baseline for UB adls when assisted to sit up.  She usually wears gowns at  home.  Husband assists with LB ADLS and she wears pants and top if going out to church.  Pt was able to get to commode prior to fall and we did not transfer her on this visit.  Attempted to scoot up sideways towards HOB to simulate sliding board.  Pt is NWB on LLE.  Pt tries very hard and fatiques easily     Vision                     Perception     Praxis      Pertinent Vitals/Pain Pain Assessment: Faces Faces Pain Scale: Hurts little more Pain Location: back after sitting up; no c/o LLE pain Pain Intervention(s): Limited activity within patient's tolerance;Monitored during session;Repositioned     Hand Dominance     Extremity/Trunk Assessment Upper Extremity Assessment Upper Extremity Assessment: LUE deficits/detail LUE Deficits / Details: strength grossly 3+/5 to 4-/5 shoulder.  RUE is [employer]           Communication Communication Communication: No difficulties   Cognition Arousal/Alertness: Awake/alert Behavior During Therapy: WFL for tasks assessed/performed Overall Cognitive Status: Within Functional Limits for tasks assessed  General Comments       Exercises       Shoulder Instructions      Home Living Family/patient expects to be discharged to:: Skilled nursing facility                                 Additional Comments: lives with husband has RW, BSC.  Has been trying to get w/c for distances outside of home      Prior Functioning/Environment Level of Independence: Needs assistance        Comments: husband assisted with LB adls.  Pt does have a reacher, but husband usually assisted    OT Diagnosis: Generalized weakness   OT Problem List: Decreased strength;Decreased activity tolerance;Decreased knowledge of use of DME or AE;Obesity;Pain;Impaired balance (sitting and/or standing)   OT Treatment/Interventions: Self-care/ADL training;DME and/or AE instruction;Patient/family education;Balance  training;Therapeutic activities    OT Goals(Current goals can be found in the care plan section) Acute Rehab OT Goals Patient Stated Goal: get stronger and more mobile.  Home after rehab OT Goal Formulation: With patient Time For Goal Achievement: 10/03/14 Potential to Achieve Goals: Good ADL Goals Pt Will Transfer to Toilet: with mod assist;with transfer board;bedside commode;with +2 assist (drop arm, A x 3 for safety) Additional ADL Goal #1: Pt will roll to bil sides with min A and rails for ADLs Additional ADL Goal #2: Pt will tolerate sitting eob x 20 minutes for light adl activities with supervision  OT Frequency: Min 2X/week   Barriers to D/C:            Co-evaluation PT/OT/SLP Co-Evaluation/Treatment: Yes Reason for Co-Treatment: For patient/therapist safety   OT goals addressed during session: ADL's and self-care;Strengthening/ROM      End of Session Nurse Communication:  (ready for IV to be reconnected)  Activity Tolerance: Patient tolerated treatment well Patient left: in bed;with call bell/phone within reach   Time: 1694-5038 OT Time Calculation (min): 42 min Charges:  OT General Charges $OT Visit: 1 Procedure OT Evaluation $Initial OT Evaluation Tier I: 1 Procedure OT Treatments $Therapeutic Activity: 8-22 mins G-Codes:    Peace Noyes 10/11/2014, 4:03 PM Lesle Chris, OTR/L [phone] 10/11/14

## 2014-09-19 NOTE — Progress Notes (Signed)
Pt stated that she wanted her CPAP between 2300 and 0000, Pt to notify RT when ready for bed.  RT to monitor and assess as needed.

## 2014-09-19 NOTE — Progress Notes (Signed)
  Echocardiogram 2D Echocardiogram has been performed.  Bobbye Charleston 09/19/2014, 5:16 PM

## 2014-09-19 NOTE — Evaluation (Signed)
Physical Therapy Evaluation Patient Details Name: Kathy Simpson MRN: [id number] DOB: November 30, 1950 Today's Date: 09/19/2014   History of Present Illness  This 63 year old female was  brought to hospital with one week h/o malaise and fall resulting in L ankle pain.  She was diagnosed with septic shock and uti.  She is NWB and splinted for acute vs chronic tibia/medial malleolus min displaced fx.  Pt has a h/o pituitary tumor, DM, HTN, OSA and morbid obesity  Clinical Impression  Patient presents with decreased independence with mobility due to deficits in PT problem list.  She will benefit from skilled PT in the acute setting to allow return home with family assist following [facility] rehab stay.    Follow Up Recommendations SNF    Equipment Recommendations  Wheelchair (measurements PT);Wheelchair cushion (measurements PT)    Recommendations for Other Services       Precautions / Restrictions Precautions Precautions: Fall Required Braces or Orthoses: Other Brace/Splint Other Brace/Splint: cast left ankle Restrictions Weight Bearing Restrictions: Yes LLE Weight Bearing: Non weight bearing      Mobility  Bed Mobility Overal bed mobility: +2 for physical assistance Bed Mobility: Rolling Rolling: Mod assist;+2 for physical assistance   Supine to sit: +2 for physical assistance;Mod assist;HOB elevated Sit to supine: Max assist (4 person assist)   General bed mobility comments: to sit brought bed to chair position, then assisted legs off bed one at a time, and pt used rail and another person assist to sit up on edge of bed; to supine assist 2 person to slide over and up in bed and 2 person to lift legs  Transfers                 General transfer comment: not attempted.  Pt attempted to laterally scoot on bed--scooted a little with A x 2 but became fatiqued and returned to supine  Ambulation/Gait                Stairs            Wheelchair Mobility     Modified Rankin (Stroke Patients Only)       Balance Overall balance assessment: History of Falls;Needs assistance Sitting-balance support: Bilateral upper extremity supported;Feet supported Sitting balance-Leahy Scale: Good Sitting balance - Comments: sat edge of bed about 4 mintues prior to fatigue                                     Pertinent Vitals/Pain Pain Assessment: Faces Faces Pain Scale: Hurts little more Pain Location: back in sitting, no c/o LE pain Pain Intervention(s): Limited activity within patient's tolerance;Monitored during session    [place] expects to be discharged to:: Skilled nursing facility Living Arrangements: Spouse/significant other               Additional Comments: lives with husband has RW, BSC.  Has been trying to get w/c for distances outside of home    Prior Function Level of Independence: Needs assistance         Comments: husband assisted with LB adls.  Pt does have a reacher, but husband usually assisted     Hand Dominance        Extremity/Trunk Assessment   Upper Extremity Assessment: Defer to OT evaluation       LUE Deficits / Details: strength grossly 3+/5 to 4-/5 shoulder.  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 which limit sitting tolerance due to width LLE Deficits / Details: ankle in cast, weakness evident, assist needed to lift leg     Communication   Communication: No difficulties  Cognition Arousal/Alertness: Awake/alert Behavior During Therapy: WFL for tasks assessed/performed Overall Cognitive Status: Within Functional Limits for tasks assessed                      General Comments      Exercises        Assessment/Plan    PT  Assessment Patient needs continued PT services  PT Diagnosis Generalized weakness   PT Problem List Decreased strength;Decreased activity tolerance;Decreased balance;Decreased mobility;Decreased knowledge of use of DME;Decreased safety awareness  PT Treatment Interventions DME instruction;Therapeutic exercise;Functional mobility training;Therapeutic activities;Patient/family education   PT Goals (Current goals can be found in the Care Plan section) Acute Rehab PT Goals Patient Stated Goal: get stronger and more mobile.  Home after rehab PT Goal Formulation: With patient Time For Goal Achievement: 10/03/14 Potential to Achieve Goals: Good    Frequency Min 3X/week   Barriers to discharge        Co-evaluation PT/OT/SLP Co-Evaluation/Treatment: Yes Reason for Co-Treatment: For patient/therapist safety PT goals addressed during session: Mobility/safety with mobility OT goals addressed during session: ADL's and self-care;Strengthening/ROM       End of Session Equipment Utilized During Treatment: Oxygen Activity Tolerance: Patient limited by fatigue Patient left: with call bell/phone within reach;in bed           Time: 0938-1829 PT Time Calculation (min): 44 min   Charges:   PT Evaluation $Initial PT Evaluation Tier I: 1 Procedure PT Treatments $Therapeutic Activity: 8-22 mins   PT G Codes:          Kiel Cockerell,CYNDI 09-20-14, 4:26 PM Magda Kiel, [place] 09/20/14

## 2014-09-19 NOTE — Progress Notes (Signed)
[facility]                                                                              Patient Demographics  Kathy Simpson, is a 63 y.o. female, DOB - 22-Aug-1951, SWN:[id number]  Admit date - 09/13/2014   Admitting Physician Kathy Mcardle, MD  Outpatient Primary MD for the patient is Kathy Shelling, MD  LOS - 6   Chief Complaint  Patient presents with  . 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 and morbid obesity brought to ED by EMS with one week hx of malaise, anorexia, poor PO intake and suffered a fall with c/o L ankle pain. Was hypotensive in ED and received several liters NS with persistent low BP. UA revealed 11-20 WBC/HPF raising concern for UTI. PCCM asked to admit for sepsis.   Assessment & Plan   Septic shock -Resolved -Thought to be secondary to urinary tract infection -There is a question of whether blood pressure readings are accurate, patient did have A-line inserted, however that was removed this morning.   -Continue IV antibiotics with vancomycin and Zosyn  Urinary tract infection -Urine culture shows no growth -Patient has no leukocytosis and is currently afebrile -Continue antibiotics as stated above  Acute renal failure -Appears to have resolved with IV fluid hydration -Likely secondary to sepsis including hypotension versus prerenal causes  Obstructive sleep apnea -Continue CPAP each bedtime  Dyspnea -Likely multifactorial including body habitus versus body positioning versus possible volume overload -Patient did have an elevated BNP at admission -Will obtain echocardiogram -Will continue to monitor intake and output as well as daily weights -Will continue IV fluids and give patient one dose of Lasix -Patient continues to need supplemental oxygen  Left ankle  fracture -Orthopedic surgery consulted and appreciated and did not recommend surgery at this time -Split place -Patient is to followup within one to 2 weeks -No weightbearing on the lower extremity  Physical deconditioning with frequent falls -Will consult PT and OT for evaluation -Patient will likely need placement, will consult social work  Morbid obesity -This should be addressed by her primary care physician  Chronic anemia -Hemoglobin appears to be at baseline -will continue to monitor CBC -Patient currently hemodynamically stable  Sacral decub, stage II/ hip wound -Wound care consulted  Code Status: Full  Family Communication: Daughter at bedside  Disposition Plan: Admitted, Pending PT/OT evaluation  Time Spent in minutes   30 minutes  Procedures  None  Consults   PCCM Orthopedic surgery  DVT Prophylaxis  Heparin/SCDs  Lab Results  Component Value Date   PLT 275 09/19/2014    Medications  Scheduled Meds: . aspirin EC  81 mg Oral Q breakfast  . escitalopram  20 mg Oral Daily  . gabapentin  100 mg Oral BID  . heparin  5,000 Units Subcutaneous 3 times per day  . hydrocortisone sod succinate (SOLU-CORTEF) inj  100 mg Intravenous Q8H  . insulin aspart  0-20 Units Subcutaneous 6 times per day  .  levothyroxine  175 mcg Oral QAC breakfast  . piperacillin-tazobactam (ZOSYN)  IV  3.375 g Intravenous Q8H  . rOPINIRole  1 mg Oral BID  . senna-docusate  2 tablet Oral BID  . vancomycin  2,000 mg Intravenous Q24H   Continuous Infusions:   PRN Meds:.oxyCODONE-acetaminophen, traMADol, URELLE  Antibiotics    Anti-infectives   Start     Dose/Rate Route Frequency Ordered Stop   09/16/14 1800  vancomycin (VANCOCIN) 2,000 mg in sodium chloride 0.9 % 500 mL IVPB     2,000 mg 250 mL/hr over 120 Minutes Intravenous Every 24 hours 09/16/14 1252     09/15/14 1800  vancomycin (VANCOCIN) 2,000 mg in sodium chloride 0.9 % 500 mL IVPB  Status:  Discontinued     2,000  mg 250 mL/hr over 120 Minutes Intravenous Every 48 hours 09/13/14 2141 09/16/14 1252   09/14/14 1200  piperacillin-tazobactam (ZOSYN) IVPB 3.375 g     3.375 g 12.5 mL/hr over 240 Minutes Intravenous Every 8 hours 09/14/14 1028     09/13/14 2359  piperacillin-tazobactam (ZOSYN) IVPB 2.25 g  Status:  Discontinued     2.25 g 100 mL/hr over 30 Minutes Intravenous 3 times per day 09/13/14 1702 09/14/14 1028   09/13/14 1800  vancomycin (VANCOCIN) IVPB 1000 mg/200 mL premix     1,000 mg 200 mL/hr over 60 Minutes Intravenous  Once 09/13/14 1701 09/13/14 1920   09/13/14 1600  piperacillin-tazobactam (ZOSYN) IVPB 3.375 g     3.375 g 100 mL/hr over 30 Minutes Intravenous  Once 09/13/14 1559 09/13/14 1645   09/13/14 1600  vancomycin (VANCOCIN) IVPB 1000 mg/200 mL premix     1,000 mg 200 mL/hr over 60 Minutes Intravenous  Once 09/13/14 1559 09/13/14 1745        Subjective:   Kathy Simpson seen and examined today.  Patient states she continues to have shortness of breath. She does not wear oxygen at home. Patient denies any abdominal pain, she chest pain, dizziness, headache, nausea or vomiting. She does state that she's had soft stools.  Objective:   Filed Vitals:   09/18/14 2100 09/18/14 2353 09/19/14 0220 09/19/14 0515  BP: 152/57  143/49 138/63  Pulse:   84 79  Temp:   98.5 F (36.9 C) 99.3 F (37.4 C)  TempSrc:   Oral Oral  Resp: 24 18 20 20   Height:      Weight:      SpO2: 100%  100% 94%    Wt Readings from Last 3 Encounters:  09/18/14 214.551 kg (473 lb)     Intake/Output Summary (Last 24 hours) at 09/19/14 1047 Last data filed at 09/19/14 0515  Gross per 24 hour  Intake 2083.75 ml  Output   1700 ml  Net 383.75 ml    Exam  General: Well developed, morbidly obese, NAD, appears stated age  26: NCAT, mucous membranes moist.   Cardiovascular: Distant heart sounds, normal S1-S2   Respiratory: Difficult to auscultate due to body habitus, however clear, unable to  appreciate crackles, no wheezing   Abdomen: Soft, obese, nontender, nondistended, + bowel sounds  Extremities: warm dry without cyanosis clubbing. +edema in ext. Cast in place LLE  Neuro: AAOx3, cranial nerves grossly intact.   Psych: Appropriate mood and affect  Data Review   Micro Results Recent Results (from the past 240 hour(s))  CULTURE, BLOOD (ROUTINE X 2)     Status: None   Collection Time    09/13/14  3:43 PM  Result Value Ref Range Status   Specimen Description BLOOD RIGHT ANTECUBITAL   Final   Special Requests BOTTLES DRAWN AEROBIC AND ANAEROBIC 4CC   Final   Culture  Setup Time     Final   Value: 09/13/2014 18:50     Performed at [facility]   Culture     Final   Value:        BLOOD CULTURE RECEIVED NO GROWTH TO DATE CULTURE WILL BE HELD FOR 5 DAYS BEFORE ISSUING A FINAL NEGATIVE REPORT     Performed at [facility]   Report Status PENDING   Incomplete  CULTURE, BLOOD (ROUTINE X 2)     Status: None   Collection Time    09/13/14  3:52 PM      Result Value Ref Range Status   Specimen Description BLOOD RIGHT FOREARM   Final   Special Requests BOTTLES DRAWN AEROBIC ONLY 3CC   Final   Culture  Setup Time     Final   Value: 09/13/2014 18:50     Performed at [facility]   Culture     Final   Value:        BLOOD CULTURE RECEIVED NO GROWTH TO DATE CULTURE WILL BE HELD FOR 5 DAYS BEFORE ISSUING A FINAL NEGATIVE REPORT     Performed at [facility]   Report Status PENDING   Incomplete  URINE CULTURE     Status: None   Collection Time    09/13/14  4:49 PM      Result Value Ref Range Status   Specimen Description URINE, CATHETERIZED   Final   Special Requests NONE   Final   Culture  Setup Time     Final   Value: 09/14/2014 00:16     Performed at [place]     Final   Value: NO GROWTH     Performed at [facility]   Culture     Final   Value: NO GROWTH     Performed at [facility]    Report Status 09/15/2014 FINAL   Final  MRSA PCR SCREENING     Status: None   Collection Time    09/13/14  9:55 PM      Result Value Ref Range Status   MRSA by PCR NEGATIVE  NEGATIVE Final   Comment:            The GeneXpert MRSA Assay (FDA     approved for NASAL specimens     only), is one component of a     comprehensive MRSA colonization     surveillance program. It is not     intended to diagnose MRSA     infection nor to guide or     monitor treatment for     MRSA infections.    Radiology Reports Dg Chest Port 1 View  09/14/2014   CLINICAL DATA:  Respiratory failure.  Subsequent encounter.  EXAM: PORTABLE CHEST - 1 VIEW  COMPARISON:  09/13/2014; 01/26/2011; 01/24/2011  FINDINGS: Examination is degraded due to portable technique and patient body habitus.  Grossly unchanged enlarged cardiac silhouette and mediastinal contours. Pulmonary vasculature appears less distinct with cephalization flow. Slight worsening of bibasilar heterogeneous opacities, right greater than left. No definite pleural effusion or pneumothorax. Unchanged bones.  IMPRESSION: Slight worsening of pulmonary edema and bibasilar opacities, right greater than left, atelectasis versus infiltrate. Further evaluation with a PA and lateral  chest radiograph may be obtained as clinically indicated.   Electronically Signed   By: Sandi Mariscal M.D.   On: 09/14/2014 07:46   Dg Chest Port 1 View  09/13/2014   CLINICAL DATA:  [place] today; SOB today; pt states that she has been sick for about couple weeks, and today she fell onto her left ankle while she was trying to move; pain in left ankle, pt states that she had previous injury to the left ankle; no N/V, just felt sick; hx HTN; diabetic  EXAM: PORTABLE CHEST - 1 VIEW  COMPARISON:  01/26/2011  FINDINGS: Mild bilateral interstitial thickening. No pleural effusion or pneumothorax. Stable cardiomegaly. Unremarkable osseous structures.  IMPRESSION: Findings concerning for mild CHF.    Electronically Signed   By: Kathreen Devoid   On: 09/13/2014 17:24   Dg Ankle Left Port  09/13/2014   CLINICAL DATA:  63 year old female status post fall at home while trying to transfer into a chair. Initial encounter.  EXAM: PORTABLE LEFT ANKLE - 2 VIEW  COMPARISON:  None.  FINDINGS: Large body habitus and osteopenia. Calcaneus intact. Mortise joint alignment within normal limits and talar dome intact.  There is a corticated chronic appearing osseous fragment at the medial malleolus. However, there is a superimposed lucent acute appearing fracture through the more proximal right tibia meta diaphysis (arrow on image 1). Minimal displacement. The posterior malleolus appears intact. There appears to be a chronic deformity of the lateral malleolus, no other acute fracture identified.  IMPRESSION: 1. Acute on chronic right tibia medial malleolus fracture, minimally displaced. 2. No other acute fracture or dislocation identified about the left ankle.   Electronically Signed   By: Lars Pinks M.D.   On: 09/13/2014 17:26    CBC  Recent Labs Lab 09/13/14 1545  09/15/14 0515 09/16/14 0357 09/17/14 0304 09/18/14 0432 09/19/14 0819  WBC 7.6  < > 9.0 7.7 7.9 9.9 9.9  HGB 8.7*  < > 7.6* 7.3* 7.2* 7.8* 8.3*  HCT 26.6*  < > 21.7* 21.5* 21.8* 24.5* 25.7*  PLT 222  < > 220 256 266 289 275  MCV 89.0  < > 85.1 86.3 89.0 90.4 92.1  MCH 29.1  < > 29.8 29.3 29.4 28.8 29.7  MCHC 32.7  < > 35.0 34.0 33.0 31.8 32.3  RDW 16.2*  < > 15.7* 15.9* 16.3* 16.4* 16.6*  LYMPHSABS 1.0  --  0.5* 0.6* 0.7 0.8  --   MONOABS 0.9  --  0.8 0.6 0.6 0.7  --   EOSABS 0.2  --  0.0 0.0 0.0 0.0  --   BASOSABS 0.0  --  0.0 0.0 0.0 0.0  --   < > = values in this interval not displayed.  Chemistries   Recent Labs Lab 09/13/14 1545  09/14/14 0350 09/15/14 0515 09/16/14 0357 09/17/14 0304 09/18/14 0432 09/19/14 0500  NA 125*  --  126* 137 138 141 140 141  K 3.9  --  3.8 3.5* 3.3* 3.3* 3.3* 3.8  CL 86*  --  88* 97 99 104 104 103   CO2 23  --  18* 24 25 24 25 26   GLUCOSE 112*  --  174* 171* 169* 172* 187* 177*  BUN 47*  --  45* 42* 35* 30* 24* 19  CREATININE 5.70*  < > 4.44* 2.69* 1.64* 1.29* 1.02 0.96  CALCIUM 8.6  --  8.1* 8.5 8.5 8.4 8.3* 8.4  MG  --   --   --  2.2  2.2 2.1 2.2  --   AST 15  --   --   --   --   --   --   --   ALT 11  --   --   --   --   --   --   --   ALKPHOS 51  --   --   --   --   --   --   --   BILITOT 0.8  --   --   --   --   --   --   --   < > = values in this interval not displayed. ------------------------------------------------------------------------------------------------------------------ estimated creatinine clearance is 112.6 ml/min (by C-G formula based on Cr of 0.96). ------------------------------------------------------------------------------------------------------------------ No results found for this basename: HGBA1C,  in the last 72 hours ------------------------------------------------------------------------------------------------------------------ No results found for this basename: CHOL, HDL, LDLCALC, TRIG, CHOLHDL, LDLDIRECT,  in the last 72 hours ------------------------------------------------------------------------------------------------------------------ No results found for this basename: TSH, T4TOTAL, FREET3, T3FREE, THYROIDAB,  in the last 72 hours ------------------------------------------------------------------------------------------------------------------ No results found for this basename: VITAMINB12, FOLATE, FERRITIN, TIBC, IRON, RETICCTPCT,  in the last 72 hours  Coagulation profile No results found for this basename: INR, PROTIME,  in the last 168 hours  No results found for this basename: DDIMER,  in the last 72 hours  Cardiac Enzymes  Recent Labs Lab 09/15/14 0515 09/17/14 0312 09/17/14 1110  TROPONINI <0.30 <0.30 <0.30    ------------------------------------------------------------------------------------------------------------------ No components found with this basename: POCBNP,     Johne Buckle D.O. on 09/19/2014 at 10:47 AM  Between 7am to 7pm - Pager - [phone]  After 7pm go to [email or web address] - password [email or web address]  And look for the night coverage person covering for me after hours  [facility] Office  [phone]

## 2014-09-20 DIAGNOSIS — I509 Heart failure, unspecified: Secondary | ICD-10-CM

## 2014-09-20 LAB — GLUCOSE, CAPILLARY
GLUCOSE-CAPILLARY: 167 mg/dL — AB (ref 70–99)
Glucose-Capillary: 156 mg/dL — ABNORMAL HIGH (ref 70–99)
Glucose-Capillary: 170 mg/dL — ABNORMAL HIGH (ref 70–99)
Glucose-Capillary: 180 mg/dL — ABNORMAL HIGH (ref 70–99)
Glucose-Capillary: 199 mg/dL — ABNORMAL HIGH (ref 70–99)
Glucose-Capillary: 208 mg/dL — ABNORMAL HIGH (ref 70–99)

## 2014-09-20 LAB — BASIC METABOLIC PANEL
Anion gap: 11 (ref 5–15)
BUN: 18 mg/dL (ref 6–23)
CO2: 27 mEq/L (ref 19–32)
Calcium: 8.5 mg/dL (ref 8.4–10.5)
Chloride: 104 mEq/L (ref 96–112)
Creatinine, Ser: 0.91 mg/dL (ref 0.50–1.10)
GFR, EST AFRICAN AMERICAN: 77 mL/min — AB (ref >90)
GFR, EST NON AFRICAN AMERICAN: 67 mL/min — AB (ref >90)
Glucose, Bld: 178 mg/dL — ABNORMAL HIGH (ref 70–99)
POTASSIUM: 3.8 meq/L (ref 3.7–5.3)
Sodium: 142 mEq/L (ref 137–147)

## 2014-09-20 LAB — CBC
HEMATOCRIT: 27.2 % — AB (ref 36.0–46.0)
Hemoglobin: 8.7 g/dL — ABNORMAL LOW (ref 12.0–15.0)
MCH: 29.2 pg (ref 26.0–34.0)
MCHC: 32 g/dL (ref 30.0–36.0)
MCV: 91.3 fL (ref 78.0–100.0)
PLATELETS: 300 10*3/uL (ref 150–400)
RBC: 2.98 MIL/uL — ABNORMAL LOW (ref 3.87–5.11)
RDW: 16.9 % — AB (ref 11.5–15.5)
WBC: 10.8 10*3/uL — ABNORMAL HIGH (ref 4.0–10.5)

## 2014-09-20 MED ORDER — MAGIC MOUTHWASH
10.0000 mL | Freq: Four times a day (QID) | ORAL | Status: DC | PRN
  Administered 2014-09-20 – 2014-09-21 (×2): 10 mL via ORAL
  Filled 2014-09-20: qty 10

## 2014-09-20 MED ORDER — FUROSEMIDE 10 MG/ML IJ SOLN
40.0000 mg | Freq: Two times a day (BID) | INTRAMUSCULAR | Status: DC
  Administered 2014-09-20 – 2014-09-23 (×6): 40 mg via INTRAVENOUS
  Filled 2014-09-20 (×8): qty 4

## 2014-09-20 NOTE — Progress Notes (Signed)
Patient beginning to be SOB while talking and states beginning to feel SOB similar to yesterday.  O2 @ 2L with sats 98-100%, but patient is unable to tolerated any upright positioning and is currently lying flat stating only position she feels comfortable breathing.  Lungs sounds difficult to auscultate but sound clear in bil upper lobes, no wheezing or crackles noted.  Notified Dr. Ree Kida, orders r'cvd.  Will continue to monitor closely.  Christen Bame RN

## 2014-09-20 NOTE — Consult Note (Signed)
Reason for Consult: LV dysfunction, acute, new onset heart failure  Referring Physician: Dr. [name] is an 63 y.o. female.  HPI: 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 She's been recovering/being treated for her infection but has morbid obesity and obesity and echocardiogram ordered and revealed new diagnosis of LV dysfunction. Elevated BNP also on arrival at 1650. She's recived 40 mg IV diuresis currently scheduled bidaily with 2L negative yesterday and ongoing diuresis today. She tells me she's never had heart problems before and that she has no symptoms of CP or passing out at home. She would like a bariatric wheelchair which she has been trying to get from her insurance company.    Past Medical History  Diagnosis Date  . Hypertension   . Diabetes mellitus without complication   . Obesity   . Hypothyroid   . Edema     Past Surgical History  Procedure Laterality Date  . Pituitary surgery  1984  . Back surgery    . Breast surgery      breast reduction    History reviewed. No pertinent family history.  Social History:  reports that she has never smoked. She has never used smokeless tobacco. She reports that she does not drink alcohol. Her drug history is not on file.  Allergies:  Allergies  Allergen Reactions  . Dye Fdc Red [Red Dye] Nausea And Vomiting  . Statins Other (See Comments)    Makes legs numb     Medications:  I have reviewed the patient's current medications. Prior to Admission:  Prescriptions prior to admission  Medication Sig Dispense Refill  . acetaminophen (TYLENOL) 500 MG tablet Take 1,500 mg by mouth every 6 (six) hours as needed for mild pain or headache.      Marland Kitchen aspirin EC 81 MG tablet Take 81  mg by mouth daily with breakfast.      . atenolol (TENORMIN) 100 MG tablet Take 100 mg by mouth daily with breakfast.      . escitalopram (LEXAPRO) 20 MG tablet Take 20 mg by mouth daily.      Marland Kitchen gabapentin (NEURONTIN) 100 MG capsule Take 100 mg by mouth 2 (two) times daily.       . hydrochlorothiazide (HYDRODIURIL) 50 MG tablet Take 50 mg by mouth daily with breakfast.      . hydrocortisone (CORTEF) 10 MG tablet Take 15 mg by mouth daily with breakfast.       . hydrocortisone (CORTEF) 20 MG tablet Take 20 mg by mouth every evening.       . Influenza Vac Split Quad (FLUZONE) 0.25 ML injection Inject 0.25 mLs into the muscle once.      Marland Kitchen levothyroxine (SYNTHROID, LEVOTHROID) 175 MCG tablet Take 175 mcg by mouth daily before breakfast.      . liver oil-zinc oxide (DESITIN) 40 % ointment Apply 1 application topically as needed for irritation.      . nabumetone (RELAFEN) 500 MG tablet Take 500 mg by mouth every 4 (four) hours as needed for mild pain.      Marland Kitchen Neomycin-Bacitracin-Polymyxin (TRIPLE ANTIBIOTIC) 3.5-3850953700 OINT Apply 1 application topically daily as needed (for wound care).      Marland Kitchen OVER THE COUNTER MEDICATION Apply 1 application  topically daily as needed (for sores). Wound Honey      . oxyCODONE-acetaminophen (PERCOCET/ROXICET) 5-325 MG per tablet Take 1 tablet by mouth every 4 (four) hours as needed for severe pain.      . pioglitazone (ACTOS) 45 MG tablet Take 22.5 mg by mouth at bedtime.      . potassium chloride SA (K-DUR,KLOR-CON) 20 MEQ tablet Take 10-20 mEq by mouth 2 (two) times daily. Takes 55mq in the morning and 123m at night      . quinapril (ACCUPRIL) 40 MG tablet Take 40 mg by mouth daily with breakfast.      . risperiDONE (RISPERDAL) 1 MG tablet Take 1 mg by mouth at bedtime.      . Marland KitchenOPINIRole (REQUIP) 1 MG tablet Take 1 mg by mouth 2 (two) times daily.      . traMADol (ULTRAM) 50 MG tablet Take 50 mg by mouth every 4 (four) hours as needed for severe pain.        Scheduled: . aspirin EC  81 mg Oral Q breakfast  . escitalopram  20 mg Oral Daily  . furosemide  40 mg Intravenous Q12H  . gabapentin  100 mg Oral BID  . heparin  5,000 Units Subcutaneous 3 times per day  . hydrocortisone sod succinate (SOLU-CORTEF) inj  100 mg Intravenous Q8H  . insulin aspart  0-20 Units Subcutaneous 6 times per day  . levothyroxine  175 mcg Oral QAC breakfast  . rOPINIRole  1 mg Oral BID  . senna-docusate  2 tablet Oral BID    Results for orders placed during the hospital encounter of 09/13/14 (from the past 48 hour(s))  GLUCOSE, CAPILLARY     Status: Abnormal   Collection Time    09/18/14  7:52 PM      Result Value Ref Range   Glucose-Capillary 185 (*) 70 - 99 mg/dL   Comment 1 Documented in Chart     Comment 2 Notify RN    GLUCOSE, CAPILLARY     Status: Abnormal   Collection Time    09/19/14 12:09 AM      Result Value Ref Range   Glucose-Capillary 154 (*) 70 - 99 mg/dL   Comment 1 Documented in Chart     Comment 2 Notify RN    GLUCOSE, CAPILLARY     Status: Abnormal   Collection Time    09/19/14  4:23 AM      Result Value Ref Range   Glucose-Capillary 174 (*) 70 - 99 mg/dL   Comment 1 Documented in Chart     Comment 2 Notify RN    BASIC METABOLIC PANEL     Status: Abnormal   Collection Time    09/19/14  5:00 AM      Result Value Ref Range   Sodium 141  137 - 147 mEq/L   Potassium 3.8  3.7 - 5.3 mEq/L   Chloride 103  96 - 112 mEq/L   CO2 26  19 - 32 mEq/L   Glucose, Bld 177 (*) 70 - 99 mg/dL   BUN 19  6 - 23 mg/dL   Creatinine, Ser 0.96  0.50 - 1.10 mg/dL   Calcium 8.4  8.4 - 10.5 mg/dL   GFR calc non Af Amer 63 (*) >90 mL/min   GFR calc Af Amer 72 (*) >90 mL/min   Comment: (NOTE)     The eGFR has been calculated using the CKD EPI equation.     This calculation has not been  validated in all clinical situations.     eGFR's persistently <90 mL/min signify possible Chronic Kidney     Disease.   Anion gap 12  5 - 15  CBC     Status: Abnormal    Collection Time    09/19/14  8:19 AM      Result Value Ref Range   WBC 9.9  4.0 - 10.5 K/uL   RBC 2.79 (*) 3.87 - 5.11 MIL/uL   Hemoglobin 8.3 (*) 12.0 - 15.0 g/dL   HCT 25.7 (*) 36.0 - 46.0 %   MCV 92.1  78.0 - 100.0 fL   MCH 29.7  26.0 - 34.0 pg   MCHC 32.3  30.0 - 36.0 g/dL   RDW 16.6 (*) 11.5 - 15.5 %   Platelets 275  150 - 400 K/uL  GLUCOSE, CAPILLARY     Status: Abnormal   Collection Time    09/19/14  8:22 AM      Result Value Ref Range   Glucose-Capillary 186 (*) 70 - 99 mg/dL  GLUCOSE, CAPILLARY     Status: Abnormal   Collection Time    09/19/14 12:59 PM      Result Value Ref Range   Glucose-Capillary 175 (*) 70 - 99 mg/dL  GLUCOSE, CAPILLARY     Status: Abnormal   Collection Time    09/19/14  5:24 PM      Result Value Ref Range   Glucose-Capillary 195 (*) 70 - 99 mg/dL  GLUCOSE, CAPILLARY     Status: Abnormal   Collection Time    09/19/14  8:05 PM      Result Value Ref Range   Glucose-Capillary 191 (*) 70 - 99 mg/dL   Comment 1 Documented in Chart     Comment 2 Notify RN    GLUCOSE, CAPILLARY     Status: Abnormal   Collection Time    09/20/14 12:23 AM      Result Value Ref Range   Glucose-Capillary 199 (*) 70 - 99 mg/dL   Comment 1 Documented in Chart     Comment 2 Notify RN    CBC     Status: Abnormal   Collection Time    09/20/14  5:10 AM      Result Value Ref Range   WBC 10.8 (*) 4.0 - 10.5 K/uL   RBC 2.98 (*) 3.87 - 5.11 MIL/uL   Hemoglobin 8.7 (*) 12.0 - 15.0 g/dL   HCT 27.2 (*) 36.0 - 46.0 %   MCV 91.3  78.0 - 100.0 fL   MCH 29.2  26.0 - 34.0 pg   MCHC 32.0  30.0 - 36.0 g/dL   RDW 16.9 (*) 11.5 - 15.5 %   Platelets 300  150 - 400 K/uL  BASIC METABOLIC PANEL     Status: Abnormal   Collection Time    09/20/14  5:10 AM      Result Value Ref Range   Sodium 142  137 - 147 mEq/L   Potassium 3.8  3.7 - 5.3 mEq/L   Chloride 104  96 - 112 mEq/L   CO2 27  19 - 32 mEq/L   Glucose, Bld 178 (*) 70 - 99 mg/dL   BUN 18  6 - 23 mg/dL   Creatinine, Ser  0.91  0.50 - 1.10 mg/dL   Calcium 8.5  8.4 - 10.5 mg/dL   GFR calc non Af Amer 67 (*) >90 mL/min   GFR calc Af Amer 77 (*) >90 mL/min  Comment: (NOTE)     The eGFR has been calculated using the CKD EPI equation.     This calculation has not been validated in all clinical situations.     eGFR's persistently <90 mL/min signify possible Chronic Kidney     Disease.   Anion gap 11  5 - 15  GLUCOSE, CAPILLARY     Status: Abnormal   Collection Time    09/20/14  5:49 AM      Result Value Ref Range   Glucose-Capillary 180 (*) 70 - 99 mg/dL  GLUCOSE, CAPILLARY     Status: Abnormal   Collection Time    09/20/14  8:16 AM      Result Value Ref Range   Glucose-Capillary 170 (*) 70 - 99 mg/dL  GLUCOSE, CAPILLARY     Status: Abnormal   Collection Time    09/20/14  1:08 PM      Result Value Ref Range   Glucose-Capillary 167 (*) 70 - 99 mg/dL  GLUCOSE, CAPILLARY     Status: Abnormal   Collection Time    09/20/14  3:58 PM      Result Value Ref Range   Glucose-Capillary 156 (*) 70 - 99 mg/dL   Comment 1 Notify RN     Comment 2 Documented in Chart      No results found.  Review of Systems  Constitutional: Positive for malaise/fatigue. Negative for fever and chills.  Eyes: Negative for blurred vision and pain.  Respiratory: Positive for shortness of breath. Negative for hemoptysis.   Cardiovascular: Positive for leg swelling. Negative for chest pain and orthopnea.  Gastrointestinal: Negative for nausea and vomiting.  Genitourinary: Negative for dysuria and hematuria.  Musculoskeletal: Positive for falls and joint pain.  Neurological: Negative for dizziness, tingling, tremors and headaches.  Endo/Heme/Allergies: Negative for polydipsia.  Psychiatric/Behavioral: Negative for depression and suicidal ideas.   Blood pressure 156/85, pulse 91, temperature 98.4 F (36.9 C), temperature source Axillary, resp. rate 20, height 5' 2" (1.575 m), weight 214.551 kg (473 lb), SpO2 97.00%. Physical Exam   Nursing note and vitals reviewed. Constitutional: She is oriented to person, place, and time. She appears well-developed and well-nourished. No distress.  HENT:  Head: Normocephalic and atraumatic.  Nose: Nose normal.  Mouth/Throat: Oropharynx is clear and moist. No oropharyngeal exudate.  Eyes: Conjunctivae and EOM are normal. Pupils are equal, round, and reactive to light. No scleral icterus.  Neck: Normal range of motion. Neck supple. JVD present. No tracheal deviation present.  Cardiovascular: Normal rate, regular rhythm, normal heart sounds and intact distal pulses.   No murmur heard. Respiratory: Effort normal. She has no wheezes. She has rales.  GI: Soft. Bowel sounds are normal. She exhibits no distension. There is no tenderness. There is no rebound.  Musculoskeletal: Normal range of motion. She exhibits edema. She exhibits no tenderness.  Neurological: She is alert and oriented to person, place, and time. No cranial nerve deficit. Coordination normal.  Skin: Skin is warm and dry. No rash noted. She is not diaphoretic. No erythema.  Psychiatric: She has a normal mood and affect. Her behavior is normal. Judgment and thought content normal.   Labs reviewed above; h/h 8.7/27, K 3.8, na 142, bun/cr 18/0.9, BNP on admission 1650 ECG: SR 72 with nonspecific/LBBB-ish  Echo EF 30-35%, dLA  Assessment/Plan: Ms. Konni Kesinger is a 63 yo woman with PMH of hypertension, T2DM, panhypopituitarism, morbid obesity admitted with urosepsis after a fall with left ankle fracture with cardiology consultation for LV  dysfunction. 1. LV dysfunction/acute heart failure, new diagnosis: She has LV dysfunction with volume overload. Differential diagnosis is multifactorial, ischemic, obesity, OSA, component of diastolic HF, idiopathic/nonischemic among other etiologies.  - update TSH, echocardiogram reviewed - agree with continued diuresis - will need to gradually start low dose coreg first, then ace- vs.  entresto once more euvolemic - once closer to euvolemia consider ischemic evaluation 2. Hypertension: focus on HF medications as above  3. T2DM: treat comorbidities of hypertension, dyslipidemia  - update lipid profile and perform risk to determine intermediate vs. High dose statin therapy  4. Obstructive sleep apnea: encourage use of CPAP and confirm this is primarily OSA and not central sleep apnea   ,  09/20/2014, 5:48 PM

## 2014-09-20 NOTE — Progress Notes (Signed)
Pt placed on home CPAP via Nasal pillows with 2 LPM O2 bleed in.  Pt tolerating well at this time, RT to monitor and assess as needed

## 2014-09-20 NOTE — Progress Notes (Signed)
[facility]                                                                              Patient Demographics  Kathy Simpson, is a 63 y.o. female, DOB - 27-Sep-1951, PZW:[id number]  Admit date - 09/13/2014   Admitting Physician Kathy Mcardle, MD  Outpatient Primary MD for the patient is Kathy Shelling, MD  LOS - 7   Chief Complaint  Patient presents with  . 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 and morbid obesity brought to ED by EMS with one week hx of malaise, anorexia, poor PO intake and suffered a fall with c/o L ankle pain. Was hypotensive in ED and received several liters NS with persistent low BP. UA revealed 11-20 WBC/HPF raising concern for UTI. PCCM asked to admit for sepsis.   Assessment & Plan   Septic shock -Resolved -Thought to be secondary to urinary tract infection -There is a question of whether blood pressure readings are accurate, patient did have A-line inserted, however that was removed this morning.   -Patient completed 7 days of vancomycin and Zosyn, will discontinue antibiotics today  Urinary tract infection -Urine culture shows no growth -Patient has no leukocytosis and is currently afebrile -Completed antibiotic course  Acute renal failure -Appears to have resolved with IV fluid hydration -Likely secondary to sepsis including hypotension versus prerenal causes -Creatinine was 5.7 at time of admission, currently 0.91  Obstructive sleep apnea -Continue CPAP each bedtime  Dyspnea -Likely multifactorial including body habitus versus body positioning versus possible volume overload -Improved with sitting up -Patient did have an elevated BNP at admission, 1649 -Echocardiogram pending -Will continue to monitor intake and output as well as daily weights -Will continue IV  fluids and give patient one dose of Lasix -Patient continues to need supplemental oxygen -Spoke with nursing regarding positioning patient.  Left ankle fracture -Orthopedic surgery consulted and appreciated and did not recommend surgery at this time -cast in place -Patient is to followup within one to 2 weeks -No weightbearing on the lower extremity  Physical deconditioning with frequent falls -PT and OT consulted and recommended SNF -SW consulted for placement  Morbid obesity -This should be addressed by her primary care physician  Chronic anemia -Hemoglobin appears to be at baseline -will continue to monitor CBC -Patient currently hemodynamically stable  Sacral decub, stage II/ hip wound -Wound care consulted  Code Status: Full  Family Communication: None at bedside  Disposition Plan: Admitted, Pending SNF placement  Time Spent in minutes   30 minutes  Procedures  None  Consults   PCCM Orthopedic surgery  DVT Prophylaxis  Heparin/SCDs  Lab Results  Component Value Date   PLT 300 09/20/2014    Medications  Scheduled Meds: . aspirin EC  81 mg Oral Q breakfast  . escitalopram  20 mg Oral Daily  . gabapentin  100 mg Oral BID  . heparin  5,000 Units Subcutaneous 3 times per day  . hydrocortisone sod succinate (SOLU-CORTEF)  inj  100 mg Intravenous Q8H  . insulin aspart  0-20 Units Subcutaneous 6 times per day  . levothyroxine  175 mcg Oral QAC breakfast  . rOPINIRole  1 mg Oral BID  . senna-docusate  2 tablet Oral BID   Continuous Infusions:   PRN Meds:.oxyCODONE-acetaminophen, traMADol, URELLE  Antibiotics    Anti-infectives   Start     Dose/Rate Route Frequency Ordered Stop   09/16/14 1800  vancomycin (VANCOCIN) 2,000 mg in sodium chloride 0.9 % 500 mL IVPB  Status:  Discontinued     2,000 mg 250 mL/hr over 120 Minutes Intravenous Every 24 hours 09/16/14 1252 09/20/14 0856   09/15/14 1800  vancomycin (VANCOCIN) 2,000 mg in sodium chloride 0.9 % 500  mL IVPB  Status:  Discontinued     2,000 mg 250 mL/hr over 120 Minutes Intravenous Every 48 hours 09/13/14 2141 09/16/14 1252   09/14/14 1200  piperacillin-tazobactam (ZOSYN) IVPB 3.375 g  Status:  Discontinued     3.375 g 12.5 mL/hr over 240 Minutes Intravenous Every 8 hours 09/14/14 1028 09/20/14 0856   09/13/14 2359  piperacillin-tazobactam (ZOSYN) IVPB 2.25 g  Status:  Discontinued     2.25 g 100 mL/hr over 30 Minutes Intravenous 3 times per day 09/13/14 1702 09/14/14 1028   09/13/14 1800  vancomycin (VANCOCIN) IVPB 1000 mg/200 mL premix     1,000 mg 200 mL/hr over 60 Minutes Intravenous  Once 09/13/14 1701 09/13/14 1920   09/13/14 1600  piperacillin-tazobactam (ZOSYN) IVPB 3.375 g     3.375 g 100 mL/hr over 30 Minutes Intravenous  Once 09/13/14 1559 09/13/14 1645   09/13/14 1600  vancomycin (VANCOCIN) IVPB 1000 mg/200 mL premix     1,000 mg 200 mL/hr over 60 Minutes Intravenous  Once 09/13/14 1559 09/13/14 1745        Subjective:   Kathy Simpson seen and examined today.  Patient states she continues to have shortness of breath, which is worse with lying down, improves with sitting up. She states she is overall feeling better.  Patient denies any abdominal pain, she chest pain, dizziness, headache, nausea or vomiting.   Objective:   Filed Vitals:   09/19/14 0220 09/19/14 0515 09/19/14 1822 09/20/14 0015  BP: 143/49 138/63 145/72 154/59  Pulse: 84 79 87 79  Temp: 98.5 F (36.9 C) 99.3 F (37.4 C) 98.7 F (37.1 C) 98.3 F (36.8 C)  TempSrc: Oral Oral Oral Oral  Resp: 20 20 20 16   Height:      Weight:      SpO2: 100% 94% 94% 97%    Wt Readings from Last 3 Encounters:  No data found for Wt     Intake/Output Summary (Last 24 hours) at 09/20/14 1010 Last data filed at 09/20/14 0600  Gross per 24 hour  Intake    960 ml  Output   2450 ml  Net  -1490 ml    Exam  General: Well developed, morbidly obese, NAD, appears stated age  74: NCAT, mucous membranes  moist.   Cardiovascular: Distant heart sounds, normal S1-S2   Respiratory: Difficult to auscultate due to body habitus, however clear, unable to appreciate crackles, no wheezing   Abdomen: Soft, obese, nontender, nondistended, + bowel sounds  Extremities: warm dry without cyanosis clubbing. +edema in ext. Cast in place LLE  Neuro: AAOx3, cranial nerves grossly intact.   Psych: Appropriate mood and affect  Data Review   Micro Results Recent Results (from the past 240 hour(s))  CULTURE, BLOOD (ROUTINE  X 2)     Status: None   Collection Time    09/13/14  3:43 PM      Result Value Ref Range Status   Specimen Description BLOOD RIGHT ANTECUBITAL   Final   Special Requests BOTTLES DRAWN AEROBIC AND ANAEROBIC 4CC   Final   Culture  Setup Time     Final   Value: 09/13/2014 18:50     Performed at [facility]   Culture     Final   Value: NO GROWTH 5 DAYS     Performed at [facility]   Report Status 09/19/2014 FINAL   Final  CULTURE, BLOOD (ROUTINE X 2)     Status: None   Collection Time    09/13/14  3:52 PM      Result Value Ref Range Status   Specimen Description BLOOD RIGHT FOREARM   Final   Special Requests BOTTLES DRAWN AEROBIC ONLY 3CC   Final   Culture  Setup Time     Final   Value: 09/13/2014 18:50     Performed at [facility]   Culture     Final   Value: NO GROWTH 5 DAYS     Performed at [facility]   Report Status 09/19/2014 FINAL   Final  URINE CULTURE     Status: None   Collection Time    09/13/14  4:49 PM      Result Value Ref Range Status   Specimen Description URINE, CATHETERIZED   Final   Special Requests NONE   Final   Culture  Setup Time     Final   Value: 09/14/2014 00:16     Performed at [place]     Final   Value: NO GROWTH     Performed at [facility]   Culture     Final   Value: NO GROWTH     Performed at [facility]   Report Status 09/15/2014 FINAL   Final  MRSA PCR  SCREENING     Status: None   Collection Time    09/13/14  9:55 PM      Result Value Ref Range Status   MRSA by PCR NEGATIVE  NEGATIVE Final   Comment:            The GeneXpert MRSA Assay (FDA     approved for NASAL specimens     only), is one component of a     comprehensive MRSA colonization     surveillance program. It is not     intended to diagnose MRSA     infection nor to guide or     monitor treatment for     MRSA infections.    Radiology Reports Dg Chest Port 1 View  09/14/2014   CLINICAL DATA:  Respiratory failure.  Subsequent encounter.  EXAM: PORTABLE CHEST - 1 VIEW  COMPARISON:  09/13/2014; 01/26/2011; 01/24/2011  FINDINGS: Examination is degraded due to portable technique and patient body habitus.  Grossly unchanged enlarged cardiac silhouette and mediastinal contours. Pulmonary vasculature appears less distinct with cephalization flow. Slight worsening of bibasilar heterogeneous opacities, right greater than left. No definite pleural effusion or pneumothorax. Unchanged bones.  IMPRESSION: Slight worsening of pulmonary edema and bibasilar opacities, right greater than left, atelectasis versus infiltrate. Further evaluation with a PA and lateral chest radiograph may be obtained as clinically indicated.   Electronically Signed   By: Eldridge Abrahams.D.  On: 09/14/2014 07:46   Dg Chest Port 1 View  09/13/2014   CLINICAL DATA:  [place] today; SOB today; pt states that she has been sick for about couple weeks, and today she fell onto her left ankle while she was trying to move; pain in left ankle, pt states that she had previous injury to the left ankle; no N/V, just felt sick; hx HTN; diabetic  EXAM: PORTABLE CHEST - 1 VIEW  COMPARISON:  01/26/2011  FINDINGS: Mild bilateral interstitial thickening. No pleural effusion or pneumothorax. Stable cardiomegaly. Unremarkable osseous structures.  IMPRESSION: Findings concerning for mild CHF.   Electronically Signed   By: Kathreen Devoid   On:  09/13/2014 17:24   Dg Ankle Left Port  09/13/2014   CLINICAL DATA:  63 year old female status post fall at home while trying to transfer into a chair. Initial encounter.  EXAM: PORTABLE LEFT ANKLE - 2 VIEW  COMPARISON:  None.  FINDINGS: Large body habitus and osteopenia. Calcaneus intact. Mortise joint alignment within normal limits and talar dome intact.  There is a corticated chronic appearing osseous fragment at the medial malleolus. However, there is a superimposed lucent acute appearing fracture through the more proximal right tibia meta diaphysis (arrow on image 1). Minimal displacement. The posterior malleolus appears intact. There appears to be a chronic deformity of the lateral malleolus, no other acute fracture identified.  IMPRESSION: 1. Acute on chronic right tibia medial malleolus fracture, minimally displaced. 2. No other acute fracture or dislocation identified about the left ankle.   Electronically Signed   By: Lars Pinks M.D.   On: 09/13/2014 17:26    CBC  Recent Labs Lab 09/13/14 1545  09/15/14 0515 09/16/14 0357 09/17/14 0304 09/18/14 0432 09/19/14 0819 09/20/14 0510  WBC 7.6  < > 9.0 7.7 7.9 9.9 9.9 10.8*  HGB 8.7*  < > 7.6* 7.3* 7.2* 7.8* 8.3* 8.7*  HCT 26.6*  < > 21.7* 21.5* 21.8* 24.5* 25.7* 27.2*  PLT 222  < > 220 256 266 289 275 300  MCV 89.0  < > 85.1 86.3 89.0 90.4 92.1 91.3  MCH 29.1  < > 29.8 29.3 29.4 28.8 29.7 29.2  MCHC 32.7  < > 35.0 34.0 33.0 31.8 32.3 32.0  RDW 16.2*  < > 15.7* 15.9* 16.3* 16.4* 16.6* 16.9*  LYMPHSABS 1.0  --  0.5* 0.6* 0.7 0.8  --   --   MONOABS 0.9  --  0.8 0.6 0.6 0.7  --   --   EOSABS 0.2  --  0.0 0.0 0.0 0.0  --   --   BASOSABS 0.0  --  0.0 0.0 0.0 0.0  --   --   < > = values in this interval not displayed.  Chemistries   Recent Labs Lab 09/13/14 1545  09/14/14 0350 09/15/14 0515 09/16/14 0357 09/17/14 0304 09/18/14 0432 09/19/14 0500 09/20/14 0510  NA 125*  --  126* 137 138 141 140 141 142  K 3.9  --  3.8 3.5* 3.3*  3.3* 3.3* 3.8 3.8  CL 86*  --  88* 97 99 104 104 103 104  CO2 23  --  18* 24 25 24 25 26 27   GLUCOSE 112*  --  174* 171* 169* 172* 187* 177* 178*  BUN 47*  --  45* 42* 35* 30* 24* 19 18  CREATININE 5.70*  < > 4.44* 2.69* 1.64* 1.29* 1.02 0.96 0.91  CALCIUM 8.6  --  8.1* 8.5 8.5 8.4 8.3* 8.4 8.5  MG  --   --   --  2.2 2.2 2.1 2.2  --   --   AST 15  --   --   --   --   --   --   --   --   ALT 11  --   --   --   --   --   --   --   --   ALKPHOS 51  --   --   --   --   --   --   --   --   BILITOT 0.8  --   --   --   --   --   --   --   --   < > = values in this interval not displayed. ------------------------------------------------------------------------------------------------------------------ estimated creatinine clearance is 118.8 ml/min (by C-G formula based on Cr of 0.91). ------------------------------------------------------------------------------------------------------------------ No results found for this basename: HGBA1C,  in the last 72 hours ------------------------------------------------------------------------------------------------------------------ No results found for this basename: CHOL, HDL, LDLCALC, TRIG, CHOLHDL, LDLDIRECT,  in the last 72 hours ------------------------------------------------------------------------------------------------------------------ No results found for this basename: TSH, T4TOTAL, FREET3, T3FREE, THYROIDAB,  in the last 72 hours ------------------------------------------------------------------------------------------------------------------ No results found for this basename: VITAMINB12, FOLATE, FERRITIN, TIBC, IRON, RETICCTPCT,  in the last 72 hours  Coagulation profile No results found for this basename: INR, PROTIME,  in the last 168 hours  No results found for this basename: DDIMER,  in the last 72 hours  Cardiac Enzymes  Recent Labs Lab 09/15/14 0515 09/17/14 0312 09/17/14 1110  TROPONINI <0.30 <0.30 <0.30    ------------------------------------------------------------------------------------------------------------------ No components found with this basename: POCBNP,     Artrell Lawless D.O. on 09/20/2014 at 10:10 AM  Between 7am to 7pm - Pager - [phone]  After 7pm go to [email or web address] - password [email or web address]  And look for the night coverage person covering for me after hours  [facility] Office  [phone]

## 2014-09-20 NOTE — Clinical Social Work Psychosocial (Signed)
Clinical Social Work Department BRIEF PSYCHOSOCIAL ASSESSMENT 09/20/2014  Patient:  Kathy Simpson, Kathy Simpson     Account Number:  [id number]     Admit date:  09/13/2014  Clinical Social Worker:  Daiva Huge  Date/Time:  09/20/2014 11:20 AM  Referred by:  CSW  Date Referred:  09/18/2014 Referred for  SNF Placement   Other Referral:   Interview type:  Patient Other interview type:    PSYCHOSOCIAL DATA Living Status:  HUSBAND Admitted from facility:   Level of care:   Primary support name:  husband, sister extended family Primary support relationship to patient:  FAMILY Degree of support available:   good    CURRENT CONCERNS Current Concerns  Post-Acute Placement   Other Concerns:    SOCIAL WORK ASSESSMENT / PLAN CSW met with patient at bedside to discuss placement options.  Patient admitted to being non compliant at home with getting up out of the bed. Patient discussed not going to the bathroom for some time and then falling when she did get up and broke her left foot.  Patient discussed having support at home, lives with her husband, has 3 children who are supportive and she is hoping for short rehab.  CSW provided patient with a list of facilities and she is going to have her family research them. 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   Assessment/plan status:  Other - See comment Other assessment/ plan:   Complete FL2 and PASARR for snf search   Information/referral to community resources:   snf list    PATIENT'S/FAMILY'S RESPONSE TO PLAN OF CARE: Patent was very pleasant and agreeable to snf and was hoping she would be "climbing up the ladder soon". CSW offered encouragement and support and will proceed with snf search       Eduard Clos, MSW, [place]

## 2014-09-20 NOTE — Progress Notes (Signed)
Pt wants RT to come back around midnight to place CPAP on.  RT to monitor and assess as needed.

## 2014-09-21 DIAGNOSIS — I5021 Acute systolic (congestive) heart failure: Secondary | ICD-10-CM

## 2014-09-21 DIAGNOSIS — I5022 Chronic systolic (congestive) heart failure: Secondary | ICD-10-CM

## 2014-09-21 LAB — LIPID PANEL
Cholesterol: 131 mg/dL (ref 0–200)
HDL: 25 mg/dL — ABNORMAL LOW (ref >39)
LDL CALC: 77 mg/dL (ref 0–99)
Total CHOL/HDL Ratio: 5.2 RATIO
Triglycerides: 144 mg/dL (ref <150)
VLDL: 29 mg/dL (ref 0–40)

## 2014-09-21 LAB — TSH: TSH: 0.007 u[IU]/mL — AB (ref 0.350–4.500)

## 2014-09-21 LAB — GLUCOSE, CAPILLARY
Glucose-Capillary: 184 mg/dL — ABNORMAL HIGH (ref 70–99)
Glucose-Capillary: 189 mg/dL — ABNORMAL HIGH (ref 70–99)
Glucose-Capillary: 205 mg/dL — ABNORMAL HIGH (ref 70–99)
Glucose-Capillary: 208 mg/dL — ABNORMAL HIGH (ref 70–99)
Glucose-Capillary: 212 mg/dL — ABNORMAL HIGH (ref 70–99)
Glucose-Capillary: 218 mg/dL — ABNORMAL HIGH (ref 70–99)
Glucose-Capillary: 236 mg/dL — ABNORMAL HIGH (ref 70–99)

## 2014-09-21 LAB — CLOSTRIDIUM DIFFICILE BY PCR: Toxigenic C. Difficile by PCR: NEGATIVE

## 2014-09-21 MED ORDER — LISINOPRIL 2.5 MG PO TABS
2.5000 mg | ORAL_TABLET | Freq: Every day | ORAL | Status: DC
  Administered 2014-09-21: 2.5 mg via ORAL
  Filled 2014-09-21 (×2): qty 1

## 2014-09-21 MED ORDER — PRO-STAT SUGAR FREE PO LIQD
30.0000 mL | Freq: Two times a day (BID) | ORAL | Status: DC
  Administered 2014-09-21 – 2014-09-24 (×6): 30 mL via ORAL
  Filled 2014-09-21 (×8): qty 30

## 2014-09-21 MED ORDER — CARVEDILOL 6.25 MG PO TABS
6.2500 mg | ORAL_TABLET | Freq: Two times a day (BID) | ORAL | Status: DC
  Administered 2014-09-21 – 2014-09-24 (×8): 6.25 mg via ORAL
  Filled 2014-09-21 (×9): qty 1

## 2014-09-21 MED ORDER — CARVEDILOL 3.125 MG PO TABS
3.1250 mg | ORAL_TABLET | Freq: Two times a day (BID) | ORAL | Status: DC
  Filled 2014-09-21 (×3): qty 1

## 2014-09-21 NOTE — Progress Notes (Signed)
    Subjective:  Denies CP or dyspnea   Objective:  Filed Vitals:   09/20/14 1425 09/20/14 2054 09/21/14 0000 09/21/14 0547  BP: 156/85 141/58  144/64  Pulse: 91 87  76  Temp: 98.4 F (36.9 C) 98.6 F (37 C)  99.7 F (37.6 C)  TempSrc: Axillary Oral  Oral  Resp: 20 19 18 19   Height:      Weight:    518 lb 1.3 oz (235 kg)  SpO2: 97% 98%  100%    Intake/Output from previous day:  Intake/Output Summary (Last 24 hours) at 09/21/14 0816 Last data filed at 09/21/14 0700  Gross per 24 hour  Intake   1540 ml  Output   3600 ml  Net  -2060 ml    Physical Exam: Physical exam: Well-developed morbidly obese in no acute distress.  Skin is warm and dry.  HEENT is normal.  Neck is supple.  Chest is clear to auscultation with normal expansion.  Cardiovascular exam is regular rate and rhythm.  Abdominal exam nontender or distended. No masses palpated. Extremities show 1+ edema. neuro grossly intact    Lab Results: Basic Metabolic Panel:  Recent Labs  09/19/14 0500 09/20/14 0510  NA 141 142  K 3.8 3.8  CL 103 104  CO2 26 27  GLUCOSE 177* 178*  BUN 19 18  CREATININE 0.96 0.91  CALCIUM 8.4 8.5   CBC:  Recent Labs  09/19/14 0819 09/20/14 0510  WBC 9.9 10.8*  HGB 8.3* 8.7*  HCT 25.7* 27.2*  MCV 92.1 91.3  PLT 275 300    Assessment/Plan:  1 acute systolic congestive heart failure-Patient is volume overloaded on examination. However the exam is morbidly obese and total volume status difficult to assess. Continue Lasix 40 mg IV twice a day and follow renal function. Increase carvedilol to 6.25 mg by mouth twice a day. Add lisinopril 2.5 mg daily. Titrate medications as tolerated by pulse and blood pressure. Etiology of cardiomyopathy is unclear. She would certainly be at risk for coronary disease. However given her morbid obesity and limited mobility ischemia evaluation would be difficult. We will consider this moving forward. Her TSH is markedly decreased but in the  setting of panhypopituitarism not helpful. 2 urosepsis-continue antibiotics per primary care. 3 acute renal failure-resolved with hydration and treatment of urinary tract infection. 4 morbid obesity-her obesity is life-threatening. We discussed weight loss. 5 diabetes mellitus-management per primary care. 6 obstructive sleep apnea 7 Panhypopituitarism-continue preadmission meds.  [name] 09/21/2014, 8:16 AM

## 2014-09-21 NOTE — Progress Notes (Signed)
CSW assisting with d/c planning to SNF. SNF search initiated during the weekend. Bed offers are pending.   Werner Lean LCSW [phone]

## 2014-09-21 NOTE — Progress Notes (Addendum)
[facility]                                                                              Patient Demographics  Kathy Simpson, is a 63 y.o. female, DOB - [birth year], WGN:[id number]  Admit date - 09/13/2014   Admitting Physician Wilhelmina Mcardle, MD  Outpatient Primary MD for the patient is [name], MD  LOS - 8   Chief Complaint  Patient presents with  . 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 and morbid obesity brought to ED by EMS with one week hx of malaise, anorexia, poor PO intake and suffered a fall with c/o L ankle pain. Was hypotensive in ED and received several liters NS with persistent low BP. UA revealed 11-20 WBC/HPF raising concern for UTI. PCCM asked to admit for sepsis.  Interim history Patient admitted to ICU/Stepdown for septic shock and AKI/UTI.  Given 7days of broad spectrum antibiotics.  Also had left ankle fracture.  Patient was moved to medical floor.  Became SOB.  Upon admission BNP elevated. Started patient on lasix.  Obtained echo which showed EF 30-35%.  Cardiology consulted. Possible workup for ishcemia?  Patient will need to be discharged to SNF, however, pending further recommendations from cardio.    Assessment & Plan   Acute systolic congestive heart failure -Upon admission patient was noted to be in septic shock with AKI, and was given several liters of fluid -Patient had no history of CHF -Patient began to experience dyspnea -Upon admission BNP was elevated, 1649 -Echocardiogram obtained:  EF 30-35% -Continue lasix 40mg  IV BID (monitor Cr closely) -Cardiology consulted and appreciated and agreed with lasix, increased Coreg to 6.25mg  BID, and added lisinopirl -Cardiology may consider evaluation for ishcemia  -Monitor daily weights, intake/output, UO in the past 24hr  >3L  Dyspnea secondary to above -May also be complicated by body habitus -Encourage incentive spirometry and sitting up as much as possible   Septic shock -Resolved -Thought to be secondary to urinary tract infection -There is a question of whether blood pressure readings are accurate, patient did have A-line inserted, however removed on 10/23 -Patient completed 7 days of vancomycin and Zosyn, antibiotics discontinued on 10/25  Urinary tract infection -Urine culture shows no growth -Patient has no leukocytosis and is currently afebrile -Completed antibiotic course  Acute renal failure -Appears to have resolved with IV fluid hydration -Likely secondary to sepsis including hypotension versus prerenal causes -Creatinine was 5.7 at time of admission, currently 0.91 -IV fluids discontinued 09/19/2014  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, 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  Obstructive sleep apnea -Continue CPAP each bedtime  Left ankle fracture -Orthopedic surgery consulted and appreciated and did not recommend surgery at this time -cast in place -Patient is to followup within one to 2 weeks -No weightbearing on the lower extremity  Physical deconditioning with frequent falls -PT and OT consulted and recommended SNF -SW consulted for placement  Morbid obesity -This should be addressed by her primary care physician  Chronic  anemia -Hemoglobin appears to be at baseline -will continue to monitor CBC -Patient currently hemodynamically stable  Sacral decub, stage II/ hip wound -Wound care consulted  Code Status: Full  Family Communication: None at bedside  Disposition Plan: Admitted, Pending SNF placement  Time Spent in minutes   30 minutes  Procedures  Echocardiogram Study Conclusions - Left ventricle: The cavity size was moderately dilated. Systolic function was moderately to severely reduced. The estimated ejection fraction was in the range of 30% to 35%.  - Left atrium:  The atrium was moderately dilated.  Consults   PCCM Orthopedic surgery Cardiology  DVT Prophylaxis  Heparin/SCDs  Lab Results  Component Value Date   PLT 300 09/20/2014    Medications  Scheduled Meds: . aspirin EC  81 mg Oral Q breakfast  . carvedilol  6.25 mg Oral BID WC  . escitalopram  20 mg Oral Daily  . furosemide  40 mg Intravenous Q12H  . gabapentin  100 mg Oral BID  . heparin  5,000 Units Subcutaneous 3 times per day  . hydrocortisone sod succinate (SOLU-CORTEF) inj  100 mg Intravenous Q8H  . insulin aspart  0-20 Units Subcutaneous 6 times per day  . levothyroxine  175 mcg Oral QAC breakfast  . lisinopril  2.5 mg Oral Daily  . rOPINIRole  1 mg Oral BID  . senna-docusate  2 tablet Oral BID   Continuous Infusions:   PRN Meds:.magic mouthwash, oxyCODONE-acetaminophen, traMADol, URELLE  Antibiotics    Anti-infectives   Start     Dose/Rate Route Frequency Ordered Stop   09/16/14 1800  vancomycin (VANCOCIN) 2,000 mg in sodium chloride 0.9 % 500 mL IVPB  Status:  Discontinued     2,000 mg 250 mL/hr over 120 Minutes Intravenous Every 24 hours 09/16/14 1252 09/20/14 0856   09/15/14 1800  vancomycin (VANCOCIN) 2,000 mg in sodium chloride 0.9 % 500 mL IVPB  Status:  Discontinued     2,000 mg 250 mL/hr over 120 Minutes Intravenous Every 48 hours 09/13/14 2141 09/16/14 1252   09/14/14 1200  piperacillin-tazobactam (ZOSYN) IVPB 3.375 g  Status:  Discontinued     3.375 g 12.5 mL/hr over 240 Minutes Intravenous Every 8 hours 09/14/14 1028 09/20/14 0856   09/13/14 2359  piperacillin-tazobactam (ZOSYN) IVPB 2.25 g  Status:  Discontinued     2.25 g 100 mL/hr over 30 Minutes Intravenous 3 times per day 09/13/14 1702 09/14/14 1028   09/13/14 1800  vancomycin (VANCOCIN) IVPB 1000 mg/200 mL premix     1,000 mg 200 mL/hr over 60 Minutes Intravenous  Once 09/13/14 1701 09/13/14 1920   09/13/14 1600  piperacillin-tazobactam (ZOSYN) IVPB 3.375 g     3.375 g 100 mL/hr over 30  Minutes Intravenous  Once 09/13/14 1559 09/13/14 1645   09/13/14 1600  vancomycin (VANCOCIN) IVPB 1000 mg/200 mL premix     1,000 mg 200 mL/hr over 60 Minutes Intravenous  Once 09/13/14 1559 09/13/14 1745        Subjective:   Kathy Simpson seen and examined today.  Patient states she continues to have shortness of breath, which is worse with lying down, improves with sitting up. She states she is overall feeling better.  Patient denies any abdominal pain, she chest pain, dizziness, headache, nausea or vomiting.   Objective:   Filed Vitals:   09/20/14 1425 09/20/14 2054 09/21/14 0000 09/21/14 0547  BP: 156/85 141/58  144/64  Pulse: 91 87  76  Temp: 98.4 F (36.9 C) 98.6 F (37 C)  99.7 F (37.6 C)  TempSrc: Axillary Oral  Oral  Resp: 20 19 18 19   Height:      Weight:    235 kg (518 lb 1.3 oz)  SpO2: 97% 98%  100%    Wt Readings from Last 3 Encounters:  09/21/14 235 kg (518 lb 1.3 oz)     Intake/Output Summary (Last 24 hours) at 09/21/14 1105 Last data filed at 09/21/14 1042  Gross per 24 hour  Intake   1780 ml  Output   4600 ml  Net  -2820 ml    Exam  General: Well developed, morbidly obese, NAD, appears stated age  4: NCAT, mucous membranes moist.   Cardiovascular: Distant heart sounds, normal S1-S2   Respiratory: Difficult to auscultate due to body habitus, however clear, unable to appreciate crackles, no wheezing   Abdomen: Soft, obese, nontender, nondistended, + bowel sounds  Extremities: warm dry without cyanosis clubbing. +edema in ext. Cast in place LLE  Neuro: AAOx3, cranial nerves grossly intact.   Psych: Appropriate mood and affect  Data Review   Micro Results Recent Results (from the past 240 hour(s))  CULTURE, BLOOD (ROUTINE X 2)     Status: None   Collection Time    09/13/14  3:43 PM      Result Value Ref Range Status   Specimen Description BLOOD RIGHT ANTECUBITAL   Final   Special Requests BOTTLES DRAWN AEROBIC AND ANAEROBIC 4CC    Final   Culture  Setup Time     Final   Value: 09/13/2014 18:50     Performed at [facility]   Culture     Final   Value: NO GROWTH 5 DAYS     Performed at [facility]   Report Status 09/19/2014 FINAL   Final  CULTURE, BLOOD (ROUTINE X 2)     Status: None   Collection Time    09/13/14  3:52 PM      Result Value Ref Range Status   Specimen Description BLOOD RIGHT FOREARM   Final   Special Requests BOTTLES DRAWN AEROBIC ONLY 3CC   Final   Culture  Setup Time     Final   Value: 09/13/2014 18:50     Performed at [facility]   Culture     Final   Value: NO GROWTH 5 DAYS     Performed at [facility]   Report Status 09/19/2014 FINAL   Final  URINE CULTURE     Status: None   Collection Time    09/13/14  4:49 PM      Result Value Ref Range Status   Specimen Description URINE, CATHETERIZED   Final   Special Requests NONE   Final   Culture  Setup Time     Final   Value: 09/14/2014 00:16     Performed at [facility]     Final   Value: NO GROWTH     Performed at [facility]   Culture     Final   Value: NO GROWTH     Performed at [facility]   Report Status 09/15/2014 FINAL   Final  MRSA PCR SCREENING     Status: None   Collection Time    09/13/14  9:55 PM      Result Value Ref Range Status   MRSA by PCR NEGATIVE  NEGATIVE Final   Comment:  The GeneXpert MRSA Assay (FDA     approved for NASAL specimens     only), is one component of a     comprehensive MRSA colonization     surveillance program. It is not     intended to diagnose MRSA     infection nor to guide or     monitor treatment for     MRSA infections.  CLOSTRIDIUM DIFFICILE BY PCR     Status: None   Collection Time    09/20/14  7:59 PM      Result Value Ref Range Status   C difficile by pcr NEGATIVE  NEGATIVE Final   Comment: Performed at [facility]    Radiology Reports Dg Chest Port 1 View  09/14/2014   CLINICAL  DATA:  Respiratory failure.  Subsequent encounter.  EXAM: PORTABLE CHEST - 1 VIEW  COMPARISON:  09/13/2014; 01/26/2011; 01/24/2011  FINDINGS: Examination is degraded due to portable technique and patient body habitus.  Grossly unchanged enlarged cardiac silhouette and mediastinal contours. Pulmonary vasculature appears less distinct with cephalization flow. Slight worsening of bibasilar heterogeneous opacities, right greater than left. No definite pleural effusion or pneumothorax. Unchanged bones.  IMPRESSION: Slight worsening of pulmonary edema and bibasilar opacities, right greater than left, atelectasis versus infiltrate. Further evaluation with a PA and lateral chest radiograph may be obtained as clinically indicated.   Electronically Signed   By: Sandi Mariscal M.D.   On: 09/14/2014 07:46   Dg Chest Port 1 View  09/13/2014   CLINICAL DATA:  [place] today; SOB today; pt states that she has been sick for about couple weeks, and today she fell onto her left ankle while she was trying to move; pain in left ankle, pt states that she had previous injury to the left ankle; no N/V, just felt sick; hx HTN; diabetic  EXAM: PORTABLE CHEST - 1 VIEW  COMPARISON:  01/26/2011  FINDINGS: Mild bilateral interstitial thickening. No pleural effusion or pneumothorax. Stable cardiomegaly. Unremarkable osseous structures.  IMPRESSION: Findings concerning for mild CHF.   Electronically Signed   By: Kathreen Devoid   On: 09/13/2014 17:24   Dg Ankle Left Port  09/13/2014   CLINICAL DATA:  63 year old female status post fall at home while trying to transfer into a chair. Initial encounter.  EXAM: PORTABLE LEFT ANKLE - 2 VIEW  COMPARISON:  None.  FINDINGS: Large body habitus and osteopenia. Calcaneus intact. Mortise joint alignment within normal limits and talar dome intact.  There is a corticated chronic appearing osseous fragment at the medial malleolus. However, there is a superimposed lucent acute appearing fracture through the more  proximal right tibia meta diaphysis (arrow on image 1). Minimal displacement. The posterior malleolus appears intact. There appears to be a chronic deformity of the lateral malleolus, no other acute fracture identified.  IMPRESSION: 1. Acute on chronic right tibia medial malleolus fracture, minimally displaced. 2. No other acute fracture or dislocation identified about the left ankle.   Electronically Signed   By: Lars Pinks M.D.   On: 09/13/2014 17:26    CBC  Recent Labs Lab 09/15/14 0515 09/16/14 0357 09/17/14 0304 09/18/14 0432 09/19/14 0819 09/20/14 0510  WBC 9.0 7.7 7.9 9.9 9.9 10.8*  HGB 7.6* 7.3* 7.2* 7.8* 8.3* 8.7*  HCT 21.7* 21.5* 21.8* 24.5* 25.7* 27.2*  PLT 220 256 266 289 275 300  MCV 85.1 86.3 89.0 90.4 92.1 91.3  MCH 29.8 29.3 29.4 28.8 29.7 29.2  MCHC 35.0 34.0 33.0 31.8 32.3  32.0  RDW 15.7* 15.9* 16.3* 16.4* 16.6* 16.9*  LYMPHSABS 0.5* 0.6* 0.7 0.8  --   --   MONOABS 0.8 0.6 0.6 0.7  --   --   EOSABS 0.0 0.0 0.0 0.0  --   --   BASOSABS 0.0 0.0 0.0 0.0  --   --     Chemistries   Recent Labs Lab 09/15/14 0515 09/16/14 0357 09/17/14 0304 09/18/14 0432 09/19/14 0500 09/20/14 0510  NA 137 138 141 140 141 142  K 3.5* 3.3* 3.3* 3.3* 3.8 3.8  CL 97 99 104 104 103 104  CO2 24 25 24 25 26 27   GLUCOSE 171* 169* 172* 187* 177* 178*  BUN 42* 35* 30* 24* 19 18  CREATININE 2.69* 1.64* 1.29* 1.02 0.96 0.91  CALCIUM 8.5 8.5 8.4 8.3* 8.4 8.5  MG 2.2 2.2 2.1 2.2  --   --    ------------------------------------------------------------------------------------------------------------------ estimated creatinine clearance is 127.2 ml/min (by C-G formula based on Cr of 0.91). ------------------------------------------------------------------------------------------------------------------ No results found for this basename: HGBA1C,  in the last 72 hours ------------------------------------------------------------------------------------------------------------------  Recent  Labs  09/21/14 0500  CHOL 131  HDL 25*  LDLCALC 77  TRIG 144  CHOLHDL 5.2   ------------------------------------------------------------------------------------------------------------------  Recent Labs  09/21/14 0521  TSH 0.007*   ------------------------------------------------------------------------------------------------------------------ No results found for this basename: VITAMINB12, FOLATE, FERRITIN, TIBC, IRON, RETICCTPCT,  in the last 72 hours  Coagulation profile No results found for this basename: INR, PROTIME,  in the last 168 hours  No results found for this basename: DDIMER,  in the last 72 hours  Cardiac Enzymes  Recent Labs Lab 09/15/14 0515 09/17/14 0312 09/17/14 1110  TROPONINI <0.30 <0.30 <0.30   ------------------------------------------------------------------------------------------------------------------ No components found with this basename: POCBNP,     Natalya Domzalski D.O. on 09/21/2014 at 11:05 AM  Between 7am to 7pm - Pager - [phone]  After 7pm go to [email or web address] - password [email or web address]  And look for the night coverage person covering for me after hours  [facility] Office  [phone]

## 2014-09-21 NOTE — Progress Notes (Signed)
INITIAL NUTRITION ASSESSMENT  DOCUMENTATION CODES Per approved criteria  -Morbid Obesity   INTERVENTION:  Provide Prostat liquid protein PO 30 ml BID with meals, each supplement provides 100 kcal, 15 grams protein.  RD to continue to monitor  NUTRITION DIAGNOSIS: Increased nutrient (protein) needs related to wound healing as evidenced by multiple wounds and stage 2 pressure ulcer.   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 and morbid obesity brought to ED by EMS with one week hx of malaise, anorexia, poor PO intake and suffered a fall with c/o L ankle pain.  Pt reports decreased appetite d/t fluid build up and feeling bloated. Pt reports some weight loss but this was fluid loss. Pt states she gets full quickly after eating. Emphasized the importance of eating protein foods to aid in wound healing.  Pt would like to receive protein supplement, will order Prostat 30 ml BID.  Nutrition focused physical exam shows no sign of depletion of muscle mass or body fat.  Labs reviewed: Glucose 178  Height: Ht Readings from Last 1 Encounters:  09/13/14 5\' 2"  (1.575 m)    Weight: Wt Readings from Last 1 Encounters:  09/21/14 518 lb 1.3 oz (235 kg)    Ideal Body Weight: 110 lb  % Ideal Body Weight: 471%  Wt Readings from Last 10 Encounters:  09/21/14 518 lb 1.3 oz (235 kg)    Usual Body Weight: NA  % Usual Body Weight: NA  BMI:  Body mass index is 94.73 kg/(m^2).  Estimated Nutritional Needs: Kcal: 2150-2350 Protein: 80-90g Fluid: 2L/day  Skin: Stage 2 Pressure ulcer, right hip wound, high thigh wound  Diet Order: Carb Control  EDUCATION  NEEDS: -Education needs addressed   Intake/Output Summary (Last 24 hours) at 09/21/14 1019 Last data filed at 09/21/14 0900  Gross per 24 hour  Intake   1780 ml  Output   3600 ml  Net  -1820 ml    Last BM: 10/25  Labs:   Recent Labs Lab 09/16/14 0357 09/17/14 0304 09/18/14 0432 09/19/14 0500 09/20/14 0510  NA 138 141 140 141 142  K 3.3* 3.3* 3.3* 3.8 3.8  CL 99 104 104 103 104  CO2 25 24 25 26 27   BUN 35* 30* 24* 19 18  CREATININE 1.64* 1.29* 1.02 0.96 0.91  CALCIUM 8.5 8.4 8.3* 8.4 8.5  MG 2.2 2.1 2.2  --   --   PHOS 2.9 2.6 2.5  --   --   GLUCOSE 169* 172* 187* 177* 178*    CBG (last 3)   Recent Labs  09/21/14 0016 09/21/14 0410 09/21/14 0819  GLUCAP 205* 189* 218*    Scheduled Meds: . aspirin EC  81 mg Oral Q breakfast  . carvedilol  6.25 mg Oral BID WC  . escitalopram  20 mg Oral Daily  . furosemide  40 mg Intravenous Q12H  . gabapentin  100 mg Oral BID  . heparin  5,000 Units Subcutaneous 3 times per day  . hydrocortisone sod succinate (SOLU-CORTEF) inj  100 mg Intravenous Q8H  . insulin aspart  0-20 Units Subcutaneous 6 times per day  . levothyroxine  175 mcg Oral QAC breakfast  .  lisinopril  2.5 mg Oral Daily  . rOPINIRole  1 mg Oral BID  . senna-docusate  2 tablet Oral BID    Continuous Infusions:   Past Medical History  Diagnosis Date  . Hypertension   . Diabetes mellitus without complication   . Obesity   . Hypothyroid   . Edema     Past Surgical History  Procedure Laterality Date  . Pituitary surgery  1984  . Back surgery    . Breast surgery      breast reduction    Clayton Bibles, MS, RD, LDN Pager: [phone] After Hours Pager: [phone]

## 2014-09-21 NOTE — Progress Notes (Signed)
Physical Therapy Treatment Patient Details Name: KINZLIE HARNEY MRN: [id number] DOB: 1951-01-16 Today's Date: 09/21/2014    History of Present Illness      PT Comments    Patient was pleasant and cooperative; she was agreeable to therapeutic exercises this session, but declined positioning in sitting.  Patient had no c/o pain during tx.  O2 sats were monitored throughout and were WNL on 5L at beginning of session and was lowered to 3L midway through where it remained.    Follow Up Recommendations        Equipment Recommendations       Recommendations for Other Services       Precautions / Restrictions Restrictions Weight Bearing Restrictions: Yes LLE Weight Bearing: Non weight bearing    Mobility  Bed Mobility                  Transfers                    Ambulation/Gait                 Stairs            Wheelchair Mobility    Modified Rankin (Stroke Patients Only)       Balance                                    Cognition                            Exercises General Exercises - Lower Extremity Ankle Circles/Pumps: AROM;Both;20 reps;Supine Quad Sets: AROM;Both;10 reps;Supine Gluteal Sets: AROM;Both;10 reps;Supine Short Arc Quad: AROM;Both;10 reps;Supine Heel Slides: AROM;Both;10 reps;Supine Hip ABduction/ADduction: AROM;Both;10 reps;Supine    General Comments        Pertinent Vitals/Pain Pain Assessment: No/denies pain    Home Living                      Prior Function            PT Goals (current goals can now be found in the care plan section)      Frequency       PT Plan      Co-evaluation             End of Session   Activity Tolerance: Patient tolerated treatment well Patient left: in bed     Time: 15:00-15:27    Charges:    2 TE                   G Codes:      Miller,Derrick  SPTA 09/21/2014, 4:12 PM Reviewed above  Rica Koyanagi  PTA [facility]   Acute  Rehab Pager      [phone]

## 2014-09-21 NOTE — Progress Notes (Signed)
No SNF bed offers at this time. [facility] will screen pt this afternoon. CSW [place] SN[place] of [place]. Family/pt are aware of need to  extent search at this time.  Werner Lean LCSW [phone]

## 2014-09-21 NOTE — Consult Note (Signed)
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  Cellulitis at right hip. Stage II PrU at sacrum Measurement:Sacrum is nearly reepithelialized. 3cm x 2cm x 0.1cm.  Areas of ITD are 50% improved using InterDry Ag+ and cellulitis is also dramatically improved on right hip. Wound bed: clean, pink and dry Drainage (amount, consistency, odor) Scant serous.  Right hip continues to weep at intervals, mostly due to positioning. Periwound: Bilateral hips with peau d'orange appearance, R>L Dressing procedure/placement/frequency: We will continue the InterDry Ag+ for intertriginous dermatitis and continue the soft silicone foam dressing for the sacral area.  Will use the conservative xeroform gauze topped by ABDs and secured with tape and changed daily to the right hip.  The bariatric therapeutic sleep surface with low air loss feature should remain in place. [place] nursing team will not follow, but will remain available to this patient, the nursing and medical teams.  Please re-consult if needed. Thanks, Maudie Flakes, MSN, RN, [place], [place], [place] [phone])

## 2014-09-22 LAB — BASIC METABOLIC PANEL
Anion gap: 9 (ref 5–15)
BUN: 20 mg/dL (ref 6–23)
CHLORIDE: 100 meq/L (ref 96–112)
CO2: 34 mEq/L — ABNORMAL HIGH (ref 19–32)
Calcium: 8.2 mg/dL — ABNORMAL LOW (ref 8.4–10.5)
Creatinine, Ser: 0.93 mg/dL (ref 0.50–1.10)
GFR calc Af Amer: 75 mL/min — ABNORMAL LOW (ref >90)
GFR, EST NON AFRICAN AMERICAN: 65 mL/min — AB (ref >90)
GLUCOSE: 211 mg/dL — AB (ref 70–99)
POTASSIUM: 3.4 meq/L — AB (ref 3.7–5.3)
Sodium: 143 mEq/L (ref 137–147)

## 2014-09-22 LAB — CBC
HCT: 26.6 % — ABNORMAL LOW (ref 36.0–46.0)
HEMOGLOBIN: 8.5 g/dL — AB (ref 12.0–15.0)
MCH: 29.7 pg (ref 26.0–34.0)
MCHC: 32 g/dL (ref 30.0–36.0)
MCV: 93 fL (ref 78.0–100.0)
Platelets: 226 10*3/uL (ref 150–400)
RBC: 2.86 MIL/uL — AB (ref 3.87–5.11)
RDW: 16.7 % — ABNORMAL HIGH (ref 11.5–15.5)
WBC: 8.7 10*3/uL (ref 4.0–10.5)

## 2014-09-22 LAB — GLUCOSE, CAPILLARY
GLUCOSE-CAPILLARY: 254 mg/dL — AB (ref 70–99)
Glucose-Capillary: 206 mg/dL — ABNORMAL HIGH (ref 70–99)
Glucose-Capillary: 210 mg/dL — ABNORMAL HIGH (ref 70–99)
Glucose-Capillary: 209 mg/dL — ABNORMAL HIGH (ref 70–99)
Glucose-Capillary: 217 mg/dL — ABNORMAL HIGH (ref 70–99)

## 2014-09-22 MED ORDER — LISINOPRIL 10 MG PO TABS
10.0000 mg | ORAL_TABLET | Freq: Every day | ORAL | Status: DC
  Administered 2014-09-22: 10 mg via ORAL
  Filled 2014-09-22 (×2): qty 1

## 2014-09-22 MED ORDER — POTASSIUM CHLORIDE CRYS ER 20 MEQ PO TBCR
20.0000 meq | EXTENDED_RELEASE_TABLET | Freq: Two times a day (BID) | ORAL | Status: DC
  Administered 2014-09-22 (×2): 20 meq via ORAL
  Filled 2014-09-22 (×4): qty 1

## 2014-09-22 MED ORDER — HYDROCORTISONE NA SUCCINATE PF 100 MG IJ SOLR
50.0000 mg | Freq: Three times a day (TID) | INTRAMUSCULAR | Status: DC
  Administered 2014-09-23: 50 mg via INTRAVENOUS
  Filled 2014-09-22 (×4): qty 1

## 2014-09-22 MED ORDER — SPIRONOLACTONE 25 MG PO TABS
25.0000 mg | ORAL_TABLET | Freq: Every day | ORAL | Status: DC
  Administered 2014-09-22 – 2014-09-24 (×3): 25 mg via ORAL
  Filled 2014-09-22 (×3): qty 1

## 2014-09-22 MED ORDER — INSULIN ASPART 100 UNIT/ML ~~LOC~~ SOLN
0.0000 [IU] | Freq: Three times a day (TID) | SUBCUTANEOUS | Status: DC
  Administered 2014-09-23: 7 [IU] via SUBCUTANEOUS
  Administered 2014-09-23 – 2014-09-24 (×3): 4 [IU] via SUBCUTANEOUS
  Administered 2014-09-24: 7 [IU] via SUBCUTANEOUS

## 2014-09-22 MED ORDER — INSULIN ASPART 100 UNIT/ML ~~LOC~~ SOLN
0.0000 [IU] | Freq: Every day | SUBCUTANEOUS | Status: DC
  Administered 2014-09-22 – 2014-09-23 (×2): 3 [IU] via SUBCUTANEOUS

## 2014-09-22 MED ORDER — INSULIN GLARGINE 100 UNIT/ML ~~LOC~~ SOLN
10.0000 [IU] | Freq: Every day | SUBCUTANEOUS | Status: DC
  Administered 2014-09-22 – 2014-09-23 (×2): 10 [IU] via SUBCUTANEOUS
  Filled 2014-09-22 (×4): qty 0.1

## 2014-09-22 NOTE — Progress Notes (Signed)
PROGRESS NOTE  Kathy Simpson CNO:[id number] DOB: [birth year] DOA: 09/13/2014 PCP: [name], MD  Assessment/Plan: Acute systolic congestive heart failure  -Upon admission patient was noted to be in septic shock with AKI, and was given several liters of fluid  -Patient had no history of CHF  -Patient began to experience dyspnea  -Upon admission BNP was elevated, 1649  -Echocardiogram obtained: EF 30-35%  -Continue lasix 40mg  IV BID (monitor Cr closely)  -Cardiology consulted and appreciated and agreed with lasix, increased Coreg to 6.25mg  BID, and lisinopril increased to 10mg  -spironolactone added 25mg  daily -Monitor daily weights, intake/output Dyspnea  -multifactorial secondary to OHS/OSA, CHF -May also be complicated by body habitus  -Encourage incentive spirometry and sitting up as much as possible  Diabetes Mellitus -check A1c -add lantus 10units -will not restart actos at d/c due to CHF -CBGs elevated partly due to steroids -wean solucortef Septic shock  -Resolved  -Thought to be secondary to urinary tract infection  -There is a question of whether blood pressure readings are accurate, patient did have A-line inserted, however removed on 10/23  -Patient completed 7 days of vancomycin and Zosyn, antibiotics discontinued on 10/25  Urinary tract infection  -Urine culture shows no growth  -Patient has no leukocytosis and is currently afebrile  -Completed antibiotic course  Acute renal failure  -Appears to have resolved with IV fluid hydration  -Likely secondary to sepsis including hypotension versus prerenal causes  -Creatinine was 5.7 at time of admission, currently 0.91  -IV fluids discontinued 09/19/2014  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, 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  -TSH suppressed  Obstructive sleep apnea  -Continue CPAP each bedtime  Left ankle fracture  -Orthopedic surgery consulted and appreciated and did not recommend surgery at this time    -cast in place  -Patient is to followup within one to 2 weeks  -No weightbearing on the lower extremity  Physical deconditioning with frequent falls  -PT and OT consulted and recommended SNF  -SW consulted for placement  Morbid obesity  -This should be addressed by her primary care physician  Chronic anemia  -Hemoglobin appears to be at baseline  -will continue to monitor CBC  -Patient currently hemodynamically stable  Sacral decub, stage II/ hip wound  -Wound care consulted  Code Status: Full  Family Communication: None at bedside  Disposition Plan: Admitted, Pending SNF placement         Procedures/Studies: Dg Chest Port 1 View  09/14/2014   CLINICAL DATA:  Respiratory failure.  Subsequent encounter.  EXAM: PORTABLE CHEST - 1 VIEW  COMPARISON:  09/13/2014; 01/26/2011; 01/24/2011  FINDINGS: Examination is degraded due to portable technique and patient body habitus.  Grossly unchanged enlarged cardiac silhouette and mediastinal contours. Pulmonary vasculature appears less distinct with cephalization flow. Slight worsening of bibasilar heterogeneous opacities, right greater than left. No definite pleural effusion or pneumothorax. Unchanged bones.  IMPRESSION: Slight worsening of pulmonary edema and bibasilar opacities, right greater than left, atelectasis versus infiltrate. Further evaluation with a PA and lateral chest radiograph may be obtained as clinically indicated.   Electronically Signed   By: Sandi Mariscal M.D.   On: 09/14/2014 07:46   Dg Chest Port 1 View  09/13/2014   CLINICAL DATA:  [place] today; SOB today; pt states that she has been sick for about couple weeks, and today she fell onto her left ankle while she was trying to move; pain in left ankle, pt states that she had  previous injury to the left ankle; no N/V, just felt sick; hx HTN; diabetic  EXAM: PORTABLE CHEST - 1 VIEW  COMPARISON:  01/26/2011  FINDINGS: Mild bilateral interstitial thickening. No pleural effusion or  pneumothorax. Stable cardiomegaly. Unremarkable osseous structures.  IMPRESSION: Findings concerning for mild CHF.   Electronically Signed   By: Kathreen Devoid   On: 09/13/2014 17:24   Dg Ankle Left Port  09/13/2014   CLINICAL DATA:  63 year old female status post fall at home while trying to transfer into a chair. Initial encounter.  EXAM: PORTABLE LEFT ANKLE - 2 VIEW  COMPARISON:  None.  FINDINGS: Large body habitus and osteopenia. Calcaneus intact. Mortise joint alignment within normal limits and talar dome intact.  There is a corticated chronic appearing osseous fragment at the medial malleolus. However, there is a superimposed lucent acute appearing fracture through the more proximal right tibia meta diaphysis (arrow on image 1). Minimal displacement. The posterior malleolus appears intact. There appears to be a chronic deformity of the lateral malleolus, no other acute fracture identified.  IMPRESSION: 1. Acute on chronic right tibia medial malleolus fracture, minimally displaced. 2. No other acute fracture or dislocation identified about the left ankle.   Electronically Signed   By: Lars Pinks M.D.   On: 09/13/2014 17:26         Subjective: Patient is breathing better today. Denies any fevers, chills, chest pain, chest breath, nausea, vomiting, diarrhea, abdominal pain. Ankle pain is controlled.  Objective: Filed Vitals:   09/22/14 0518 09/22/14 1530 09/22/14 1650 09/22/14 1653  BP: 156/61 150/65 129/59 129/59  Pulse: 63 66 70 70  Temp: 98.6 F (37 C) 98.3 F (36.8 C)  98 F (36.7 C)  TempSrc: Oral Oral  Oral  Resp: 19 20  20   Height:      Weight:      SpO2: 99% 98%  98%    Intake/Output Summary (Last 24 hours) at 09/22/14 1825 Last data filed at 09/22/14 0519  Gross per 24 hour  Intake    240 ml  Output   3050 ml  Net  -2810 ml   Weight change:  Exam:   General:  Pt is alert, follows commands appropriately, not in acute distress  HEENT: No icterus, No thrush,   [place]/AT  Cardiovascular: RRR, S1/S2, no rubs, no gallops  Respiratory: Diminished breath sounds at the bases.  Abdomen: Soft/+BS, non tender, non distended, no guarding  Extremities: 2+LE edema, No lymphangitis, No petechiae, No rashes, no synovitis  Data Reviewed: Basic Metabolic Panel:  Recent Labs Lab 09/16/14 0357 09/17/14 0304 09/18/14 0432 09/19/14 0500 09/20/14 0510 09/22/14 0430  NA 138 141 140 141 142 143  K 3.3* 3.3* 3.3* 3.8 3.8 3.4*  CL 99 104 104 103 104 100  CO2 25 24 25 26 27  34*  GLUCOSE 169* 172* 187* 177* 178* 211*  BUN 35* 30* 24* 19 18 20   CREATININE 1.64* 1.29* 1.02 0.96 0.91 0.93  CALCIUM 8.5 8.4 8.3* 8.4 8.5 8.2*  MG 2.2 2.1 2.2  --   --   --   PHOS 2.9 2.6 2.5  --   --   --    Liver Function Tests: No results found for this basename: AST, ALT, ALKPHOS, BILITOT, PROT, ALBUMIN,  in the last 168 hours No results found for this basename: LIPASE, AMYLASE,  in the last 168 hours No results found for this basename: AMMONIA,  in the last 168 hours CBC:  Recent Labs Lab 09/16/14  7829 09/17/14 0304 09/18/14 0432 09/19/14 0819 09/20/14 0510 09/22/14 0430  WBC 7.7 7.9 9.9 9.9 10.8* 8.7  NEUTROABS 6.6 6.6 8.4*  --   --   --   HGB 7.3* 7.2* 7.8* 8.3* 8.7* 8.5*  HCT 21.5* 21.8* 24.5* 25.7* 27.2* 26.6*  MCV 86.3 89.0 90.4 92.1 91.3 93.0  PLT 256 266 289 275 300 226   Cardiac Enzymes:  Recent Labs Lab 09/17/14 0312 09/17/14 1110  TROPONINI <0.30 <0.30   BNP: No components found with this basename: POCBNP,  CBG:  Recent Labs Lab 09/21/14 2326 09/22/14 0319 09/22/14 0739 09/22/14 1223 09/22/14 1707  GLUCAP 212* 206* 210* 209* 217*    Recent Results (from the past 240 hour(s))  CULTURE, BLOOD (ROUTINE X 2)     Status: None   Collection Time    09/13/14  3:43 PM      Result Value Ref Range Status   Specimen Description BLOOD RIGHT ANTECUBITAL   Final   Special Requests BOTTLES DRAWN AEROBIC AND ANAEROBIC 4CC   Final   Culture  Setup  Time     Final   Value: 09/13/2014 18:50     Performed at [facility]   Culture     Final   Value: NO GROWTH 5 DAYS     Performed at [facility]   Report Status 09/19/2014 FINAL   Final  CULTURE, BLOOD (ROUTINE X 2)     Status: None   Collection Time    09/13/14  3:52 PM      Result Value Ref Range Status   Specimen Description BLOOD RIGHT FOREARM   Final   Special Requests BOTTLES DRAWN AEROBIC ONLY 3CC   Final   Culture  Setup Time     Final   Value: 09/13/2014 18:50     Performed at [facility]   Culture     Final   Value: NO GROWTH 5 DAYS     Performed at [facility]   Report Status 09/19/2014 FINAL   Final  URINE CULTURE     Status: None   Collection Time    09/13/14  4:49 PM      Result Value Ref Range Status   Specimen Description URINE, CATHETERIZED   Final   Special Requests NONE   Final   Culture  Setup Time     Final   Value: 09/14/2014 00:16     Performed at [place]     Final   Value: NO GROWTH     Performed at [facility]   Culture     Final   Value: NO GROWTH     Performed at [facility]   Report Status 09/15/2014 FINAL   Final  MRSA PCR SCREENING     Status: None   Collection Time    09/13/14  9:55 PM      Result Value Ref Range Status   MRSA by PCR NEGATIVE  NEGATIVE Final   Comment:            The GeneXpert MRSA Assay (FDA     approved for NASAL specimens     only), is one component of a     comprehensive MRSA colonization     surveillance program. It is not     intended to diagnose MRSA     infection nor to guide or     monitor treatment for     MRSA  infections.  CLOSTRIDIUM DIFFICILE BY PCR     Status: None   Collection Time    09/20/14  7:59 PM      Result Value Ref Range Status   C difficile by pcr NEGATIVE  NEGATIVE Final   Comment: Performed at [facility]     Scheduled Meds: . aspirin EC  81 mg Oral Q breakfast  . carvedilol  6.25 mg Oral BID  WC  . escitalopram  20 mg Oral Daily  . feeding supplement (PRO-STAT SUGAR FREE 64)  30 mL Oral BID WC  . furosemide  40 mg Intravenous Q12H  . gabapentin  100 mg Oral BID  . heparin  5,000 Units Subcutaneous 3 times per day  . [START ON 09/23/2014] hydrocortisone sod succinate (SOLU-CORTEF) inj  50 mg Intravenous Q8H  . [START ON 09/23/2014] insulin aspart  0-20 Units Subcutaneous TID WC  . insulin aspart  0-5 Units Subcutaneous QHS  . insulin glargine  10 Units Subcutaneous QHS  . levothyroxine  175 mcg Oral QAC breakfast  . lisinopril  10 mg Oral Daily  . potassium chloride  20 mEq Oral BID  . rOPINIRole  1 mg Oral BID  . senna-docusate  2 tablet Oral BID  . spironolactone  25 mg Oral Daily   Continuous Infusions:    Daksh Coates, DO  [facility] Pager [phone]  If 7PM-7AM, please contact night-coverage [email or web address] Password [email or web address] 09/22/2014, 6:25 PM   LOS: 9 days

## 2014-09-22 NOTE — Progress Notes (Signed)
Inpatient Diabetes Program Recommendations  AACE/ADA: New Consensus Statement on Inpatient Glycemic Control (2013)  Target Ranges:  Prepandial:   less than 140 mg/dL      Peak postprandial:   less than 180 mg/dL (1-2 hours)      Critically ill patients:  140 - 180 mg/dL   Reason for Visit: Hyperglycemia  Diabetes history: DM2 Outpatient Diabetes medications: Actos 22.5 mg at HS Current orders for Inpatient glycemic control: Novolog resistant Q4H On Solucortef 100 mg Q8H. Results for Kathy Simpson, Kathy Simpson (MRN [id number]) as of 09/22/2014 13:57  Ref. Range 09/21/2014 20:05 09/21/2014 23:26 09/22/2014 03:19 09/22/2014 07:39  Glucose-Capillary Latest Range: 70-99 mg/dL 236 (H) 212 (H) 206 (H) 210 (H)   Needs meal coverage insulin.  Recommendations: Add Novolog 4 units tidwc for meal coverage insulin if pt eats >50% meal.  Will continue to follow. Thank you. Lorenda Peck, RD, LDN, CDE Inpatient Diabetes Coordinator [phone]

## 2014-09-22 NOTE — Progress Notes (Signed)
CSW assisting with d/c planning. [facility] is unable to offer SNF bed. Family is touring their sister facility, [facility] tonite. [facility] is able to offer placement if pt is interested. CSW will meet with pt in the am to continue with d/c planning.  Werner Lean LCSW [phone]

## 2014-09-22 NOTE — Progress Notes (Signed)
    Subjective:  Denies CP or dyspnea   Objective:  Filed Vitals:   09/21/14 2056 09/21/14 2353 09/22/14 0115 09/22/14 0518  BP: 151/60  150/59 156/61  Pulse: 73 79 67 63  Temp: 98.5 F (36.9 C)  97.9 F (36.6 C) 98.6 F (37 C)  TempSrc: Oral  Oral Oral  Resp: 19 20 20 19   Height:      Weight:      SpO2: 97% 98% 94% 99%    Intake/Output from previous day:  Intake/Output Summary (Last 24 hours) at 09/22/14 0533 Last data filed at 09/22/14 0519  Gross per 24 hour  Intake    720 ml  Output   5450 ml  Net  -4730 ml    Physical Exam: Physical exam: Well-developed morbidly obese in no acute distress.  Skin is warm and dry.  HEENT is normal.  Neck is supple.  Chest is clear to auscultation with normal expansion.  Cardiovascular exam is regular rate and rhythm.  Abdominal exam nontender or distended. No masses palpated. Extremities show 1+ edema. neuro grossly intact    Lab Results: Basic Metabolic Panel:  Recent Labs  09/20/14 0510 09/22/14 0430  NA 142 143  K 3.8 3.4*  CL 104 100  CO2 27 34*  GLUCOSE 178* 211*  BUN 18 20  CREATININE 0.91 0.93  CALCIUM 8.5 8.2*   CBC:  Recent Labs  09/20/14 0510 09/22/14 0430  WBC 10.8* 8.7  HGB 8.7* 8.5*  HCT 27.2* 26.6*  MCV 91.3 93.0  PLT 300 226    Assessment/Plan:  1 acute systolic congestive heart failure-Patient remains volume overloaded on examination. However she is morbidly obese and total volume status difficult to assess. Continue Lasix 40 mg IV twice a day and follow renal function. Add spironolactone 25 mg po daily. Continue carvedilol 6.25 mg by mouth twice a day. Increase lisinopril to 10 mg daily. Titrate medications as tolerated by pulse and blood pressure. Etiology of cardiomyopathy is unclear. She would certainly be at risk for coronary disease. However given her morbid obesity and limited mobility ischemia evaluation would be difficult. Her TSH is markedly decreased but in the setting of  panhypopituitarism not helpful. For now, plan titration of meds and then repeat echo in 3 months; if LV function remains decreased, will consider ischemia eval at that time. Note given present condition, she clearly would not be a candidate for CABG. 2 urosepsis-continue antibiotics per primary care. 3 acute renal failure-resolved with hydration and treatment of urinary tract infection. Follow renal function closely with increasing ACEI. 4 morbid obesity-her obesity is life-threatening. We discussed weight loss. 5 diabetes mellitus-management per primary care. 6 obstructive sleep apnea 7 Panhypopituitarism-continue preadmission meds. 8 hypokalemia-supplement  [name] 09/22/2014, 5:33 AM

## 2014-09-23 LAB — HEMOGLOBIN A1C
Hgb A1c MFr Bld: 6.3 % — ABNORMAL HIGH (ref <5.7)
Mean Plasma Glucose: 134 mg/dL — ABNORMAL HIGH (ref <117)

## 2014-09-23 LAB — GLUCOSE, CAPILLARY
GLUCOSE-CAPILLARY: 186 mg/dL — AB (ref 70–99)
GLUCOSE-CAPILLARY: 174 mg/dL — AB (ref 70–99)
GLUCOSE-CAPILLARY: 245 mg/dL — AB (ref 70–99)
Glucose-Capillary: 208 mg/dL — ABNORMAL HIGH (ref 70–99)

## 2014-09-23 LAB — BASIC METABOLIC PANEL
Anion gap: 10 (ref 5–15)
BUN: 21 mg/dL (ref 6–23)
CO2: 36 mEq/L — ABNORMAL HIGH (ref 19–32)
CREATININE: 0.86 mg/dL (ref 0.50–1.10)
Calcium: 8.3 mg/dL — ABNORMAL LOW (ref 8.4–10.5)
Chloride: 100 mEq/L (ref 96–112)
GFR calc Af Amer: 83 mL/min — ABNORMAL LOW (ref >90)
GFR, EST NON AFRICAN AMERICAN: 71 mL/min — AB (ref >90)
Glucose, Bld: 215 mg/dL — ABNORMAL HIGH (ref 70–99)
Potassium: 3.9 mEq/L (ref 3.7–5.3)
Sodium: 146 mEq/L (ref 137–147)

## 2014-09-23 MED ORDER — POTASSIUM CHLORIDE CRYS ER 20 MEQ PO TBCR
20.0000 meq | EXTENDED_RELEASE_TABLET | Freq: Every day | ORAL | Status: DC
  Administered 2014-09-23 – 2014-09-24 (×2): 20 meq via ORAL
  Filled 2014-09-23 (×2): qty 1

## 2014-09-23 MED ORDER — HYDROCORTISONE NA SUCCINATE PF 100 MG IJ SOLR
50.0000 mg | Freq: Two times a day (BID) | INTRAMUSCULAR | Status: DC
  Administered 2014-09-23 – 2014-09-24 (×2): 50 mg via INTRAVENOUS
  Filled 2014-09-23 (×4): qty 1

## 2014-09-23 MED ORDER — FUROSEMIDE 40 MG PO TABS
40.0000 mg | ORAL_TABLET | Freq: Every day | ORAL | Status: DC
  Administered 2014-09-23 – 2014-09-24 (×2): 40 mg via ORAL
  Filled 2014-09-23 (×2): qty 1

## 2014-09-23 MED ORDER — LISINOPRIL 20 MG PO TABS
20.0000 mg | ORAL_TABLET | Freq: Every day | ORAL | Status: DC
  Administered 2014-09-23 – 2014-09-24 (×2): 20 mg via ORAL
  Filled 2014-09-23 (×2): qty 1

## 2014-09-23 NOTE — Progress Notes (Signed)
    Subjective:  Denies CP or dyspnea   Objective:  Filed Vitals:   09/22/14 2208 09/23/14 0208 09/23/14 0612 09/23/14 0613  BP: 143/62 143/61 154/65   Pulse: 69 71 75   Temp: 98.2 F (36.8 C) 97.7 F (36.5 C) 97.9 F (36.6 C)   TempSrc: Oral Oral Oral   Resp: 21 20 19    Height:      Weight:    228 lb (103.42 kg)  SpO2: 98% 99% 98%     Intake/Output from previous day:  Intake/Output Summary (Last 24 hours) at 09/23/14 7353 Last data filed at 09/23/14 0618  Gross per 24 hour  Intake    240 ml  Output   1800 ml  Net  -1560 ml    Physical Exam: Physical exam: Well-developed morbidly obese in no acute distress.  Skin is warm and dry.  HEENT is normal.  Neck is supple.  Chest is clear to auscultation with normal expansion.  Cardiovascular exam is regular rate and rhythm.  Abdominal exam nontender or distended. No masses palpated. Extremities show trace to 1+ edema. neuro grossly intact    Lab Results: Basic Metabolic Panel:  Recent Labs  09/22/14 0430 09/23/14 0440  NA 143 146  K 3.4* 3.9  CL 100 100  CO2 34* 36*  GLUCOSE 211* 215*  BUN 20 21  CREATININE 0.93 0.86  CALCIUM 8.2* 8.3*   CBC:  Recent Labs  09/22/14 0430  WBC 8.7  HGB 8.5*  HCT 26.6*  MCV 93.0  PLT 226    Assessment/Plan:  1 acute systolic congestive heart failure-Patient's volume status difficult to assess but appears to be improving. Change Lasix to 40 mg daily. Continue spironolactone 25 mg po daily. Continue carvedilol 6.25 mg by mouth twice a day. Increase lisinopril to 20 mg daily. Titrate medications as tolerated by pulse and blood pressure as outpatient. Check BMET one week following DC with results to me. Etiology of cardiomyopathy is unclear. She would certainly be at risk for coronary disease. However given her morbid obesity and limited mobility, ischemia evaluation would be difficult. Her TSH is markedly decreased but in the setting of panhypopituitarism not helpful. For  now, plan titration of meds and then repeat echo in 3 months; if LV function remains decreased, will consider ischemia eval at that time. Note given present condition, she clearly would not be a candidate for CABG. Patient can be DCed from a cardiac standpoint; please arrange FU with me in 2-4 weeks. 2 urosepsis- antibiotics per primary care. 3 acute renal failure-resolved with hydration and treatment of urinary tract infection. Follow renal function closely as outpatient. 4 morbid obesity-her obesity is life-threatening. We discussed weight loss. 5 diabetes mellitus-management per primary care. 6 obstructive sleep apnea 7 Panhypopituitarism-continue preadmission meds. 8 hypokalemia-supplement Please call with questions [name] 09/23/2014, 6:25 AM

## 2014-09-23 NOTE — Progress Notes (Signed)
PROGRESS NOTE  Kathy Simpson [id number]:[id number] DOB: February 11, 1951 DOA: 09/13/2014 PCP: [name], MD  Assessment/Plan: Acute systolic congestive heart failure  -Upon admission patient was noted to be in septic shock with AKI, and was given several liters of fluid  -Patient had no history of CHF  -Patient began to experience dyspnea  -Upon admission BNP was elevated, 1649  -Echocardiogram obtained: EF 30-35%  -started on lasix 40mg  IV BID  -09/23/2014--Lasix changed to 40 mg po daily -Cardiology consulted and appreciated and agreed with lasix, increased Coreg to 6.25mg  BID -09/23/2014 lisinopril increased to 20mg   -09/22/2014 spironolactone added 25mg  daily  -Monitor daily weights, intake/output  Dyspnea  -multifactorial secondary to OHS/OSA, CHF  -May also be complicated by body habitus  -Encourage incentive spirometry and sitting up as much as possible  -Patient presently stable on 2 L nasal cannula- Diabetes Mellitus  -check A1c--pending  -add lantus 10units--continue same dose as steroids are being decreased presently. -will not restart actos at d/c due to CHF  -CBGs elevated partly due to steroids  -wean solucortef  -due to symptomatic CHF, will not restart pioglitazone  Septic shock  -Resolved  -Thought to be secondary to urinary tract infection  -There is a question of whether blood pressure readings are accurate, patient did have A-line inserted, however removed on 10/23  -Patient completed 7 days of vancomycin and Zosyn, antibiotics discontinued on 10/25  Urinary tract infection  -Urine culture shows no growth  -Patient has no leukocytosis and is currently afebrile  -Completed antibiotic course  Acute renal failure  -Appears to have resolved with IV fluid hydration  -Likely secondary to sepsis including hypotension versus prerenal causes  -Creatinine was 5.7 at time of admission, currently 0.91  -IV fluids discontinued 09/19/2014    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, 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  -TSH suppressed  Obstructive sleep apnea  -Continue CPAP each bedtime  Left ankle fracture  -Orthopedic surgery consulted and appreciated and did not recommend surgery at this time  -cast in place  -Patient is to followup within one to 2 weeks  -No weightbearing on the lower extremity  Physical deconditioning with frequent falls  -PT and OT consulted and recommended SNF  -SW consulted for placement  -followup with [name] in 2 weeks Morbid obesity  -addressed by her primary care physician  Chronic anemia  -Hemoglobin appears to be at baseline  -will continue to monitor CBC  -Patient currently hemodynamically stable  Sacral decub, stage II/ hip wound  -Wound care consulted  -use the conservative xeroform gauze topped by ABDs and secured with tape and changed daily to the right hip. The bariatric therapeutic sleep surface with low air loss feature should remain in place. -InterDry Ag+ for intertriginous dermatitis and continue the soft silicone foam dressing for the sacral area.   Code Status: Full  Family Communication: Updated daughter on 09/22/14 Disposition Plan:  SNF on 09/24/14        Procedures/Studies: Dg Chest Port 1 View  09/14/2014   CLINICAL DATA:  Respiratory failure.  Subsequent encounter.  EXAM: PORTABLE CHEST - 1 VIEW  COMPARISON:  09/13/2014; 01/26/2011; 01/24/2011  FINDINGS: Examination is degraded due to portable technique and patient body habitus.  Grossly unchanged enlarged cardiac silhouette and mediastinal contours. Pulmonary vasculature appears less distinct with cephalization flow. Slight worsening of bibasilar heterogeneous opacities, right greater than left. No definite pleural effusion or pneumothorax. Unchanged bones.  IMPRESSION: Slight worsening of pulmonary edema  and bibasilar opacities, right greater than left, atelectasis versus infiltrate. Further evaluation with a PA and  lateral chest radiograph may be obtained as clinically indicated.   Electronically Signed   By: Sandi Mariscal M.D.   On: 09/14/2014 07:46   Dg Chest Port 1 View  09/13/2014   CLINICAL DATA:  [place] today; SOB today; pt states that she has been sick for about couple weeks, and today she fell onto her left ankle while she was trying to move; pain in left ankle, pt states that she had previous injury to the left ankle; no N/V, just felt sick; hx HTN; diabetic  EXAM: PORTABLE CHEST - 1 VIEW  COMPARISON:  01/26/2011  FINDINGS: Mild bilateral interstitial thickening. No pleural effusion or pneumothorax. Stable cardiomegaly. Unremarkable osseous structures.  IMPRESSION: Findings concerning for mild CHF.   Electronically Signed   By: Kathreen Devoid   On: 09/13/2014 17:24   Dg Ankle Left Port  09/13/2014   CLINICAL DATA:  62 year old female status post fall at home while trying to transfer into a chair. Initial encounter.  EXAM: PORTABLE LEFT ANKLE - 2 VIEW  COMPARISON:  None.  FINDINGS: Large body habitus and osteopenia. Calcaneus intact. Mortise joint alignment within normal limits and talar dome intact.  There is a corticated chronic appearing osseous fragment at the medial malleolus. However, there is a superimposed lucent acute appearing fracture through the more proximal right tibia meta diaphysis (arrow on image 1). Minimal displacement. The posterior malleolus appears intact. There appears to be a chronic deformity of the lateral malleolus, no other acute fracture identified.  IMPRESSION: 1. Acute on chronic right tibia medial malleolus fracture, minimally displaced. 2. No other acute fracture or dislocation identified about the left ankle.   Electronically Signed   By: Lars Pinks M.D.   On: 09/13/2014 17:26         Subjective: Patient become short of breath with minimal exertion. Overall breathing is better. Denies any fevers, chills, chest pain, nausea, vomiting, diarrhea, abdominal pain    Objective: Filed Vitals:   09/22/14 2208 09/23/14 0208 09/23/14 0612 09/23/14 0613  BP: 143/62 143/61 154/65   Pulse: 69 71 75   Temp: 98.2 F (36.8 C) 97.7 F (36.5 C) 97.9 F (36.6 C)   TempSrc: Oral Oral Oral   Resp: 21 20 19    Height:      Weight:    103.42 kg (228 lb)  SpO2: 98% 99% 98%     Intake/Output Summary (Last 24 hours) at 09/23/14 0759 Last data filed at 09/23/14 0618  Gross per 24 hour  Intake    240 ml  Output   1800 ml  Net  -1560 ml   Weight change:  Exam:   General:  Pt is alert, follows commands appropriately, not in acute distress  HEENT: No icterus, No thrush,[place]/AT  Cardiovascular: RRR, S1/S2, no rubs, no gallops  Respiratory: CTA bilaterally anterior, no wheezing, no crackles, no rhonchi  Abdomen: Soft/+BS, non tender, non distended, no guarding  Extremities: 2+ edema, No lymphangitis, No petechiae, No rashes, no synovitis;  Left ankle in cast  Data Reviewed: Basic Metabolic Panel:  Recent Labs Lab 09/17/14 0304 09/18/14 0432 09/19/14 0500 09/20/14 0510 09/22/14 0430 09/23/14 0440  NA 141 140 141 142 143 146  K 3.3* 3.3* 3.8 3.8 3.4* 3.9  CL 104 104 103 104 100 100  CO2 24 25 26 27  34* 36*  GLUCOSE 172* 187* 177* 178* 211* 215*  BUN 30* 24*  19 18 20 21   CREATININE 1.29* 1.02 0.96 0.91 0.93 0.86  CALCIUM 8.4 8.3* 8.4 8.5 8.2* 8.3*  MG 2.1 2.2  --   --   --   --   PHOS 2.6 2.5  --   --   --   --    Liver Function Tests: No results found for this basename: AST, ALT, ALKPHOS, BILITOT, PROT, ALBUMIN,  in the last 168 hours No results found for this basename: LIPASE, AMYLASE,  in the last 168 hours No results found for this basename: AMMONIA,  in the last 168 hours CBC:  Recent Labs Lab 09/17/14 0304 09/18/14 0432 09/19/14 0819 09/20/14 0510 09/22/14 0430  WBC 7.9 9.9 9.9 10.8* 8.7  NEUTROABS 6.6 8.4*  --   --   --   HGB 7.2* 7.8* 8.3* 8.7* 8.5*  HCT 21.8* 24.5* 25.7* 27.2* 26.6*  MCV 89.0 90.4 92.1 91.3 93.0  PLT 266  289 275 300 226   Cardiac Enzymes:  Recent Labs Lab 09/17/14 0312 09/17/14 1110  TROPONINI <0.30 <0.30   BNP: No components found with this basename: POCBNP,  CBG:  Recent Labs Lab 09/22/14 0739 09/22/14 1223 09/22/14 1707 09/22/14 2213 09/23/14 0730  GLUCAP 210* 209* 217* 254* 186*    Recent Results (from the past 240 hour(s))  CULTURE, BLOOD (ROUTINE X 2)     Status: None   Collection Time    09/13/14  3:43 PM      Result Value Ref Range Status   Specimen Description BLOOD RIGHT ANTECUBITAL   Final   Special Requests BOTTLES DRAWN AEROBIC AND ANAEROBIC 4CC   Final   Culture  Setup Time     Final   Value: 09/13/2014 18:50     Performed at [facility]     Final   Value: NO GROWTH 5 DAYS     Performed at [facility]   Report Status 09/19/2014 FINAL   Final  CULTURE, BLOOD (ROUTINE X 2)     Status: None   Collection Time    09/13/14  3:52 PM      Result Value Ref Range Status   Specimen Description BLOOD RIGHT FOREARM   Final   Special Requests BOTTLES DRAWN AEROBIC ONLY 3CC   Final   Culture  Setup Time     Final   Value: 09/13/2014 18:50     Performed at [facility]     Final   Value: NO GROWTH 5 DAYS     Performed at [facility]   Report Status 09/19/2014 FINAL   Final  URINE CULTURE     Status: None   Collection Time    09/13/14  4:49 PM      Result Value Ref Range Status   Specimen Description URINE, CATHETERIZED   Final   Special Requests NONE   Final   Culture  Setup Time     Final   Value: 09/14/2014 00:16     Performed at [facility]     Final   Value: NO GROWTH     Performed at [facility]   Culture     Final   Value: NO GROWTH     Performed at [facility]   Report Status 09/15/2014 FINAL   Final  MRSA PCR SCREENING     Status: None   Collection Time    09/13/14  9:55 PM  Result Value Ref Range Status   MRSA by PCR NEGATIVE  NEGATIVE  Final   Comment:            The GeneXpert MRSA Assay (FDA     approved for NASAL specimens     only), is one component of a     comprehensive MRSA colonization     surveillance program. It is not     intended to diagnose MRSA     infection nor to guide or     monitor treatment for     MRSA infections.  CLOSTRIDIUM DIFFICILE BY PCR     Status: None   Collection Time    09/20/14  7:59 PM      Result Value Ref Range Status   C difficile by pcr NEGATIVE  NEGATIVE Final   Comment: Performed at [facility]     Scheduled Meds: . aspirin EC  81 mg Oral Q breakfast  . carvedilol  6.25 mg Oral BID WC  . escitalopram  20 mg Oral Daily  . feeding supplement (PRO-STAT SUGAR FREE 64)  30 mL Oral BID WC  . furosemide  40 mg Oral Daily  . gabapentin  100 mg Oral BID  . heparin  5,000 Units Subcutaneous 3 times per day  . hydrocortisone sod succinate (SOLU-CORTEF) inj  50 mg Intravenous Q12H  . insulin aspart  0-20 Units Subcutaneous TID WC  . insulin aspart  0-5 Units Subcutaneous QHS  . insulin glargine  10 Units Subcutaneous QHS  . levothyroxine  175 mcg Oral QAC breakfast  . lisinopril  20 mg Oral Daily  . potassium chloride  20 mEq Oral Daily  . rOPINIRole  1 mg Oral BID  . senna-docusate  2 tablet Oral BID  . spironolactone  25 mg Oral Daily   Continuous Infusions:    Sayde Lish, DO  [facility] Pager [phone]  If 7PM-7AM, please contact night-coverage [email or web address] Password [email or web address] 09/23/2014, 7:59 AM   LOS: 10 days

## 2014-09-23 NOTE — Progress Notes (Signed)
Inpatient Diabetes Program Recommendations  AACE/ADA: New Consensus Statement on Inpatient Glycemic Control (2013)  Target Ranges:  Prepandial:   less than 140 mg/dL      Peak postprandial:   less than 180 mg/dL (1-2 hours)      Critically ill patients:  140 - 180 mg/dL   Reason for Visit: Hyperglycemia  Results for MEHLANI, BLANKEN[place]G (MRN [id number]) as of 09/23/2014 12:22  Ref. Range 09/22/2014 12:23 09/22/2014 17:07 09/22/2014 22:13 09/23/2014 07:30 09/23/2014 12:00  Glucose-Capillary Latest Range: 70-99 mg/dL 209 (H) 217 (H) 254 (H) 186 (H) 208 (H)   Post-prandial blood sugars remain elevated. Lantus 10 units QHS started last night.  Recommendations: Consider addition of Novolog 4 units tidwc for meal coverage insulin.   Will continue to follow. Thank you.   Lorenda Peck, RD, LDN, CDE Inpatient Diabetes Coordinator [phone]

## 2014-09-24 LAB — BASIC METABOLIC PANEL
ANION GAP: 9 (ref 5–15)
BUN: 18 mg/dL (ref 6–23)
CALCIUM: 8.2 mg/dL — AB (ref 8.4–10.5)
CO2: 40 mEq/L (ref 19–32)
CREATININE: 0.86 mg/dL (ref 0.50–1.10)
Chloride: 94 mEq/L — ABNORMAL LOW (ref 96–112)
GFR calc Af Amer: 83 mL/min — ABNORMAL LOW (ref >90)
GFR calc non Af Amer: 71 mL/min — ABNORMAL LOW (ref >90)
Glucose, Bld: 189 mg/dL — ABNORMAL HIGH (ref 70–99)
Potassium: 2.8 mEq/L — CL (ref 3.7–5.3)
SODIUM: 143 meq/L (ref 137–147)

## 2014-09-24 LAB — GLUCOSE, CAPILLARY
Glucose-Capillary: 220 mg/dL — ABNORMAL HIGH (ref 70–99)
Glucose-Capillary: 172 mg/dL — ABNORMAL HIGH (ref 70–99)

## 2014-09-24 MED ORDER — OXYCODONE-ACETAMINOPHEN 5-325 MG PO TABS: 1.0000 | ORAL_TABLET | ORAL | Status: DC | PRN

## 2014-09-24 MED ORDER — FUROSEMIDE 40 MG PO TABS: 40.0000 mg | ORAL_TABLET | Freq: Every day | ORAL | Status: AC

## 2014-09-24 MED ORDER — URELLE 81 MG PO TABS: 1.0000 | ORAL_TABLET | Freq: Four times a day (QID) | ORAL | Status: DC | PRN

## 2014-09-24 MED ORDER — LISINOPRIL 20 MG PO TABS: 20.0000 mg | ORAL_TABLET | Freq: Every day | ORAL | Status: DC

## 2014-09-24 MED ORDER — POTASSIUM CHLORIDE 10 MEQ/100ML IV SOLN
10.0000 meq | INTRAVENOUS | Status: AC
  Administered 2014-09-24 (×4): 10 meq via INTRAVENOUS
  Filled 2014-09-24 (×4): qty 100

## 2014-09-24 MED ORDER — POTASSIUM CHLORIDE CRYS ER 20 MEQ PO TBCR: 20.0000 meq | EXTENDED_RELEASE_TABLET | Freq: Every day | ORAL | Status: DC

## 2014-09-24 MED ORDER — PRO-STAT SUGAR FREE PO LIQD: 30.0000 mL | Freq: Two times a day (BID) | ORAL | Status: DC

## 2014-09-24 MED ORDER — TRAMADOL HCL 50 MG PO TABS
50.0000 mg | ORAL_TABLET | Freq: Once | ORAL | Status: AC
  Administered 2014-09-24: 50 mg via ORAL

## 2014-09-24 MED ORDER — SPIRONOLACTONE 25 MG PO TABS: 25.0000 mg | ORAL_TABLET | Freq: Every day | ORAL | Status: DC

## 2014-09-24 MED ORDER — SENNOSIDES-DOCUSATE SODIUM 8.6-50 MG PO TABS: 1.0000 | ORAL_TABLET | Freq: Two times a day (BID) | ORAL | Status: DC

## 2014-09-24 MED ORDER — CARVEDILOL 6.25 MG PO TABS: 6.2500 mg | ORAL_TABLET | Freq: Two times a day (BID) | ORAL | Status: DC

## 2014-09-24 MED ORDER — LINAGLIPTIN 5 MG PO TABS: 5.0000 mg | ORAL_TABLET | Freq: Every day | ORAL | Status: DC

## 2014-09-24 NOTE — Progress Notes (Signed)
Notified [facility] about potassium of 2.8. Replacement ordered. Will continue to monitor.

## 2014-09-24 NOTE — Progress Notes (Signed)
Clinical Social Work Department CLINICAL SOCIAL WORK PLACEMENT NOTE 09/24/2014  Patient:  Kathy Simpson, Kathy Simpson  Account Number:  [id number] Admit date:  09/13/2014  Clinical Social Worker:  Werner Lean, LCSW  Date/time:  09/24/2014 04:36 PM  Clinical Social Work is seeking post-discharge placement for this patient at the following level of care:   SKILLED NURSING   (*CSW will update this form in Epic as items are completed)   09/20/2014  Patient/family provided with [place] Department of Clinical Social Work's list of facilities offering this level of care within the geographic area requested by the patient (or if unable, by the patient's family).  09/20/2014  Patient/family informed of their freedom to choose among providers that offer the needed level of care, that participate in Medicare, Medicaid or managed care program needed by the patient, have an available bed and are willing to accept the patient.  09/20/2014  Patient/family informed of [facility]' ownership interest in [facility], as well as of the fact that they are under no obligation to receive care at this facility.  PASARR submitted to EDS on  PASARR number received on   FL2 transmitted to all facilities in geographic area requested by pt/family on  09/20/2014 FL2 transmitted to all facilities within larger geographic area on   Patient informed that his/her managed care company has contracts with or will negotiate with  certain facilities, including the following:     Patient/family informed of bed offers received:  09/21/2014 Patient chooses bed at [facility] Physician recommends and patient chooses bed at    Patient to be transferred to [facility] on  09/24/2014 Patient to be transferred to facility by [facility] Patient and family notified of transfer on 09/24/2014 Name of family member notified:  DAUGHTER  The following physician request were entered in  Epic:   Additional Comments: Pt / family are in agreement with d/c to SNF today. [facility] transport required. NSG reviewed d/c summary, scripts, avs. Scripts included in d/c packet. [facility] informed that this is a bariatric pt needing oxygen for transport.  Werner Lean LCSW [phone]

## 2014-09-24 NOTE — Discharge Summary (Addendum)
Physician Discharge Summary  Kathy Simpson DXI:[id number] DOB: 31-Jan-1951 DOA: 09/13/2014  PCP: [name], MD  Admit date: 09/13/2014 Discharge date: 09/24/2014  Recommendations for Outpatient Follow-up:  1. Pt will need to follow up with PCP in 3-4 weeks post discharge 2. Please obtain BMP and CBC on 09/28/14 3. Discontinue foley catheter for voiding trial on 09/26/14 4. Maintain oxygen 2L nasal canula and wean for saturation >92% 5. Follow-up with orthopedic surgery, Dr. [name] in 1-2 weeks   Discharge Diagnoses:  Acute systolic congestive heart failure  -Upon admission patient was noted to be in septic shock with AKI, and was given several liters of fluid  -Patient had no history of CHF  -Patient began to experience dyspnea  -Upon admission BNP was elevated, 1649  -Echocardiogram obtained: EF 30-35%  -started on lasix 40mg  IV BID  -09/23/2014--Lasix changed to 40 mg po daily and remained stable  -Cardiology consulted and appreciated and agreed with lasix, increased Coreg to 6.25mg  BID  -09/23/2014 lisinopril increased to 20mg   -09/22/2014 spironolactone added 25mg  daily  -Monitor daily weights, intake/output  Dyspnea  -multifactorial secondary to OHS/OSA, CHF  -May also be complicated by body habitus  -Encourage incentive spirometry and sitting up as much as possible  -Patient presently stable on 2 L nasal cannula -wean oxygen for saturation >92%  Diabetes Mellitus  -check A1c--6.3 -add lantus 10units--continue same dose as steroids are being decreased presently.  -will not restart actos at d/c due to CHF  -CBGs elevated partly due to steroids  -wean solucortef  -pt will restart home doses of her steroids on 09/25/14 -due to symptomatic CHF, will not restart pioglitazone  -start Tradjenta 5 mg daily after d/c -Follow up with her endocrinologist, Dr. Buddy Duty in 3-4 weeks Septic shock  -Resolved  -Thought to be secondary to urinary tract infection   -There is a question of whether blood pressure readings are accurate, patient did have A-line inserted, however removed on 10/23  -Patient completed 7 days of vancomycin and Zosyn, antibiotics discontinued on 10/25-->remained afebrile and hemodynamically stable Urinary tract infection  -Urine culture shows no growth  -Patient has no leukocytosis and is currently afebrile  -Completed antibiotic course  Acute renal failure  -Appears to have resolved with IV fluid hydration  -Likely secondary to sepsis including hypotension versus prerenal causes  -Creatinine was 5.7 at time of admission, currently 0.86 on day of d/c  -IV fluids discontinued 09/19/2014  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 [name] Physical deconditioning with frequent falls  -PT and OT consulted and recommended SNF  -SW consulted for placement  -followup with [name] in 1-2 weeks  Morbid obesity  -addressed by her primary care physician  Chronic anemia  -Hemoglobin appears to be at baseline  -will continue to monitor CBC  -Patient currently hemodynamically stable  Sacral decub, stage II/ hip wound  -Wound care consulted  -use the conservative xeroform gauze topped by ABDs and secured with tape and changed daily to the right hip. The bariatric therapeutic sleep surface with low air loss feature should remain in place.  -InterDry Ag+ for intertriginous dermatitis and continue the soft silicone foam dressing for  the sacral area.    Discharge Condition: stable  Disposition:  Follow-up Information   Follow up with [name], [name], D, MD In 1 week.   Specialty:   Orthopedic Surgery   Contact information:   [address] [place] [address] [phone]       Follow up with [name], MD In 1 month.   Specialty:  Cardiology   Contact information:   [address] [place] [address] [phone]       Follow up with [name], MD In 1 month.   Specialty:  Internal Medicine   Contact information:   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 and morbid obesity brought to ED by EMS with one week hx of malaise, anorexia, poor PO intake and suffered a fall with c/o L ankle pain. Was hypotensive in ED and received several liters NS with persistent low BP. UA revealed 11-20 WBC/HPF raising concern for UTI. PCCM asked to admit for sepsis.  Given 7days of broad spectrum antibiotics. Also had left ankle fracture. Patient was moved to medical floor. Became SOB. Upon admission BNP elevated. Started patient on lasix. Obtained echo which showed EF 30-35%. Cardiology consulted.  Given the patient's comorbidities and body habitus, cardiology opted for medical treatment at this time. The patient will need follow-up in 1 month's time. She will have a repeat echocardiogram in approximately 3 months. There is no improvement, the patient may need heart catheterization at that time. The patient's cardiac status was optimized with carvedilol, furosemide, spironolactone.     Consultants: Ortho--Dr. [name] Cardiology--Dr. [name] CCM-[name] Discharge Exam: Filed Vitals:   09/24/14 1000  BP: 154/128  Pulse: 75  Temp:   Resp:    Filed Vitals:   09/24/14 0437 09/24/14 0634 09/24/14 0742 09/24/14 1000  BP: 142/62   158/63 154/128  Pulse: 72  75 75  Temp: 98.6 F (37 C)     TempSrc: Oral     Resp: 19     Height:      Weight:  226 kg (498 lb 3.8 oz)    SpO2: 98%      General: A&O x 3, NAD, pleasant, cooperative Cardiovascular: RRR, no rub, no gallop, no S3 Respiratory: CTAB anterior, no wheeze, no rhonchi Abdomen:soft, nontender, nondistended, positive bowel sounds Extremities: 2+LE edema, No lymphangitis, no petechiae;  R-hip with mild erythema, no draining wound or necrosis, no crepitance  Discharge Instructions      Discharge Instructions   Diet - low sodium heart healthy    Complete by:  As directed      Increase activity slowly    Complete by:  As directed             Medication List    STOP taking these medications       atenolol 100 MG tablet  Commonly known as:  TENORMIN     hydrochlorothiazide 50 MG tablet  Commonly known as:  HYDRODIURIL     Influenza Vac Split Quad 0.25 ML injection  Commonly known as:  FLUZONE     nabumetone 500 MG tablet  Commonly known as:  RELAFEN  OVER THE COUNTER MEDICATION     pioglitazone 45 MG tablet  Commonly known as:  ACTOS     quinapril 40 MG tablet  Commonly known as:  ACCUPRIL     traMADol 50 MG tablet  Commonly known as:  ULTRAM     TRIPLE ANTIBIOTIC [phone] Oint      TAKE these medications       aspirin EC 81 MG tablet  Take 81 mg by mouth daily with breakfast.     carvedilol 6.25 MG tablet  Commonly known as:  COREG  Take 1 tablet (6.25 mg total) by mouth 2 (two) times daily with a meal.     DESITIN 40 % ointment  Generic drug:  liver oil-zinc oxide  Apply 1 application topically as needed for irritation.     escitalopram 20 MG tablet  Commonly known as:  LEXAPRO  Take 20 mg by mouth daily.     feeding supplement (PRO-STAT SUGAR FREE 64) Liqd  Take 30 mLs by mouth 2 (two) times daily with a meal.     furosemide 40 MG tablet  Commonly known as:  LASIX  Take 1 tablet (40 mg total) by mouth daily.      gabapentin 100 MG capsule  Commonly known as:  NEURONTIN  Take 100 mg by mouth 2 (two) times daily.     hydrocortisone 10 MG tablet  Commonly known as:  CORTEF  Take 15 mg by mouth daily with breakfast.     hydrocortisone 20 MG tablet  Commonly known as:  CORTEF  Take 20 mg by mouth every evening.     levothyroxine 175 MCG tablet  Commonly known as:  SYNTHROID, LEVOTHROID  Take 175 mcg by mouth daily before breakfast.     linagliptin 5 MG Tabs tablet  Commonly known as:  TRADJENTA  Take 1 tablet (5 mg total) by mouth daily.     lisinopril 20 MG tablet  Commonly known as:  PRINIVIL,ZESTRIL  Take 1 tablet (20 mg total) by mouth daily.     oxyCODONE-acetaminophen 5-325 MG per tablet  Commonly known as:  PERCOCET/ROXICET  Take 1 tablet by mouth every 4 (four) hours as needed for severe pain.     potassium chloride SA 20 MEQ tablet  Commonly known as:  K-DUR,KLOR-CON  Take 1 tablet (20 mEq total) by mouth daily.     RISPERDAL 1 MG tablet  Generic drug:  risperiDONE  Take 1 mg by mouth at bedtime.     rOPINIRole 1 MG tablet  Commonly known as:  REQUIP  Take 1 mg by mouth 2 (two) times daily.     senna-docusate 8.6-50 MG per tablet  Commonly known as:  Senokot-S  Take 1 tablet by mouth 2 (two) times daily.     spironolactone 25 MG tablet  Commonly known as:  ALDACTONE  Take 1 tablet (25 mg total) by mouth daily.     TYLENOL 500 MG tablet  Generic drug:  acetaminophen  Take 1,500 mg by mouth every 6 (six) hours as needed for mild pain or headache.     URELLE 81 MG Tabs tablet  Take 1 tablet (81 mg total) by mouth every 6 (six) hours as needed for bladder spasms. Stop after 09/26/14 doses         The results of significant diagnostics from this hospitalization (including imaging, microbiology, ancillary and laboratory) are listed below for reference.    Significant Diagnostic Studies: Dg Chest Port 1 View  09/14/2014  CLINICAL DATA:  Respiratory failure.   Subsequent encounter.  EXAM: PORTABLE CHEST - 1 VIEW  COMPARISON:  09/13/2014; 01/26/2011; 01/24/2011  FINDINGS: Examination is degraded due to portable technique and patient body habitus.  Grossly unchanged enlarged cardiac silhouette and mediastinal contours. Pulmonary vasculature appears less distinct with cephalization flow. Slight worsening of bibasilar heterogeneous opacities, right greater than left. No definite pleural effusion or pneumothorax. Unchanged bones.  IMPRESSION: Slight worsening of pulmonary edema and bibasilar opacities, right greater than left, atelectasis versus infiltrate. Further evaluation with a PA and lateral chest radiograph may be obtained as clinically indicated.   Electronically Signed   By: Sandi Mariscal M.D.   On: 09/14/2014 07:46   Dg Chest Port 1 View  09/13/2014   CLINICAL DATA:  [place] today; SOB today; pt states that she has been sick for about couple weeks, and today she fell onto her left ankle while she was trying to move; pain in left ankle, pt states that she had previous injury to the left ankle; no N/V, just felt sick; hx HTN; diabetic  EXAM: PORTABLE CHEST - 1 VIEW  COMPARISON:  01/26/2011  FINDINGS: Mild bilateral interstitial thickening. No pleural effusion or pneumothorax. Stable cardiomegaly. Unremarkable osseous structures.  IMPRESSION: Findings concerning for mild CHF.   Electronically Signed   By: Kathreen Devoid   On: 09/13/2014 17:24   Dg Ankle Left Port  09/13/2014   CLINICAL DATA:  63 year old female status post fall at home while trying to transfer into a chair. Initial encounter.  EXAM: PORTABLE LEFT ANKLE - 2 VIEW  COMPARISON:  None.  FINDINGS: Large body habitus and osteopenia. Calcaneus intact. Mortise joint alignment within normal limits and talar dome intact.  There is a corticated chronic appearing osseous fragment at the medial malleolus. However, there is a superimposed lucent acute appearing fracture through the more proximal right tibia meta  diaphysis (arrow on image 1). Minimal displacement. The posterior malleolus appears intact. There appears to be a chronic deformity of the lateral malleolus, no other acute fracture identified.  IMPRESSION: 1. Acute on chronic right tibia medial malleolus fracture, minimally displaced. 2. No other acute fracture or dislocation identified about the left ankle.   Electronically Signed   By: Lars Pinks M.D.   On: 09/13/2014 17:26     Microbiology: Recent Results (from the past 240 hour(s))  CLOSTRIDIUM DIFFICILE BY PCR     Status: None   Collection Time    09/20/14  7:59 PM      Result Value Ref Range Status   C difficile by pcr NEGATIVE  NEGATIVE Final   Comment: Performed at [facility]: Basic Metabolic Panel:  Recent Labs Lab 09/18/14 0432 09/19/14 0500 09/20/14 0510 09/22/14 0430 09/23/14 0440 09/24/14 0420  NA 140 141 142 143 146 143  K 3.3* 3.8 3.8 3.4* 3.9 2.8*  CL 104 103 104 100 100 94*  CO2 25 26 27  34* 36* 40*  GLUCOSE 187* 177* 178* 211* 215* 189*  BUN 24* 19 18 20 21 18   CREATININE 1.02 0.96 0.91 0.93 0.86 0.86  CALCIUM 8.3* 8.4 8.5 8.2* 8.3* 8.2*  MG 2.2  --   --   --   --   --   PHOS 2.5  --   --   --   --   --    Liver Function Tests: No results found for this basename: AST, ALT, ALKPHOS, BILITOT, PROT, ALBUMIN,  in the last 168  hours No results found for this basename: LIPASE, AMYLASE,  in the last 168 hours No results found for this basename: AMMONIA,  in the last 168 hours CBC:  Recent Labs Lab 09/18/14 0432 09/19/14 0819 09/20/14 0510 09/22/14 0430  WBC 9.9 9.9 10.8* 8.7  NEUTROABS 8.4*  --   --   --   HGB 7.8* 8.3* 8.7* 8.5*  HCT 24.5* 25.7* 27.2* 26.6*  MCV 90.4 92.1 91.3 93.0  PLT 289 275 300 226   Cardiac Enzymes: No results found for this basename: CKTOTAL, CKMB, CKMBINDEX, TROPONINI,  in the last 168 hours BNP: No components found with this basename: POCBNP,  CBG:  Recent Labs Lab 09/23/14 1200 09/23/14 1631  09/23/14 2153 09/24/14 0736 09/24/14 1114  GLUCAP 208* 174* 245* 220* 172*    Time coordinating discharge:  Greater than 30 minutes  Signed:  Varshini Arrants, DO [facility] Pager: [phone] 09/24/2014, 12:46 PM

## 2014-09-24 NOTE — Progress Notes (Signed)
Notified [facility] about pt CO2 of 40. No new orders given at this time. They will review pt's chart and will notify RN if new orders are given. Will continue to monitor

## 2014-10-28 ENCOUNTER — Ambulatory Visit: Payer: 59 | Admitting: Internal Medicine

## 2014-10-28 LAB — TROPONIN I: TROPONIN-I: 0.25 ng/mL — AB

## 2014-10-28 LAB — COMPREHENSIVE METABOLIC PANEL
ALT: 36 U/L
AST: 47 U/L — AB (ref 15–37)
Albumin: 2.9 g/dL — ABNORMAL LOW (ref 3.4–5.0)
Alkaline Phosphatase: 62 U/L
Anion Gap: 12 (ref 7–16)
BUN: 28 mg/dL — AB (ref 7–18)
Bilirubin,Total: 1.6 mg/dL — ABNORMAL HIGH (ref 0.2–1.0)
CO2: 22 mmol/L (ref 21–32)
CREATININE: 2.88 mg/dL — AB (ref 0.60–1.30)
Calcium, Total: 8.7 mg/dL (ref 8.5–10.1)
Chloride: 95 mmol/L — ABNORMAL LOW (ref 98–107)
GFR CALC AF AMER: 21 — AB
GFR CALC NON AF AMER: 18 — AB
Glucose: 332 mg/dL — ABNORMAL HIGH (ref 65–99)
Osmolality: 277 (ref 275–301)
Potassium: 5 mmol/L (ref 3.5–5.1)
Sodium: 129 mmol/L — ABNORMAL LOW (ref 136–145)
TOTAL PROTEIN: 6.9 g/dL (ref 6.4–8.2)

## 2014-10-28 LAB — CBC
HCT: 34.9 % — AB (ref 35.0–47.0)
HGB: 11.3 g/dL — ABNORMAL LOW (ref 12.0–16.0)
MCH: 29.7 pg (ref 26.0–34.0)
MCHC: 32.3 g/dL (ref 32.0–36.0)
MCV: 92 fL (ref 80–100)
PLATELETS: 179 10*3/uL (ref 150–440)
RBC: 3.8 10*6/uL (ref 3.80–5.20)
RDW: 20.1 % — ABNORMAL HIGH (ref 11.5–14.5)
WBC: 13 10*3/uL — ABNORMAL HIGH (ref 3.6–11.0)

## 2014-10-28 LAB — LIPASE, BLOOD: LIPASE: 99 U/L (ref 73–393)

## 2014-10-29 ENCOUNTER — Inpatient Hospital Stay: Payer: Self-pay | Admitting: Internal Medicine

## 2014-10-29 DIAGNOSIS — I959 Hypotension, unspecified: Secondary | ICD-10-CM

## 2014-10-29 DIAGNOSIS — R6521 Severe sepsis with septic shock: Secondary | ICD-10-CM

## 2014-10-29 DIAGNOSIS — I340 Nonrheumatic mitral (valve) insufficiency: Secondary | ICD-10-CM

## 2014-10-29 DIAGNOSIS — I214 Non-ST elevation (NSTEMI) myocardial infarction: Secondary | ICD-10-CM

## 2014-10-29 DIAGNOSIS — I5022 Chronic systolic (congestive) heart failure: Secondary | ICD-10-CM

## 2014-10-29 LAB — APTT: Activated PTT: 29.3 secs (ref 23.6–35.9)

## 2014-10-29 LAB — URINALYSIS, COMPLETE
Bacteria: NONE SEEN
Bilirubin,UR: NEGATIVE
Glucose,UR: >=500 mg/dL (ref 0–75)
Ketone: NEGATIVE
NITRITE: NEGATIVE
PH: 8 (ref 4.5–8.0)
Protein: 30
RBC,UR: 5 /HPF (ref 0–5)
SPECIFIC GRAVITY: 1.002 (ref 1.003–1.030)
Squamous Epithelial: 1
WBC UR: 54 /HPF (ref 0–5)

## 2014-10-29 LAB — MAGNESIUM: Magnesium: 1.7 mg/dL — ABNORMAL LOW

## 2014-10-29 LAB — PROTIME-INR
INR: 1.2
Prothrombin Time: 14.8 secs — ABNORMAL HIGH (ref 11.5–14.7)

## 2014-10-29 LAB — BASIC METABOLIC PANEL
Anion Gap: 11 (ref 7–16)
BUN: 30 mg/dL — ABNORMAL HIGH (ref 7–18)
Calcium, Total: 8.4 mg/dL — ABNORMAL LOW (ref 8.5–10.1)
Chloride: 103 mmol/L (ref 98–107)
Co2: 25 mmol/L (ref 21–32)
Creatinine: 2.76 mg/dL — ABNORMAL HIGH (ref 0.60–1.30)
EGFR (African American): 22 — ABNORMAL LOW
GFR CALC NON AF AMER: 19 — AB
Glucose: 540 mg/dL (ref 65–99)
Osmolality: 308 (ref 275–301)
POTASSIUM: 3.7 mmol/L (ref 3.5–5.1)
SODIUM: 139 mmol/L (ref 136–145)

## 2014-10-29 LAB — CK TOTAL AND CKMB (NOT AT ARMC)
CK, TOTAL: 400 U/L — AB (ref 26–192)
CK, Total: 289 U/L — ABNORMAL HIGH (ref 26–192)
CK-MB: 2 ng/mL (ref 0.5–3.6)
CK-MB: 3.4 ng/mL (ref 0.5–3.6)

## 2014-10-29 LAB — CK-MB: CK-MB: 4 ng/mL — ABNORMAL HIGH (ref 0.5–3.6)

## 2014-10-29 LAB — PHOSPHORUS: Phosphorus: 4.6 mg/dL (ref 2.5–4.9)

## 2014-10-29 LAB — HEMOGLOBIN A1C: Hemoglobin A1C: 8.4 % — ABNORMAL HIGH (ref 4.2–6.3)

## 2014-10-29 LAB — HEPARIN LEVEL (UNFRACTIONATED): Anti-Xa(Unfractionated): 0.32 IU/mL (ref 0.30–0.70)

## 2014-10-29 LAB — TROPONIN I
Troponin-I: 0.73 ng/mL — ABNORMAL HIGH
Troponin-I: 2.1 ng/mL — ABNORMAL HIGH

## 2014-10-29 LAB — TSH: Thyroid Stimulating Horm: <0.01 u[IU]/mL — ABNORMAL LOW

## 2014-10-30 LAB — CBC WITH DIFFERENTIAL/PLATELET
BASOS PCT: 0.1 %
Basophil #: 0 10*3/uL (ref 0.0–0.1)
EOS ABS: 0.1 10*3/uL (ref 0.0–0.7)
Eosinophil %: 0.4 %
HCT: 32.2 % — AB (ref 35.0–47.0)
HGB: 10.5 g/dL — AB (ref 12.0–16.0)
LYMPHS ABS: 0.8 10*3/uL — AB (ref 1.0–3.6)
LYMPHS PCT: 5.7 %
MCH: 29.4 pg (ref 26.0–34.0)
MCHC: 32.5 g/dL (ref 32.0–36.0)
MCV: 91 fL (ref 80–100)
Monocyte #: 0.7 x10 3/mm (ref 0.2–0.9)
Monocyte %: 5.1 %
Neutrophil #: 12.3 10*3/uL — ABNORMAL HIGH (ref 1.4–6.5)
Neutrophil %: 88.7 %
Platelet: 182 10*3/uL (ref 150–440)
RBC: 3.55 10*6/uL — ABNORMAL LOW (ref 3.80–5.20)
RDW: 20 % — AB (ref 11.5–14.5)
WBC: 13.8 10*3/uL — AB (ref 3.6–11.0)

## 2014-10-30 LAB — COMPREHENSIVE METABOLIC PANEL
ALK PHOS: 58 U/L
ANION GAP: 11 (ref 7–16)
Albumin: 2.8 g/dL — ABNORMAL LOW (ref 3.4–5.0)
BUN: 28 mg/dL — AB (ref 7–18)
Bilirubin,Total: 0.7 mg/dL (ref 0.2–1.0)
CALCIUM: 8.5 mg/dL (ref 8.5–10.1)
CO2: 27 mmol/L (ref 21–32)
CREATININE: 1.8 mg/dL — AB (ref 0.60–1.30)
Chloride: 101 mmol/L (ref 98–107)
EGFR (African American): 37 — ABNORMAL LOW
GFR CALC NON AF AMER: 30 — AB
Glucose: 346 mg/dL — ABNORMAL HIGH (ref 65–99)
Osmolality: 297 (ref 275–301)
POTASSIUM: 3.5 mmol/L (ref 3.5–5.1)
SGOT(AST): 27 U/L (ref 15–37)
SGPT (ALT): 31 U/L
Sodium: 139 mmol/L (ref 136–145)
Total Protein: 7.3 g/dL (ref 6.4–8.2)

## 2014-10-30 LAB — MAGNESIUM: MAGNESIUM: 2.2 mg/dL

## 2014-10-30 LAB — URINE CULTURE

## 2014-10-30 LAB — HEPARIN LEVEL (UNFRACTIONATED): Anti-Xa(Unfractionated): 0.21 IU/mL — ABNORMAL LOW (ref 0.30–0.70)

## 2014-10-30 LAB — TROPONIN I: Troponin-I: 0.42 ng/mL — ABNORMAL HIGH

## 2014-10-31 LAB — CBC WITH DIFFERENTIAL/PLATELET
Basophil #: 0 10*3/uL (ref 0.0–0.1)
Basophil %: 0.1 %
EOS ABS: 0 10*3/uL (ref 0.0–0.7)
EOS PCT: 0 %
HCT: 24.6 % — ABNORMAL LOW (ref 35.0–47.0)
HGB: 8.1 g/dL — ABNORMAL LOW (ref 12.0–16.0)
LYMPHS ABS: 0.7 10*3/uL — AB (ref 1.0–3.6)
LYMPHS PCT: 10.4 %
MCH: 29.9 pg (ref 26.0–34.0)
MCHC: 32.9 g/dL (ref 32.0–36.0)
MCV: 91 fL (ref 80–100)
MONO ABS: 0.4 x10 3/mm (ref 0.2–0.9)
MONOS PCT: 5.7 %
Neutrophil #: 5.9 10*3/uL (ref 1.4–6.5)
Neutrophil %: 83.8 %
Platelet: 128 10*3/uL — ABNORMAL LOW (ref 150–440)
RBC: 2.71 10*6/uL — AB (ref 3.80–5.20)
RDW: 19.9 % — ABNORMAL HIGH (ref 11.5–14.5)
WBC: 7 10*3/uL (ref 3.6–11.0)

## 2014-10-31 LAB — BASIC METABOLIC PANEL
Anion Gap: 5 — ABNORMAL LOW (ref 7–16)
BUN: 32 mg/dL — ABNORMAL HIGH (ref 7–18)
CALCIUM: 7.9 mg/dL — AB (ref 8.5–10.1)
CHLORIDE: 100 mmol/L (ref 98–107)
CO2: 28 mmol/L (ref 21–32)
Creatinine: 1.34 mg/dL — ABNORMAL HIGH (ref 0.60–1.30)
EGFR (Non-African Amer.): 43 — ABNORMAL LOW
GFR CALC AF AMER: 52 — AB
Glucose: 219 mg/dL — ABNORMAL HIGH (ref 65–99)
Osmolality: 280 (ref 275–301)
Potassium: 3.3 mmol/L — ABNORMAL LOW (ref 3.5–5.1)
SODIUM: 133 mmol/L — AB (ref 136–145)

## 2014-10-31 LAB — MAGNESIUM: MAGNESIUM: 1.9 mg/dL

## 2014-11-01 LAB — BASIC METABOLIC PANEL
ANION GAP: 9 (ref 7–16)
BUN: 20 mg/dL — AB (ref 7–18)
CALCIUM: 8.3 mg/dL — AB (ref 8.5–10.1)
CHLORIDE: 96 mmol/L — AB (ref 98–107)
Co2: 30 mmol/L (ref 21–32)
Creatinine: 0.89 mg/dL (ref 0.60–1.30)
EGFR (African American): >60
EGFR (Non-African Amer.): >60
Glucose: 186 mg/dL — ABNORMAL HIGH (ref 65–99)
Osmolality: 278 (ref 275–301)
POTASSIUM: 3.7 mmol/L (ref 3.5–5.1)
SODIUM: 135 mmol/L — AB (ref 136–145)

## 2014-11-01 LAB — CBC WITH DIFFERENTIAL/PLATELET
BASOS ABS: 0 10*3/uL (ref 0.0–0.1)
Basophil %: 0.1 %
EOS PCT: 0.9 %
Eosinophil #: 0 10*3/uL (ref 0.0–0.7)
HCT: 25.7 % — ABNORMAL LOW (ref 35.0–47.0)
HGB: 8.5 g/dL — ABNORMAL LOW (ref 12.0–16.0)
Lymphocyte #: 0.9 10*3/uL — ABNORMAL LOW (ref 1.0–3.6)
Lymphocyte %: 24.5 %
MCH: 29.9 pg (ref 26.0–34.0)
MCHC: 33.1 g/dL (ref 32.0–36.0)
MCV: 90 fL (ref 80–100)
MONOS PCT: 9.5 %
Monocyte #: 0.3 x10 3/mm (ref 0.2–0.9)
NEUTROS PCT: 65 %
Neutrophil #: 2.3 10*3/uL (ref 1.4–6.5)
Platelet: 132 10*3/uL — ABNORMAL LOW (ref 150–440)
RBC: 2.85 10*6/uL — ABNORMAL LOW (ref 3.80–5.20)
RDW: 20 % — ABNORMAL HIGH (ref 11.5–14.5)
WBC: 3.5 10*3/uL — AB (ref 3.6–11.0)

## 2014-11-02 LAB — CULTURE, BLOOD (SINGLE)

## 2015-01-25 DIAGNOSIS — E669 Obesity, unspecified: Secondary | ICD-10-CM | POA: Diagnosis not present

## 2015-01-25 DIAGNOSIS — M5137 Other intervertebral disc degeneration, lumbosacral region: Secondary | ICD-10-CM | POA: Diagnosis not present

## 2015-01-25 DIAGNOSIS — M179 Osteoarthritis of knee, unspecified: Secondary | ICD-10-CM | POA: Diagnosis not present

## 2015-01-25 DIAGNOSIS — E1040 Type 1 diabetes mellitus with diabetic neuropathy, unspecified: Secondary | ICD-10-CM | POA: Diagnosis not present

## 2015-03-08 DIAGNOSIS — E236 Other disorders of pituitary gland: Secondary | ICD-10-CM | POA: Diagnosis not present

## 2015-03-08 DIAGNOSIS — R6889 Other general symptoms and signs: Secondary | ICD-10-CM | POA: Diagnosis not present

## 2015-03-08 DIAGNOSIS — I10 Essential (primary) hypertension: Secondary | ICD-10-CM | POA: Diagnosis not present

## 2015-03-08 DIAGNOSIS — E0965 Drug or chemical induced diabetes mellitus with hyperglycemia: Secondary | ICD-10-CM | POA: Diagnosis not present

## 2015-03-08 DIAGNOSIS — Z6841 Body Mass Index (BMI) 40.0 and over, adult: Secondary | ICD-10-CM | POA: Diagnosis not present

## 2015-03-09 DIAGNOSIS — E0965 Drug or chemical induced diabetes mellitus with hyperglycemia: Secondary | ICD-10-CM | POA: Diagnosis not present

## 2015-03-20 NOTE — Discharge Summary (Signed)
PATIENT NAME:  JERAE, IZARD MR#:  [id number] DATE OF BIRTH:  [birth year]  DATE OF ADMISSION:  10/29/2014 DATE OF DISCHARGE: 11/02/2014  ADMITTING DIAGNOSES: Fever, hypotension.   DISCHARGE DIAGNOSES:  1. Fever, hypotension, due to sepsis as a result of likely a urinary tract infection, but also source could have been a right thigh cellulitis. Now the patient is doing much better, afebrile. Blood cultures negative.  2. Sepsis with hypotension requiring pressors briefly. Now blood pressure is stable.  3. Acute kidney injury felt to be likely acute tubular necrosis related to hypotension, now renal function is normal.  4. Possible non-ST-myocardial infarction, felt to be due to demand ischemia and sepsis by cardiology, who recommended medical management.  5. Poorly controlled diabetes.  6. Hypopituitarism, received a brief dose of stress dose steroids.  7. Obstructive sleep apnea.  8. Anemia.  9. Recent left ankle fracture, partial weightbearing and follow up with orthopedics as previously recommended.  10. History of a sacral decubitus ulcer.  11. Hypothyroidism.   CONSULTANTS: Dr. Rockey Situ, Dr. Fletcher Anon.   LABORATORY DATA AND DIAGNOSTIC DATA: Echocardiogram of the heart showed EF 60% to 65%, poor quality due to body habitus, mild mitral valve regurgitation, mild tricuspid regurgitation.   Admitting glucose 332, BUN 28, creatinine 2.88, sodium 129, potassium 5.0, chloride 95, CO2 of 22, calcium was 8.7. Hemoglobin A1c 8.4. LFTs were normal, except albumin of 2.9, and bilirubin total 1.6, AST 47.   Troponin was 0.25, 0.73 and 2.10.   WBC 13, hemoglobin 11.3, platelet count was 179,000.   Urinalysis, 2+ leukocytes, WBCs 54.   Chest x-ray, single view: Showed mild vascular congestion and subtle perihilar interstitial prominence suspicious of mild interstitial edema, mild basilar atelectasis.   HOSPITAL COURSE: Please refer to the history and physical done by the admitting physician. The  patient is a 64 year old, Caucasian female, with history of hypopituitarism, as well as hypothyroidism, status post pituitary tumor resection, sleep apnea, who had a left leg fracture about 6 weeks ago, was complicated by sepsis secondary to UTI, as well as CHF. She was treated at [facility] and was recently discharged to [facility]. The patient was improving; however, on the day of admission, the patient started having a fever of 103 and altered mental status, with lethargy and confusion. Due to these symptoms, she was admitted for sepsis syndrome, and was started on broad-spectrum antibiotics. This source was thought to be urinary; however, subsequent evaluation also showed that she had right thigh erythema and swelling. The patient was initially admitted to the ICU, placed on Levophed. She required a brief dose of Levophed and then she was weaned off of that. She was seen by infectious disease, who recommended to be on p.o. antibiotics. Over the past 2 days, she has not had any fevers. She is doing much better. She also was noted to have an elevated troponin and was thought to have a non-ST-MI. She was seen by cardiology. They felt that this was more demand ischemia. Echocardiogram showed a preserved EF.   At this time, she is stable and is stable to return back for rehabilitation for further evaluation and treatment.   DISCHARGE MEDICATIONS:  1. Benadryl 25 mg p.o. q.4 h. p.r.n. for itching.  2. Lantus 30 units once a day in the morning. 3. NovoLog FlexPen 10 units t.i.d.  4. Potassium chloride 20 mg 1 tablet p.o. b.i.d.  5. Zofran 4 mg daily.  6. Synthroid 175 mcg daily. 7. Hydrocortisone 15 mg once a day  in the morning, 20 mg once a day in the evening. 8. Gabapentin 100 one tablet p.o. b.i.d.  9. Spironolactone 25 p.o. daily. 10. Senna 1 tablet p.o. b.i.d. 11. Risperidone 1 mg at bedtime.  12. Lasix 40 daily.  13. Lexapro 20 daily.  14. Desitin 13% topical cream applied topically to  affected area p.r.n. 15. Linagliptin 5 mg daily. 16. Ropinirole 1 mg p.o. b.i.d. 17. Urelle 1 tablet 4 times a day.  18. Percocet 5/325 q.4 h. p.r.n. 19. Lantus 30 units at bedtime. 20. NovoLog sliding scale as previously.  21. Lovenox 40 mg subcutaneously q.24 h. 22. Augmentin 875 one tablet p.o. q.12 h. x 8 days.   DIET: Low sodium, low fat, low cholesterol, carbohydrate-controlled diet.   ACTIVITY: As tolerated with the left leg boot, partial weightbearing, and advance as per orthopedics.   FOLLOW-UP: With the skilled nursing facility in 1 to 2 weeks. Incentive spirometry q.1 h. while awake.   TIME SPENT ON THIS DISCHARGE: 40 minutes.     ____________________________ Lafonda Mosses Posey Pronto, MD shp:JT D: 11/02/2014 10:13:11 ET T: 11/02/2014 11:13:19 ET JOB#: [id number]  cc: Asyria Kolander H. Posey Pronto, MD, <Dictator> Alric Seton MD ELECTRONICALLY SIGNED 11/06/2014 17:11

## 2015-03-20 NOTE — H&P (Signed)
PATIENT NAME:  Kathy Simpson, Kathy Simpson MR#:  [id number] DATE OF BIRTH:  [birth year]  DATE OF ADMISSION:  10/29/2014  REFERRING PHYSICIAN:  Harvest Dark, MD   PRIMARY CARE PHYSICIAN:  Dr. Laurann Montana   CHIEF COMPLAINT:  Fever.   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 The patient had a left leg fracture about 6 weeks ago. Course was complicated by sepsis secondary to UTI as well as congestive heart failure. This was treated at [facility]. She was recently discharged to [facility] and actually improving per her family at bedside. Today, she went to the orthopedist and had the cast removed from her left leg and allowed to be weightbearing. She also received a cortisone injection in her left knee. Husband does note that she had one episode of nausea and vomiting yesterday but otherwise states that she was in her usual state of health. However, today at the nursing facility at [facility], the patient was known to be febrile at 103 degrees Fahrenheit with altered mental status described as lethargy and confusion and thus sent to the hospital for further workup and evaluation. The patient is unable to provide any meaningful information given mental status and medical condition.   REVIEW OF SYSTEMS:  Unobtainable given mental status and medical condition.   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   SOCIAL HISTORY:  No alcohol, tobacco, or drug usage. She uses a walker at baseline. Currently resides at [facility] facility.    FAMILY HISTORY:  Positive for multiple family members with congestive heart failure.   ALLERGIES:  No known drug allergies.   HOME MEDICATIONS:  Include hydrocortisone 20 mg p.o. daily, Lantus 30 units subcutaneously in the morning as well as at bedtime, NovoLog 10 units subcutaneously 3 times daily before meals as well as sliding scale coverage, Benadryl 25 mg p.o. q. 4 hours as needed for itching, Zofran 4 mg p.o. daily, and potassium 20 mEq p.o. b.i.d.   PHYSICAL EXAMINATION: VITAL SIGNS:  Temperature 103 degrees Fahrenheit, heart rate 141, respirations 26, blood pressure 70/33, and saturating 97% on 4 liters nasal cannula. Weight 196.4 kg.  GENERAL:  Obese, chronically ill-appearing Caucasian female, currently in moderate distress given mental status and blood pressure.  HEAD:  Normocephalic, atraumatic.  EYES:  Pupils are equal, round, and reactive to light. Unable to fully assess the extraocular muscles given the patient's mental status and medical condition, but there is no scleral icterus.  MOUTH:  Moist mucosal membranes. Dentition is intact. No abscess noted.  EARS, NOSE, AND THROAT:  Clear without exudates. No external lesions.  NECK:  Supple. No thyromegaly. No nodules. No JVD. Triple-lumen catheter placed in the right IJ.  PULMONARY:  Grossly diminished breath sounds throughout all lung fields secondary to poor respiratory effort. However, no frank wheezes, rales, or rhonchi.  CHEST:  Nontender to palpation.  CARDIOVASCULAR:  S1 and S2, tachycardic. No murmurs, rubs, or gallops. Trace pedal edema bilaterally. Pedal pulses are diminished given current blood pressure.  ABDOMEN:  Soft, obese, nontender, nondistended. No masses. No appreciable hepatosplenomegaly. Positive bowel sounds.  MUSCULOSKELETAL:  No swelling, clubbing, or edema. Range  of motion passively full in all extremities.  NEUROLOGICAL:  Unable to fully assess given the patient's mental status and medical condition. She  is unable to follow simple commands at this time. She does have spontaneous movement of all extremities.  SKIN:  Erythematous regions of pannus, most prominent on the right side, however, no actual rashes or lesions.  PSYCHIATRIC:  Unable to fully assess given the patient's mental status and medical condition. Once again, she is unable to follow simple commands at this time and unable to follow verbal commands. She does mumble incoherently.   LABORATORY AND RADIOGRAPHIC DATA:  Chest x-ray performed:  Mild vascular congestion with subtle perihilar interstitial prominence. This is suspicious for mild interstitial edema as well as mild basilar atelectasis.   Remainder of laboratory data:  Sodium 129, potassium 5, chloride 95, bicarbonate 22, BUN 28, creatinine 2.88, glucose 332. LFTs:  Albumin of 2.9, bilirubin 1.6, and AST of 47, otherwise within normal limits. Troponin I is 0.25. WBC is 13, hemoglobin 11.3, and platelets are 179,000. ABG performed:  pH 7.42, CO2 of 34, O2 of 99, bicarbonate 22.1, and lactic acid of 4.9, venous.   ASSESSMENT AND PLAN:  This is a 64 year old Caucasian female with a history of hypopituitarism and hypothyroidism, presenting with fever.  1.  Septic shock, meeting sepsis criteria by heart rate, respiratory rate, temperature as well as leukocytosis with septic shock criteria by end-organ failure including acute kidney injury, lactic acid, and she is on pressor therapy. Admit to the intensive care unit and trend CVP with goal of 8 to 11. IV fluid hydration as well as pressor therapy to maintain mean arterial pressure greater than 65. Pancultures including blood and urine; however, she is anuric at this time. Start antibiotic coverage with vancomycin and Zosyn, and we will need to follow up with the [facility] records to check their microbial sensitivities. We will repeat lactic acid 3 hours after the first lactic acid as well as initiate stress dose steroids given her hypopituitarism.   2.  Acute kidney injury. This is prerenal versus acute tubular necrosis at this point. We are going to see if she is fluid responsive and provide IV fluid hydration. We will follow urine output and renal function.  3.  Elevated troponin. This is in the setting of acute kidney injury. We will trend cardiac enzymes x 3; if upward trending, may require anticoagulation.  4.  Type 2 diabetes, insulin-requiring. Decrease the dose of Lantus to about half of it, which would be 15 units b.i.d. given her n.p.o. status. Add insulin sliding scale with q. 6 hour Accu-Cheks with goal blood glucose of 120 to 180 in critical care setting.  5.  Hypopituitarism. We will give stress dose steroids, a dose of 125 mg now of Solu-Medrol and continue with hydrocortisone 50 mg IV q. 6 hours.  6.  Obstructive sleep apnea on continuous positive airway pressure therapy at nighttime as long as she does not have further  respiratory issues; otherwise, she will require BiPAP.  7.  Venous thromboembolism prophylaxis with heparin subcutaneously.   CODE STATUS:  The patient is a full code.   CRITICAL CARE TIME SPENT:  55 minutes.     ____________________________ Aaron Mose. Dalton Molesworth, MD dkh:nb D: 10/29/2014 00:50:23 ET T: 10/29/2014 03:35:04 ET JOB#: [id number]  cc: Aaron Mose. Abimael Zeiter, MD, <Dictator> Starsky Nanna Woodfin Ganja MD ELECTRONICALLY SIGNED 10/29/2014 20:39

## 2015-03-20 NOTE — Consult Note (Signed)
Brief Consult Note: Diagnosis: septic shock, NSTEMI, chronic systolic heart failure.   Patient was seen by consultant.   Consult note dictated.   Comments: Elevated TnI could be due to supply demand ischemia. Continue Heparin for 48 hours. I added Aspirin.  Get echo.  Cautious with IVFs given recent EF of 30-35%.  No plans for ischemic cardiac work up given morbid obesity and comorbidities.  Electronic Signatures: Kathlyn Sacramento (MD)  (Signed 03-Dec-15 09:02)  Authored: Brief Consult Note   Last Updated: 03-Dec-15 09:02 by Kathlyn Sacramento (MD)

## 2015-03-24 NOTE — Consult Note (Signed)
PATIENT NAME:  Kathy Simpson, Kathy Simpson MR#:  [id number] DATE OF BIRTH:  07/08/1951  DATE OF CONSULTATION:  10/30/2014  REFERRING PHYSICIAN:  Dr. Manuella Ghazi CONSULTING PHYSICIAN:  Cheral Marker. Ola Spurr, MD  REASON FOR CONSULTATION: Sepsis.   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 She states he was at a skilled nursing facility following a left leg fracture during which time she was at [facility] and treated with a cast. During that time, she also had issues with a urinary tract infection, as well as CHF. Yesterday morning prior to going to the orthopedist in followup, she had an episode of nausea, vomiting, and then started coughing heavily. She went to the doctor and had her cast removed and received a cortisone injection. However, later that day she was noted to have a fever of 103 and became increasingly lethargic and confused. She was started on broad-spectrum antibiotics. Currently, she is weaning off pressors and is much more awake and alert. She has no further nausea, vomiting. She is only coughing a small amount. She is tolerating a clear diet.   PAST MEDICAL HISTORY: 1.  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  2.  Obstructive sleep apnea, on CPAP.  3.  CHF.  4.  Type 2 diabetes.  5.  Morbid obesity.   SOCIAL HISTORY: No tobacco, alcohol or drugs. She is a former Marine scientist at [employer]. She uses a walker at baseline and is currently at [facility].   FAMILY HISTORY: Noncontributory.   REVIEW OF SYSTEMS: Eleven systems reviewed and negative except as per HPI.   ALLERGIES: No known drug allergies.   ANTIBIOTICS SINCE ADMISSION: Include Zosyn and vancomycin. She is also on Solu-Medrol.   PHYSICAL EXAMINATION: VITAL SIGNS: Temperature 98.1, pulse 100, blood pressure 129/60, respirations 20, sat 99% on room air. She has been afebrile since  admission.  GENERAL: She is morbidly obese, lying in bed in no acute distress.  HEENT: Pupils equal, round, reactive to light and accommodation. Extraocular movements are intact. Sclerae are anicteric. Oropharynx is somewhat dry, but clear.  NECK: Supple.  HEART: Distant.  LUNGS: Clear but very hard to hear given body habitus.  ABDOMEN: Morbidly obese, soft, nontender, nondistended. No hepatosplenomegaly.  EXTREMITIES: She has 1+ edema, bilateral lower extremities.  NEUROLOGIC: She is alert and oriented x 3. Grossly nonfocal neurologic exam.   DATA: White blood count on admission was 13.0, currently it is 13.8, hemoglobin 10.5, platelets 182,000. Blood cultures x 2 no growth. Urine culture negative. Urinalysis on admission had 54 white cells. Renal function on admission showed a creatinine of 2.88, currently it is 1.80. LFTs showed albumin 2.9, otherwise within normal limits. Troponins positive with a peak of 2.10.   IMAGING: Chest x-ray December 2 showed mild vascular congestion and subtle perihilar interstitial prominence. Followup x-ray the 3rd showed mild vascular congestion.   IMPRESSION: A 63 year old female with history of hyperpituitarism, morbid obesity, recent admission for leg fracture at [facility], complicated during that hospital course by a urinary tract infection and acute renal failure and congestive heart failure. She was admitted with a temperature to 103 and altered mental status. She describes a possible aspiration event that morning prior. Blood cultures and urine cultures are negative to date. Clinically she is improving.   RECOMMENDATIONS: 1.  I do suspect she likely had an aspiration event. She likely became so ill because of her hypopituitarism.  I would recommend we continue Augmentin at this point. If she worsens, I would suggest restarting vancomycin and Zosyn and reculturing. Continue pulse dose steroids for stress response.  2.  If she continues to improve, could be  continued on a 10 day total antibiotic course.   Thank you for the consultation. I will be glad to follow with you.     ____________________________ Cheral Marker. Ola Spurr, MD dpf:at D: 10/30/2014 14:40:09 ET T: 10/30/2014 14:53:52 ET JOB#: [id number]  cc: Cheral Marker. Ola Spurr, MD, <Dictator> Alzena Gerber Ola Spurr MD ELECTRONICALLY SIGNED 11/29/2014 21:19

## 2015-03-24 NOTE — Consult Note (Signed)
PATIENT NAME:  Kathy Simpson, Kathy Simpson MR#:  [id number] DATE OF BIRTH:  [birth year]  DATE OF CONSULTATION:  10/29/2014  REFERRING PHYSICIAN:  Dr. Manuella Ghazi CONSULTING PHYSICIAN:  Rogue Jury A. Fletcher Anon, MD  REASON FOR CONSULTATION: Myocardial infarction.   HISTORY OF PRESENT ILLNESS: This is a 64 year old Caucasian female with past medical history of hypopituitarism, hypothyroidism, status post pituitary tumor resection, morbid obesity, obstructive sleep apnea, chronic systolic heart failure, and recent admission for sepsis. The patient had a leg fracture about 6 weeks ago. She was hospitalized at [facility] in October for UTI and congestive heart failure. Ejection fraction was found to be 30% to 35%. She was treated with antibiotics with subsequent improvement. She was discharged to [facility]. She was noted yesterday to be febrile with altered mental status. She was thus brought to the Emergency Room and was started on antibiotics. She was in acute on chronic renal failure. Currently she is more alert. She denies any chest pain or shortness of breath. Troponin went up to 2. EKG showed sinus tachycardia with no ischemic ST changes.   PAST MEDICAL HISTORY: 1.  Morbid obesity.  2.  Sacral decubitus.  3.  Hypothyroidism.  4.  Hypopituitarism.  5.  Obstructive sleep apnea, on CPAP therapy.  6.  Chronic systolic heart failure with ejection fraction of 30% to 35%.  7.  Type 2 diabetes insulin requiring.  8.  Hypertension.   SOCIAL HISTORY: Negative for alcohol, tobacco or drug use. She uses a walker at baseline.   FAMILY HISTORY: Family history is remarkable for congestive heart failure.   ALLERGIES: NO KNOWN DRUG ALLERGIES.   HOME MEDICATIONS:  1.  Hydrocortisone 20 mg daily.  2.  Lantus 30 units at bedtime.  3.  Benadryl 25 mg every 4 hours.  4.  Zofran 4 mg daily.  5.  Potassium 20 mEq twice daily.  6.  Carvedilol 6.25 mg twice daily.  7.  Celexa 20 mg once daily.  8.  Lasix 40 mg once daily.   9.  Lisinopril 20 mg once daily.   REVIEW OF SYSTEMS: A 10-point review of systems was performed. It is negative other than what is mentioned in the HPI.   PHYSICAL EXAMINATION: GENERAL: The patient is morbidly obese, is currently in no acute distress.  VITAL SIGNS: Temperature 98.6, pulse is 124, blood pressure is 115/52, and oxygen saturation is 96% on 5 liters nasal cannula.  HEENT: Normocephalic, atraumatic.  NECK: No JVD or carotid bruits.  RESPIRATORY: Normal respiratory effort with no use of accessory muscles. Auscultation reveals normal breath sounds.  CARDIOVASCULAR: Distant heart sounds. PMI is not palpated. Normal S1 and S2 with no gallops or murmurs.  ABDOMEN: Benign, nontender, and nondistended.  EXTREMITIES: Trace edema bilaterally.  SKIN: Warm and dry with no rash.  PSYCHIATRIC: She is alert, oriented x 3 with normal mood and affect.   LABORATORY AND DIAGNOSTIC DATA: ECG showed sinus tachycardia with no significant ST or T wave changes. Labs were remarkable for a creatinine of 2.88. Troponin initially was 0.25 and increased to 2.10.   IMPRESSION: 1.  Septic shock.  2.  Non-ST-elevation myocardial infarction.  3.  Chronic systolic heart failure.  4.  Morbid obesity.   RECOMMENDATIONS: The patient's presentation is consistent with septic shock. She is currently hypotensive requiring Levophed. Her mental status appears to be improved, and urine output seems to be good at the present time. She is currently on broad-spectrum antibiotics. She does have elevated troponin with no EKG changes and  no symptoms suggestive of angina. This could be due to supply/demand ischemia related to underlying septic shock. However, she has multiple risk factors for coronary artery disease. I agree with unfractionated heparin for 48 hours. I added aspirin 81 mg once daily. An echocardiogram was ordered to evaluate for any wall motion abnormalities. Given the patient's morbid obesity and multiple  comorbidities, she is not a candidate for further ischemic workup at the present time. Continue supportive care. Once she is no longer hypotensive, treatment with carvedilol and lisinopril can be resumed.    ____________________________ Mertie Clause. Fletcher Anon, MD maa:at D: 10/29/2014 09:08:02 ET T: 10/29/2014 10:33:33 ET JOB#: [id number]  cc: Kafi Dotter A. Fletcher Anon, MD, <Dictator> Wellington Hampshire MD ELECTRONICALLY SIGNED 11/27/2014 9:34

## 2015-05-05 DIAGNOSIS — M25561 Pain in right knee: Secondary | ICD-10-CM | POA: Diagnosis not present

## 2015-05-05 DIAGNOSIS — M25562 Pain in left knee: Secondary | ICD-10-CM | POA: Diagnosis not present

## 2015-07-13 DIAGNOSIS — E0965 Drug or chemical induced diabetes mellitus with hyperglycemia: Secondary | ICD-10-CM | POA: Diagnosis not present

## 2015-07-13 DIAGNOSIS — I10 Essential (primary) hypertension: Secondary | ICD-10-CM | POA: Diagnosis not present

## 2015-07-13 DIAGNOSIS — E236 Other disorders of pituitary gland: Secondary | ICD-10-CM | POA: Diagnosis not present

## 2015-07-13 DIAGNOSIS — Z6841 Body Mass Index (BMI) 40.0 and over, adult: Secondary | ICD-10-CM | POA: Diagnosis not present

## 2015-07-13 DIAGNOSIS — Z794 Long term (current) use of insulin: Secondary | ICD-10-CM | POA: Diagnosis not present

## 2015-08-23 DIAGNOSIS — E236 Other disorders of pituitary gland: Secondary | ICD-10-CM | POA: Diagnosis not present

## 2015-08-23 DIAGNOSIS — Z23 Encounter for immunization: Secondary | ICD-10-CM | POA: Diagnosis not present

## 2015-08-23 DIAGNOSIS — E0965 Drug or chemical induced diabetes mellitus with hyperglycemia: Secondary | ICD-10-CM | POA: Diagnosis not present

## 2015-12-17 ENCOUNTER — Other Ambulatory Visit: Payer: Self-pay

## 2015-12-17 DIAGNOSIS — Z1231 Encounter for screening mammogram for malignant neoplasm of breast: Secondary | ICD-10-CM

## 2016-01-04 DIAGNOSIS — E236 Other disorders of pituitary gland: Secondary | ICD-10-CM | POA: Diagnosis not present

## 2016-01-04 DIAGNOSIS — Z7984 Long term (current) use of oral hypoglycemic drugs: Secondary | ICD-10-CM | POA: Diagnosis not present

## 2016-01-04 DIAGNOSIS — Z13820 Encounter for screening for osteoporosis: Secondary | ICD-10-CM | POA: Diagnosis not present

## 2016-01-04 DIAGNOSIS — T380X5A Adverse effect of glucocorticoids and synthetic analogues, initial encounter: Secondary | ICD-10-CM | POA: Diagnosis not present

## 2016-01-04 DIAGNOSIS — E099 Drug or chemical induced diabetes mellitus without complications: Secondary | ICD-10-CM | POA: Diagnosis not present

## 2016-01-12 ENCOUNTER — Ambulatory Visit
Admission: RE | Admit: 2016-01-12 | Discharge: 2016-01-12 | Disposition: A | Discharge status: Home / Self Care | Payer: Commercial Managed Care - HMO | Source: Ambulatory Visit

## 2016-01-12 DIAGNOSIS — Z1231 Encounter for screening mammogram for malignant neoplasm of breast: Secondary | ICD-10-CM

## 2016-02-02 DIAGNOSIS — Z7984 Long term (current) use of oral hypoglycemic drugs: Secondary | ICD-10-CM | POA: Diagnosis not present

## 2016-02-02 DIAGNOSIS — M859 Disorder of bone density and structure, unspecified: Secondary | ICD-10-CM | POA: Diagnosis not present

## 2016-02-02 DIAGNOSIS — E099 Drug or chemical induced diabetes mellitus without complications: Secondary | ICD-10-CM | POA: Diagnosis not present

## 2016-02-02 DIAGNOSIS — M8589 Other specified disorders of bone density and structure, multiple sites: Secondary | ICD-10-CM | POA: Diagnosis not present

## 2016-02-07 DIAGNOSIS — M654 Radial styloid tenosynovitis [de Quervain]: Secondary | ICD-10-CM | POA: Diagnosis not present

## 2016-02-07 DIAGNOSIS — M25562 Pain in left knee: Secondary | ICD-10-CM | POA: Diagnosis not present

## 2016-02-07 DIAGNOSIS — M25561 Pain in right knee: Secondary | ICD-10-CM | POA: Diagnosis not present

## 2016-03-08 DIAGNOSIS — N760 Acute vaginitis: Secondary | ICD-10-CM | POA: Diagnosis not present

## 2016-03-08 DIAGNOSIS — Z01419 Encounter for gynecological examination (general) (routine) without abnormal findings: Secondary | ICD-10-CM | POA: Diagnosis not present

## 2016-03-08 DIAGNOSIS — Z124 Encounter for screening for malignant neoplasm of cervix: Secondary | ICD-10-CM | POA: Diagnosis not present

## 2016-03-08 DIAGNOSIS — Z6841 Body Mass Index (BMI) 40.0 and over, adult: Secondary | ICD-10-CM | POA: Diagnosis not present

## 2016-03-20 DIAGNOSIS — H2513 Age-related nuclear cataract, bilateral: Secondary | ICD-10-CM | POA: Diagnosis not present

## 2016-03-20 DIAGNOSIS — E119 Type 2 diabetes mellitus without complications: Secondary | ICD-10-CM | POA: Diagnosis not present

## 2016-03-20 DIAGNOSIS — H43812 Vitreous degeneration, left eye: Secondary | ICD-10-CM | POA: Diagnosis not present

## 2016-04-27 ENCOUNTER — Emergency Department (HOSPITAL_COMMUNITY)
Admission: EM | Admit: 2016-04-27 | Discharge: 2016-04-28 | Disposition: A | Discharge status: Home / Self Care | Payer: Commercial Managed Care - HMO | Attending: Emergency Medicine | Admitting: Emergency Medicine

## 2016-04-27 ENCOUNTER — Emergency Department (HOSPITAL_COMMUNITY): Payer: Commercial Managed Care - HMO

## 2016-04-27 ENCOUNTER — Encounter (HOSPITAL_COMMUNITY): Payer: Self-pay | Admitting: *Deleted

## 2016-04-27 DIAGNOSIS — K002 Abnormalities of size and form of teeth: Secondary | ICD-10-CM | POA: Diagnosis not present

## 2016-04-27 DIAGNOSIS — R1084 Generalized abdominal pain: Secondary | ICD-10-CM | POA: Diagnosis not present

## 2016-04-27 DIAGNOSIS — R110 Nausea: Secondary | ICD-10-CM | POA: Diagnosis not present

## 2016-04-27 DIAGNOSIS — R531 Weakness: Secondary | ICD-10-CM | POA: Insufficient documentation

## 2016-04-27 DIAGNOSIS — E039 Hypothyroidism, unspecified: Secondary | ICD-10-CM | POA: Diagnosis not present

## 2016-04-27 DIAGNOSIS — Z79899 Other long term (current) drug therapy: Secondary | ICD-10-CM | POA: Diagnosis not present

## 2016-04-27 DIAGNOSIS — R109 Unspecified abdominal pain: Secondary | ICD-10-CM

## 2016-04-27 DIAGNOSIS — K5901 Slow transit constipation: Secondary | ICD-10-CM | POA: Diagnosis not present

## 2016-04-27 DIAGNOSIS — E119 Type 2 diabetes mellitus without complications: Secondary | ICD-10-CM | POA: Insufficient documentation

## 2016-04-27 DIAGNOSIS — I10 Essential (primary) hypertension: Secondary | ICD-10-CM | POA: Insufficient documentation

## 2016-04-27 DIAGNOSIS — R112 Nausea with vomiting, unspecified: Secondary | ICD-10-CM | POA: Diagnosis present

## 2016-04-27 DIAGNOSIS — Z7982 Long term (current) use of aspirin: Secondary | ICD-10-CM | POA: Insufficient documentation

## 2016-04-27 DIAGNOSIS — K5730 Diverticulosis of large intestine without perforation or abscess without bleeding: Secondary | ICD-10-CM | POA: Diagnosis not present

## 2016-04-27 LAB — URINALYSIS, ROUTINE W REFLEX MICROSCOPIC
Bilirubin Urine: NEGATIVE
Ketones, ur: 40 mg/dL — AB
Nitrite: NEGATIVE
PH: 6 (ref 5.0–8.0)
PROTEIN: NEGATIVE mg/dL
SPECIFIC GRAVITY, URINE: 1.016 (ref 1.005–1.030)

## 2016-04-27 LAB — COMPREHENSIVE METABOLIC PANEL
ALT: 20 U/L (ref 14–54)
ANION GAP: 12 (ref 5–15)
AST: 17 U/L (ref 15–41)
Albumin: 3.1 g/dL — ABNORMAL LOW (ref 3.5–5.0)
Alkaline Phosphatase: 63 U/L (ref 38–126)
BUN: 9 mg/dL (ref 6–20)
CHLORIDE: 98 mmol/L — AB (ref 101–111)
CO2: 25 mmol/L (ref 22–32)
CREATININE: 1.01 mg/dL — AB (ref 0.44–1.00)
Calcium: 8.8 mg/dL — ABNORMAL LOW (ref 8.9–10.3)
GFR, EST NON AFRICAN AMERICAN: 58 mL/min — AB (ref >60)
Glucose, Bld: 286 mg/dL — ABNORMAL HIGH (ref 65–99)
POTASSIUM: 3.1 mmol/L — AB (ref 3.5–5.1)
SODIUM: 135 mmol/L (ref 135–145)
Total Bilirubin: 1.8 mg/dL — ABNORMAL HIGH (ref 0.3–1.2)
Total Protein: 6.9 g/dL (ref 6.5–8.1)

## 2016-04-27 LAB — CBC
HEMATOCRIT: 37.3 % (ref 36.0–46.0)
HEMOGLOBIN: 12.2 g/dL (ref 12.0–15.0)
MCH: 29.9 pg (ref 26.0–34.0)
MCHC: 32.7 g/dL (ref 30.0–36.0)
MCV: 91.4 fL (ref 78.0–100.0)
PLATELETS: 122 10*3/uL — AB (ref 150–400)
RBC: 4.08 MIL/uL (ref 3.87–5.11)
RDW: 14.7 % (ref 11.5–15.5)
WBC: 8.8 10*3/uL (ref 4.0–10.5)

## 2016-04-27 LAB — URINE MICROSCOPIC-ADD ON

## 2016-04-27 LAB — LIPASE, BLOOD: Lipase: 15 U/L (ref 11–51)

## 2016-04-27 MED ORDER — ACETAMINOPHEN 500 MG PO TABS
1000.0000 mg | ORAL_TABLET | Freq: Once | ORAL | Status: AC
  Administered 2016-04-27: 1000 mg via ORAL
  Filled 2016-04-27: qty 2

## 2016-04-27 MED ORDER — POTASSIUM CHLORIDE CRYS ER 20 MEQ PO TBCR
40.0000 meq | EXTENDED_RELEASE_TABLET | Freq: Once | ORAL | Status: AC
  Administered 2016-04-27: 40 meq via ORAL
  Filled 2016-04-27: qty 2

## 2016-04-27 MED ORDER — INSULIN ASPART 100 UNIT/ML ~~LOC~~ SOLN
4.0000 [IU] | Freq: Once | SUBCUTANEOUS | Status: AC
  Administered 2016-04-27: 4 [IU] via SUBCUTANEOUS
  Filled 2016-04-27: qty 1

## 2016-04-27 MED ORDER — ONDANSETRON HCL 4 MG/2ML IJ SOLN
4.0000 mg | Freq: Once | INTRAMUSCULAR | Status: AC | PRN
  Administered 2016-04-27: 4 mg via INTRAVENOUS
  Filled 2016-04-27: qty 2

## 2016-04-27 MED ORDER — SODIUM CHLORIDE 0.9 % IV BOLUS (SEPSIS)
1000.0000 mL | Freq: Once | INTRAVENOUS | Status: AC
  Administered 2016-04-27: 1000 mL via INTRAVENOUS

## 2016-04-27 NOTE — ED Notes (Signed)
Patient transported to X-ray 

## 2016-04-27 NOTE — ED Notes (Signed)
Pt to ED by [facility] c/o NV and weakness x 2 days. Denies pain.

## 2016-04-27 NOTE — ED Provider Notes (Signed)
CSN: [id number]     Arrival date & time 04/27/16  2034 History   First MD Initiated Contact with Patient 04/27/16 2050     Chief Complaint  Patient presents with  . Nausea  . Emesis     (Consider location/radiation/quality/duration/timing/severity/associated sxs/prior Treatment) HPI   Pt is a 65 yo female with PMH of HTN, DM-2, pituitary tumor with removal, IBS and morbid obesity who presents with the ED via EMS with complaint of N/V, onset 2 days. Pt reports having appx. 2-3 episodes of NBNB vomiting daily with associated dry heaving. She reports taking an old rx of phenergan at home but states she has not been able to keep anything down. Pt reports associated weakness. Denies fever, chills, HA, cough, SOB, wheezing, CP, abdominal pain, hemoptysis, constipation, urinary sxs, vaginal bleeding, vaginal d/c, numbness, tingling. She endorses having nonbloody diarrhea which she notes is chronic due to her IBS and unchanged. Denies hx of abdominal surgeries.   Past Medical History  Diagnosis Date  . Hypertension   . Diabetes mellitus without complication ([place])   . Obesity   . Hypothyroid   . Edema    Past Surgical History  Procedure Laterality Date  . Pituitary surgery  1984  . Back surgery    . Breast surgery      breast reduction  . Wrist surgery     No family history on file. Social History  Substance Use Topics  . Smoking status: Never Smoker   . Smokeless tobacco: Never Used  . Alcohol Use: No   OB History    No data available     Review of Systems  Gastrointestinal: Positive for nausea and vomiting.  Neurological: Positive for weakness.  All other systems reviewed and are negative.     Allergies  Dye fdc red and Statins  Home Medications   Prior to Admission medications   Medication Sig Start Date End Date Taking? Authorizing Provider  acetaminophen (TYLENOL) 500 MG tablet Take 1,500 mg by mouth every 6 (six) hours as needed for mild pain or headache.     Historical Provider, MD  Amino Acids-Protein Hydrolys (FEEDING SUPPLEMENT, PRO-STAT SUGAR FREE 64,) LIQD Take 30 mLs by mouth 2 (two) times daily with a meal. 09/24/14   Orson Eva, MD  aspirin EC 81 MG tablet Take 81 mg by mouth daily with breakfast.    Historical Provider, MD  carvedilol (COREG) 6.25 MG tablet Take 1 tablet (6.25 mg total) by mouth 2 (two) times daily with a meal. 09/24/14   Orson Eva, MD  escitalopram (LEXAPRO) 20 MG tablet Take 20 mg by mouth daily.    Historical Provider, MD  furosemide (LASIX) 40 MG tablet Take 1 tablet (40 mg total) by mouth daily. 09/24/14   Orson Eva, MD  gabapentin (NEURONTIN) 100 MG capsule Take 100 mg by mouth 2 (two) times daily.     Historical Provider, MD  hydrocortisone (CORTEF) 10 MG tablet Take 15 mg by mouth daily with breakfast.     Historical Provider, MD  hydrocortisone (CORTEF) 20 MG tablet Take 20 mg by mouth every evening.     Historical Provider, MD  levothyroxine (SYNTHROID, LEVOTHROID) 175 MCG tablet Take 175 mcg by mouth daily before breakfast.    Historical Provider, MD  linagliptin (TRADJENTA) 5 MG TABS tablet Take 1 tablet (5 mg total) by mouth daily. 09/24/14   Orson Eva, MD  lisinopril (PRINIVIL,ZESTRIL) 20 MG tablet Take 1 tablet (20 mg total) by mouth daily. 09/24/14  Orson Eva, MD  liver oil-zinc oxide (DESITIN) 40 % ointment Apply 1 application topically as needed for irritation.    Historical Provider, MD  oxyCODONE-acetaminophen (PERCOCET/ROXICET) 5-325 MG per tablet Take 1 tablet by mouth every 4 (four) hours as needed for severe pain. 09/24/14   Orson Eva, MD  potassium chloride SA (K-DUR,KLOR-CON) 20 MEQ tablet Take 1 tablet (20 mEq total) by mouth daily. 09/24/14   Orson Eva, MD  risperiDONE (RISPERDAL) 1 MG tablet Take 1 mg by mouth at bedtime.    Historical Provider, MD  rOPINIRole (REQUIP) 1 MG tablet Take 1 mg by mouth 2 (two) times daily.    Historical Provider, MD  senna-docusate (SENOKOT-S) 8.6-50 MG per tablet Take  1 tablet by mouth 2 (two) times daily. 09/24/14   Orson Eva, MD  spironolactone (ALDACTONE) 25 MG tablet Take 1 tablet (25 mg total) by mouth daily. 09/24/14   Orson Eva, MD  URELLE (URELLE/URISED) 81 MG TABS tablet Take 1 tablet (81 mg total) by mouth every 6 (six) hours as needed for bladder spasms. Stop after 09/26/14 doses 09/24/14   Orson Eva, MD   BP 122/55 mmHg  Pulse 90  Temp(Src) 98.5 F (36.9 C) (Oral)  Resp 20  SpO2 94% Physical Exam  Constitutional: She is oriented to person, place, and time. She appears well-developed and well-nourished.  Morbidly obese female  HENT:  Head: Normocephalic and atraumatic.  Mouth/Throat: Uvula is midline and oropharynx is clear and moist. Mucous membranes are dry. Abnormal dentition. No oropharyngeal exudate, posterior oropharyngeal edema, posterior oropharyngeal erythema or tonsillar abscesses.  Eyes: Conjunctivae and EOM are normal. Right eye exhibits no discharge. Left eye exhibits no discharge. No scleral icterus.  Neck: Normal range of motion. Neck supple.  Cardiovascular: Normal rate, regular rhythm, normal heart sounds and intact distal pulses.   Pulmonary/Chest: Effort normal and breath sounds normal. No respiratory distress. She has no wheezes. She has no rales. She exhibits no tenderness.  Abdominal: Soft. Bowel sounds are normal. She exhibits no distension and no mass. There is tenderness (mild diffuse tenderness). There is no rebound and no guarding.  Musculoskeletal: She exhibits no edema.  Neurological: She is alert and oriented to person, place, and time.  Skin: Skin is warm and dry. She is not diaphoretic.  Nursing note and vitals reviewed.   ED Course  Procedures (including critical care time) Labs Review Labs Reviewed  COMPREHENSIVE METABOLIC PANEL - Abnormal; Notable for the following:    Potassium 3.1 (*)    Chloride 98 (*)    Glucose, Bld 286 (*)    Creatinine, Ser 1.01 (*)    Calcium 8.8 (*)    Albumin 3.1 (*)     Total Bilirubin 1.8 (*)    GFR calc non Af Amer 58 (*)    All other components within normal limits  CBC - Abnormal; Notable for the following:    Platelets 122 (*)    All other components within normal limits  URINALYSIS, ROUTINE W REFLEX MICROSCOPIC (NOT AT [facility]) - Abnormal; Notable for the following:    Glucose, UA >1000 (*)    Hgb urine dipstick TRACE (*)    Ketones, ur 40 (*)    Leukocytes, UA SMALL (*)    All other components within normal limits  URINE MICROSCOPIC-ADD ON - Abnormal; Notable for the following:    Squamous Epithelial / LPF 0-5 (*)    Bacteria, UA RARE (*)    All other components within normal limits  URINE  CULTURE  LIPASE, BLOOD  CBG MONITORING, ED    Imaging Review Dg Abd Acute W/chest  04/27/2016  CLINICAL DATA:  Abdominal pain. EXAM: DG ABDOMEN ACUTE W/ 1V CHEST COMPARISON:  Chest radiographs 10/28/2014 FINDINGS: Mild cardiomegaly. Minimal interstitial prominence. No focal consolidation. No free intra-abdominal air. Mild gaseous gastric distention. Prominent small bowel loops in the left mid abdomen measuring 3.6 cm, nonspecific. Small volume of stool and air throughout the colon. Multiple pelvic phleboliths. Probable hepatic enlargement, difficult to assess due to divided technique secondary to habitus. Postsurgical change in the lumbar spine. Detailed evaluation limited by soft tissue attenuation from body habitus. IMPRESSION: 1. Prominent small bowel loops in the left mid abdomen, may reflect focal ileus, enteritis, or early small bowel obstruction could have this appearance. 2. Cardiomegaly.  No acute process in the chest. Electronically Signed   By: Jeb Levering M.D.   On: 04/27/2016 23:54   I have personally reviewed and evaluated these images and lab results as part of my medical decision-making.   EKG Interpretation None      MDM   Final diagnoses:  Abdominal pain    Pt presents with N/V and associated weakness. Hx of HTN and DM. VSS. Exam  revealed dry mucous membranes, mild diffuse abdominal tenderness, no peritoneal signs. Pt given IVF and zofran. K 3.1. Glucose 286, no AG. Pt given 4U insulin and PO potassium supplement. UA positive for trace hgb, ketones, glucose, small leuks, 6-30 WBCs and rare bacteria. Pt denies urinary sxs but will order urine culture.   On reevaluation, pt reports her nausea has improved, denies any episodes of vomiting, she reports her abdominal pain is unchanged. On reexamination, abdominal exam unchanged with mild diffuse tenderness throughout. Will order abdominal series xray to r/o obstruction. Abd xray showed prominent small bowel loops in the left mid abdomen, may reflect focal ileus, enteritis, or early small bowel obstruction could have this appearance. Will order CT abdomen for further evaluation. Discussed results and plan with pt. Pt reports her nausea is still improved and abdominal pain is unchanged but is still refusing pain meds.  Hand off to Delos Haring, PA-C. CT abdomen pending. Dispo pending imaging.   Chesley Noon [place], [place] 04/28/16 [id number]  Alfonzo Beers, MD 04/28/16 [phone]

## 2016-04-28 ENCOUNTER — Emergency Department (HOSPITAL_COMMUNITY): Payer: Commercial Managed Care - HMO

## 2016-04-28 ENCOUNTER — Encounter (HOSPITAL_COMMUNITY): Payer: Self-pay | Admitting: Radiology

## 2016-04-28 MED ORDER — IOPAMIDOL (ISOVUE-300) INJECTION 61%
INTRAVENOUS | Status: AC
  Administered 2016-04-28: 100 mL
  Filled 2016-04-28: qty 100

## 2016-04-28 NOTE — Discharge Instructions (Signed)

## 2016-04-28 NOTE — ED Notes (Signed)
Patient transported to CT 

## 2016-04-28 NOTE — ED Provider Notes (Signed)
Patient sign out from Harlene Ramus, PA-C at end of shift.  Kathy Simpson is a 65 yo female with PMH of HTN, DM-2, pituitary tumor with removal, IBS and morbid obesity who presents with the ED via EMS with complaint of N/V, onset 2 days.   The patient denies having any vomiting to myself but does endorse that she has been nauseous the past few days. She is due for a dental procedure soon to have all her remaining teeth pulled and dentures fitted for her.  Labs Reviewed  COMPREHENSIVE METABOLIC PANEL - Abnormal; Notable for the following:    Potassium 3.1 (*)    Chloride 98 (*)    Glucose, Bld 286 (*)    Creatinine, Ser 1.01 (*)    Calcium 8.8 (*)    Albumin 3.1 (*)    Total Bilirubin 1.8 (*)    GFR calc non Af Amer 58 (*)    All other components within normal limits  CBC - Abnormal; Notable for the following:    Platelets 122 (*)    All other components within normal limits  URINALYSIS, ROUTINE W REFLEX MICROSCOPIC (NOT AT [facility]) - Abnormal; Notable for the following:    Glucose, UA >1000 (*)    Hgb urine dipstick TRACE (*)    Ketones, ur 40 (*)    Leukocytes, UA SMALL (*)    All other components within normal limits  URINE MICROSCOPIC-ADD ON - Abnormal; Notable for the following:    Squamous Epithelial / LPF 0-5 (*)    Bacteria, UA RARE (*)    All other components within normal limits  URINE CULTURE  LIPASE, BLOOD  CBG MONITORING, ED   Plan per N[place] and Dr. Canary Brim is for CT abd/pelv and patient can go home if feeling better and CT is non acute. CLINICAL DATA: Abdominal pain and tenderness. Nausea and vomiting.  EXAM: CT ABDOMEN AND PELVIS WITH CONTRAST  TECHNIQUE: Multidetector CT imaging of the abdomen and pelvis was performed using the standard protocol following bolus administration of intravenous contrast.  CONTRAST: 100 cc Isovue-300 IV  COMPARISON: Radiographs yesterday  FINDINGS: Lower chest: Linear atelectasis in the left lower lobe. The heart  is enlarged. There are coronary artery calcifications.  Liver: Enlarged with diffusely decreased density consistent with steatosis. Liver measures 24 cm cranial caudal.  Hepatobiliary: Gallbladder physiologically distended, no calcified stone. No biliary dilatation.  Pancreas: No ductal dilatation or inflammation.  Spleen: Upper limits normal in size measuring 13.6 cm.  Adrenal glands: No nodule.  Kidneys: Symmetric renal enhancement. No hydronephrosis. Prominence of both renal pelvis in an extrarenal pelvis configuration. There is a 4.3 x 5.2 cm cyst in the interpolar right kidney posteriorly with thin peripheral calcifications.  Stomach/Bowel: Stomach physiologically distended. The small to moderate hiatal hernia. There are prominent small bowel loops in the left upper quadrant with mild fecalization of small bowel contents. No obstruction. No wall thickening or mesenteric edema. Moderate stool throughout the colon with multifocal colonic diverticulosis. No evidence of acute diverticulitis. Sigmoid colon is tortuous. The appendix is normal.  Vascular/Lymphatic: No retroperitoneal adenopathy. Abdominal aorta is normal in caliber. Mild atherosclerosis of the abdominal aorta without aneurysm.  Reproductive: Uterus and adnexa are normal for age.  Bladder: Physiologically distended. No wall thickening.  Other: No free air, free fluid, or intra-abdominal fluid collection. Small fat containing umbilical hernia.  Musculoskeletal: There are no acute or suspicious osseous abnormalities. There is posterior fusion L3 through L5 with interbody spacers.  IMPRESSION: 1. Prominent  small bowel loops on radiograph represented fecalized small bowel contents consistent with slow transit. No obstruction or bowel inflammation. 2. No acute abnormality in the abdomen/pelvis. 3. Colonic diverticulosis without diverticulitis. 4. Hepatomegaly and hepatic steatosis. Borderline  splenomegaly. 5. Bosniak type 2 right renal cyst. No specific follow-up is needed.   Electronically Signed  By: Jeb Levering M.D.  On: 04/28/2016 01:32   The CT scan shows fecalized small bowel contents associated with slow transit but no obstruction. No other significant finding. Results discussed with Dr. Claudine Mouton who recommends Miralax regimen for home.  We discussed signs and symptoms of small bowel obstruction or any other findings that would warrant prompt return to the emergency department. Otherwise she is to follow-up with the primary care doctor. Patient is comfortable with this plan, her questions were answered and her family is here to take her home. Patient currently says that she is currently pain-free and without nausea at this time.  Patient states that she does have mineralized at home but has not been using it swallows Phenergan which she has also not taken for nausea she needs it.  Delos Haring, PA-C 04/28/16 0205  Everlene Balls, MD 04/28/16 [phone]

## 2016-04-29 LAB — URINE CULTURE

## 2016-05-08 ENCOUNTER — Encounter (HOSPITAL_COMMUNITY): Payer: Self-pay | Admitting: *Deleted

## 2016-05-08 ENCOUNTER — Inpatient Hospital Stay (HOSPITAL_COMMUNITY)
Admission: EM | Admit: 2016-05-08 | Discharge: 2016-05-15 | DRG: 854 | Disposition: A | Discharge status: Home Health | Payer: Commercial Managed Care - HMO | Attending: Internal Medicine | Admitting: Internal Medicine

## 2016-05-08 DIAGNOSIS — T798XXD Other early complications of trauma, subsequent encounter: Secondary | ICD-10-CM | POA: Diagnosis not present

## 2016-05-08 DIAGNOSIS — R6510 Systemic inflammatory response syndrome (SIRS) of non-infectious origin without acute organ dysfunction: Secondary | ICD-10-CM

## 2016-05-08 DIAGNOSIS — T86822 Skin graft (allograft) (autograft) infection: Secondary | ICD-10-CM | POA: Diagnosis not present

## 2016-05-08 DIAGNOSIS — E669 Obesity, unspecified: Secondary | ICD-10-CM | POA: Diagnosis not present

## 2016-05-08 DIAGNOSIS — G4733 Obstructive sleep apnea (adult) (pediatric): Secondary | ICD-10-CM | POA: Diagnosis present

## 2016-05-08 DIAGNOSIS — F329 Major depressive disorder, single episode, unspecified: Secondary | ICD-10-CM | POA: Diagnosis present

## 2016-05-08 DIAGNOSIS — T148 Other injury of unspecified body region: Secondary | ICD-10-CM | POA: Diagnosis not present

## 2016-05-08 DIAGNOSIS — E2740 Unspecified adrenocortical insufficiency: Secondary | ICD-10-CM | POA: Diagnosis present

## 2016-05-08 DIAGNOSIS — A419 Sepsis, unspecified organism: Principal | ICD-10-CM | POA: Diagnosis present

## 2016-05-08 DIAGNOSIS — Z7982 Long term (current) use of aspirin: Secondary | ICD-10-CM

## 2016-05-08 DIAGNOSIS — E876 Hypokalemia: Secondary | ICD-10-CM | POA: Diagnosis present

## 2016-05-08 DIAGNOSIS — E119 Type 2 diabetes mellitus without complications: Secondary | ICD-10-CM

## 2016-05-08 DIAGNOSIS — Z7952 Long term (current) use of systemic steroids: Secondary | ICD-10-CM | POA: Diagnosis not present

## 2016-05-08 DIAGNOSIS — I110 Hypertensive heart disease with heart failure: Secondary | ICD-10-CM | POA: Diagnosis not present

## 2016-05-08 DIAGNOSIS — I10 Essential (primary) hypertension: Secondary | ICD-10-CM | POA: Diagnosis not present

## 2016-05-08 DIAGNOSIS — R0602 Shortness of breath: Secondary | ICD-10-CM | POA: Diagnosis not present

## 2016-05-08 DIAGNOSIS — L0390 Cellulitis, unspecified: Secondary | ICD-10-CM | POA: Diagnosis present

## 2016-05-08 DIAGNOSIS — S71101S Unspecified open wound, right thigh, sequela: Secondary | ICD-10-CM | POA: Diagnosis not present

## 2016-05-08 DIAGNOSIS — S71001S Unspecified open wound, right hip, sequela: Secondary | ICD-10-CM | POA: Diagnosis not present

## 2016-05-08 DIAGNOSIS — E230 Hypopituitarism: Secondary | ICD-10-CM | POA: Diagnosis present

## 2016-05-08 DIAGNOSIS — L03115 Cellulitis of right lower limb: Secondary | ICD-10-CM | POA: Diagnosis present

## 2016-05-08 DIAGNOSIS — B958 Unspecified staphylococcus as the cause of diseases classified elsewhere: Secondary | ICD-10-CM | POA: Diagnosis present

## 2016-05-08 DIAGNOSIS — T148XXA Other injury of unspecified body region, initial encounter: Secondary | ICD-10-CM

## 2016-05-08 DIAGNOSIS — E039 Hypothyroidism, unspecified: Secondary | ICD-10-CM | POA: Diagnosis present

## 2016-05-08 DIAGNOSIS — Z6841 Body Mass Index (BMI) 40.0 and over, adult: Secondary | ICD-10-CM

## 2016-05-08 DIAGNOSIS — M25551 Pain in right hip: Secondary | ICD-10-CM | POA: Diagnosis present

## 2016-05-08 DIAGNOSIS — D649 Anemia, unspecified: Secondary | ICD-10-CM | POA: Diagnosis present

## 2016-05-08 DIAGNOSIS — D72829 Elevated white blood cell count, unspecified: Secondary | ICD-10-CM | POA: Diagnosis not present

## 2016-05-08 DIAGNOSIS — S71001A Unspecified open wound, right hip, initial encounter: Secondary | ICD-10-CM | POA: Diagnosis not present

## 2016-05-08 DIAGNOSIS — Z01810 Encounter for preprocedural cardiovascular examination: Secondary | ICD-10-CM | POA: Diagnosis not present

## 2016-05-08 DIAGNOSIS — Z79899 Other long term (current) drug therapy: Secondary | ICD-10-CM

## 2016-05-08 DIAGNOSIS — S7001XA Contusion of right hip, initial encounter: Secondary | ICD-10-CM | POA: Diagnosis not present

## 2016-05-08 DIAGNOSIS — B370 Candidal stomatitis: Secondary | ICD-10-CM | POA: Diagnosis not present

## 2016-05-08 DIAGNOSIS — I5022 Chronic systolic (congestive) heart failure: Secondary | ICD-10-CM | POA: Diagnosis not present

## 2016-05-08 DIAGNOSIS — L089 Local infection of the skin and subcutaneous tissue, unspecified: Secondary | ICD-10-CM

## 2016-05-08 DIAGNOSIS — S7011XA Contusion of right thigh, initial encounter: Secondary | ICD-10-CM | POA: Diagnosis not present

## 2016-05-08 DIAGNOSIS — S71101A Unspecified open wound, right thigh, initial encounter: Secondary | ICD-10-CM | POA: Diagnosis not present

## 2016-05-08 LAB — CBC WITH DIFFERENTIAL/PLATELET
BASOS PCT: 0 %
Basophils Absolute: 0 10*3/uL (ref 0.0–0.1)
EOS ABS: 0.1 10*3/uL (ref 0.0–0.7)
EOS PCT: 1 %
HCT: 35.5 % — ABNORMAL LOW (ref 36.0–46.0)
HEMOGLOBIN: 11 g/dL — AB (ref 12.0–15.0)
Lymphocytes Relative: 14 %
Lymphs Abs: 1.8 10*3/uL (ref 0.7–4.0)
MCH: 29.1 pg (ref 26.0–34.0)
MCHC: 31 g/dL (ref 30.0–36.0)
MCV: 93.9 fL (ref 78.0–100.0)
MONOS PCT: 8 %
Monocytes Absolute: 1.1 10*3/uL — ABNORMAL HIGH (ref 0.1–1.0)
NEUTROS PCT: 77 %
Neutro Abs: 9.7 10*3/uL — ABNORMAL HIGH (ref 1.7–7.7)
PLATELETS: 356 10*3/uL (ref 150–400)
RBC: 3.78 MIL/uL — ABNORMAL LOW (ref 3.87–5.11)
RDW: 14.7 % (ref 11.5–15.5)
WBC: 12.6 10*3/uL — AB (ref 4.0–10.5)

## 2016-05-08 LAB — COMPREHENSIVE METABOLIC PANEL
ALK PHOS: 68 U/L (ref 38–126)
ALT: 10 U/L — AB (ref 14–54)
AST: 11 U/L — ABNORMAL LOW (ref 15–41)
Albumin: 2.4 g/dL — ABNORMAL LOW (ref 3.5–5.0)
Anion gap: 9 (ref 5–15)
BILIRUBIN TOTAL: 0.6 mg/dL (ref 0.3–1.2)
CALCIUM: 8.5 mg/dL — AB (ref 8.9–10.3)
CHLORIDE: 100 mmol/L — AB (ref 101–111)
CO2: 28 mmol/L (ref 22–32)
CREATININE: 0.55 mg/dL (ref 0.44–1.00)
Glucose, Bld: 176 mg/dL — ABNORMAL HIGH (ref 65–99)
Potassium: 4.3 mmol/L (ref 3.5–5.1)
Sodium: 137 mmol/L (ref 135–145)
TOTAL PROTEIN: 7.6 g/dL (ref 6.5–8.1)

## 2016-05-08 NOTE — ED Notes (Signed)
Pt here for wound to R hip.  Pt was brought to hospital by ambulance 2 weeks ago and her R hip was pinched by stretcher.  Noticed blister to same spot this weekend and has been placing zinc oxide on area.  Now area is open with redness to surrounding skin.  Pt's MD sent her her for surgical consult.

## 2016-05-09 ENCOUNTER — Emergency Department (HOSPITAL_COMMUNITY): Payer: Commercial Managed Care - HMO

## 2016-05-09 ENCOUNTER — Observation Stay (HOSPITAL_BASED_OUTPATIENT_CLINIC_OR_DEPARTMENT_OTHER): Payer: Commercial Managed Care - HMO

## 2016-05-09 ENCOUNTER — Encounter (HOSPITAL_COMMUNITY): Payer: Self-pay | Admitting: Internal Medicine

## 2016-05-09 DIAGNOSIS — Z01810 Encounter for preprocedural cardiovascular examination: Secondary | ICD-10-CM

## 2016-05-09 DIAGNOSIS — E039 Hypothyroidism, unspecified: Secondary | ICD-10-CM | POA: Diagnosis present

## 2016-05-09 DIAGNOSIS — G4733 Obstructive sleep apnea (adult) (pediatric): Secondary | ICD-10-CM

## 2016-05-09 DIAGNOSIS — I10 Essential (primary) hypertension: Secondary | ICD-10-CM

## 2016-05-09 DIAGNOSIS — A419 Sepsis, unspecified organism: Secondary | ICD-10-CM | POA: Diagnosis present

## 2016-05-09 DIAGNOSIS — L089 Local infection of the skin and subcutaneous tissue, unspecified: Secondary | ICD-10-CM

## 2016-05-09 DIAGNOSIS — I5043 Acute on chronic combined systolic (congestive) and diastolic (congestive) heart failure: Secondary | ICD-10-CM

## 2016-05-09 DIAGNOSIS — I5022 Chronic systolic (congestive) heart failure: Secondary | ICD-10-CM | POA: Diagnosis not present

## 2016-05-09 DIAGNOSIS — E119 Type 2 diabetes mellitus without complications: Secondary | ICD-10-CM | POA: Diagnosis not present

## 2016-05-09 DIAGNOSIS — I429 Cardiomyopathy, unspecified: Secondary | ICD-10-CM

## 2016-05-09 DIAGNOSIS — T148XXA Other injury of unspecified body region, initial encounter: Secondary | ICD-10-CM

## 2016-05-09 DIAGNOSIS — T148 Other injury of unspecified body region: Secondary | ICD-10-CM | POA: Diagnosis not present

## 2016-05-09 DIAGNOSIS — E669 Obesity, unspecified: Secondary | ICD-10-CM

## 2016-05-09 DIAGNOSIS — L0390 Cellulitis, unspecified: Secondary | ICD-10-CM | POA: Diagnosis present

## 2016-05-09 LAB — ECHOCARDIOGRAM COMPLETE
AO mean calculated velocity dopler: 159 cm/s
AV Area VTI index: 0.6 cm2/m2
AV Area VTI: 1.51 cm2
AV Area mean vel: 1.37 cm2
AV Mean grad: 11 mmHg
AV Peak grad: 17 mmHg
AV VEL mean LVOT/AV: 0.44
AV area mean vel ind: 0.49 cm2/m2
AV peak Index: 0.54
AV pk vel: 205 cm/s
AV vel: 1.69
Ao pk vel: 0.48 m/s
E decel time: 120 msec
E/e' ratio: 15.39
FS: 33 % (ref 28–44)
IVS/LV PW RATIO, ED: 1
LA ID, A-P, ES: 52 mm
LA diam end sys: 52 mm
LA diam index: 1.84 cm/m2
LA vol A4C: 69.1 ml
LA vol index: 26.1 mL/m2
LA vol: 73.6 mL
LV E/e' medial: 15.39
LV E/e'average: 15.39
LV PW d: 12.4 mm — AB (ref 0.6–1.1)
LV e' LATERAL: 5.77 cm/s
LVOT SV: 73 mL
LVOT VTI: 23.1 cm
LVOT area: 3.14 cm2
LVOT diameter: 20 mm
LVOT peak VTI: 0.54 cm
LVOT peak vel: 98.5 cm/s
MV Dec: 120
MV Peak grad: 3 mmHg
MV pk A vel: 123 m/s
MV pk E vel: 88.8 m/s
P 1/2 time: 275 ms
TDI e' lateral: 5.77
TDI e' medial: 7.29
VTI: 43 cm
Valve area index: 0.6
Valve area: 1.69 cm2

## 2016-05-09 LAB — BASIC METABOLIC PANEL
ANION GAP: 11 (ref 5–15)
BUN: <5 mg/dL — ABNORMAL LOW (ref 6–20)
CO2: 24 mmol/L (ref 22–32)
Calcium: 8.1 mg/dL — ABNORMAL LOW (ref 8.9–10.3)
Chloride: 101 mmol/L (ref 101–111)
Creatinine, Ser: 0.58 mg/dL (ref 0.44–1.00)
GFR calc Af Amer: >60 mL/min (ref >60)
GLUCOSE: 219 mg/dL — AB (ref 65–99)
POTASSIUM: 3.3 mmol/L — AB (ref 3.5–5.1)
SODIUM: 136 mmol/L (ref 135–145)

## 2016-05-09 LAB — PROTIME-INR
INR: 1.21 (ref 0.00–1.49)
Prothrombin Time: 15.5 seconds — ABNORMAL HIGH (ref 11.6–15.2)

## 2016-05-09 LAB — SEDIMENTATION RATE

## 2016-05-09 LAB — URINALYSIS, ROUTINE W REFLEX MICROSCOPIC
Bilirubin Urine: NEGATIVE
Hgb urine dipstick: NEGATIVE
Ketones, ur: 15 mg/dL — AB
LEUKOCYTES UA: NEGATIVE
Nitrite: NEGATIVE
PH: 6.5 (ref 5.0–8.0)
PROTEIN: NEGATIVE mg/dL
SPECIFIC GRAVITY, URINE: 1.023 (ref 1.005–1.030)

## 2016-05-09 LAB — GLUCOSE, CAPILLARY
GLUCOSE-CAPILLARY: 232 mg/dL — AB (ref 65–99)
GLUCOSE-CAPILLARY: 236 mg/dL — AB (ref 65–99)

## 2016-05-09 LAB — CBC
HEMATOCRIT: 31.1 % — AB (ref 36.0–46.0)
HEMOGLOBIN: 10 g/dL — AB (ref 12.0–15.0)
MCH: 29.5 pg (ref 26.0–34.0)
MCHC: 32.2 g/dL (ref 30.0–36.0)
MCV: 91.7 fL (ref 78.0–100.0)
PLATELETS: 349 10*3/uL (ref 150–400)
RBC: 3.39 MIL/uL — ABNORMAL LOW (ref 3.87–5.11)
RDW: 14.7 % (ref 11.5–15.5)
WBC: 8.5 10*3/uL (ref 4.0–10.5)

## 2016-05-09 LAB — I-STAT CG4 LACTIC ACID, ED
LACTIC ACID, VENOUS: 1.6 mmol/L (ref 0.5–2.0)
Lactic Acid, Venous: 2.17 mmol/L (ref 0.5–2.0)

## 2016-05-09 LAB — LACTIC ACID, PLASMA
Lactic Acid, Venous: 1.1 mmol/L (ref 0.5–2.0)
Lactic Acid, Venous: 0.9 mmol/L (ref 0.5–2.0)
Lactic Acid, Venous: 1.3 mmol/L (ref 0.5–2.0)

## 2016-05-09 LAB — TSH: TSH: 0.054 u[IU]/mL — AB (ref 0.350–4.500)

## 2016-05-09 LAB — URINE MICROSCOPIC-ADD ON: RBC / HPF: NONE SEEN RBC/hpf (ref 0–5)

## 2016-05-09 LAB — CBG MONITORING, ED
GLUCOSE-CAPILLARY: 325 mg/dL — AB (ref 65–99)
GLUCOSE-CAPILLARY: 345 mg/dL — AB (ref 65–99)

## 2016-05-09 LAB — APTT: aPTT: 44 seconds — ABNORMAL HIGH (ref 24–37)

## 2016-05-09 LAB — CORTISOL-AM, BLOOD: CORTISOL - AM: 94.6 ug/dL — AB (ref 6.7–22.6)

## 2016-05-09 LAB — HIV ANTIBODY (ROUTINE TESTING W REFLEX): HIV SCREEN 4TH GENERATION: NONREACTIVE

## 2016-05-09 LAB — C-REACTIVE PROTEIN: CRP: 41.4 mg/dL — AB (ref <1.0)

## 2016-05-09 LAB — PROCALCITONIN: PROCALCITONIN: 7.92 ng/mL

## 2016-05-09 LAB — BRAIN NATRIURETIC PEPTIDE: B NATRIURETIC PEPTIDE 5: 1320.8 pg/mL — AB (ref 0.0–100.0)

## 2016-05-09 MED ORDER — ESCITALOPRAM OXALATE 20 MG PO TABS
20.0000 mg | ORAL_TABLET | Freq: Every day | ORAL | Status: DC
  Administered 2016-05-09 – 2016-05-15 (×6): 20 mg via ORAL
  Filled 2016-05-09 (×2): qty 1
  Filled 2016-05-09: qty 2
  Filled 2016-05-09 (×3): qty 1

## 2016-05-09 MED ORDER — POTASSIUM CHLORIDE CRYS ER 10 MEQ PO TBCR
20.0000 meq | EXTENDED_RELEASE_TABLET | Freq: Every day | ORAL | Status: DC
  Filled 2016-05-09: qty 2

## 2016-05-09 MED ORDER — HYDROCORTISONE NA SUCCINATE PF 100 MG IJ SOLR
100.0000 mg | Freq: Once | INTRAMUSCULAR | Status: AC
  Administered 2016-05-09: 100 mg via INTRAVENOUS
  Filled 2016-05-09: qty 2

## 2016-05-09 MED ORDER — SODIUM CHLORIDE 0.9 % IV SOLN: INTRAVENOUS | Status: DC

## 2016-05-09 MED ORDER — ROPINIROLE HCL 1 MG PO TABS
2.0000 mg | ORAL_TABLET | Freq: Two times a day (BID) | ORAL | Status: DC
  Administered 2016-05-09 – 2016-05-15 (×12): 2 mg via ORAL
  Filled 2016-05-09 (×13): qty 2

## 2016-05-09 MED ORDER — PIPERACILLIN-TAZOBACTAM 3.375 G IVPB 30 MIN
3.3750 g | Freq: Once | INTRAVENOUS | Status: AC
  Administered 2016-05-09: 3.375 g via INTRAVENOUS
  Filled 2016-05-09: qty 50

## 2016-05-09 MED ORDER — FUROSEMIDE 10 MG/ML IJ SOLN
40.0000 mg | Freq: Two times a day (BID) | INTRAMUSCULAR | Status: DC
  Administered 2016-05-09: 40 mg via INTRAVENOUS
  Filled 2016-05-09: qty 4

## 2016-05-09 MED ORDER — HYDROCORTISONE 10 MG PO TABS: 10.0000 mg | ORAL_TABLET | Freq: Every day | ORAL | Status: DC

## 2016-05-09 MED ORDER — LEVOTHYROXINE SODIUM 75 MCG PO TABS
175.0000 ug | ORAL_TABLET | Freq: Every day | ORAL | Status: DC
  Administered 2016-05-09 – 2016-05-15 (×6): 175 ug via ORAL
  Filled 2016-05-09 (×7): qty 1

## 2016-05-09 MED ORDER — ACETAMINOPHEN 325 MG PO TABS
650.0000 mg | ORAL_TABLET | Freq: Once | ORAL | Status: AC
  Administered 2016-05-09: 650 mg via ORAL
  Filled 2016-05-09: qty 2

## 2016-05-09 MED ORDER — PERFLUTREN LIPID MICROSPHERE
INTRAVENOUS | Status: AC
  Filled 2016-05-09: qty 10

## 2016-05-09 MED ORDER — VANCOMYCIN HCL 10 G IV SOLR
1250.0000 mg | Freq: Three times a day (TID) | INTRAVENOUS | Status: DC
  Administered 2016-05-09 – 2016-05-12 (×9): 1250 mg via INTRAVENOUS
  Filled 2016-05-09 (×14): qty 1250

## 2016-05-09 MED ORDER — RISPERIDONE 0.5 MG PO TABS: 1.0000 mg | ORAL_TABLET | Freq: Every day | ORAL | Status: DC

## 2016-05-09 MED ORDER — GABAPENTIN 300 MG PO CAPS
600.0000 mg | ORAL_CAPSULE | Freq: Two times a day (BID) | ORAL | Status: DC
  Administered 2016-05-09 – 2016-05-15 (×11): 600 mg via ORAL
  Filled 2016-05-09 (×13): qty 2

## 2016-05-09 MED ORDER — PRO-STAT SUGAR FREE PO LIQD: 30.0000 mL | Freq: Two times a day (BID) | ORAL | Status: DC

## 2016-05-09 MED ORDER — SODIUM CHLORIDE 0.9% FLUSH
3.0000 mL | Freq: Two times a day (BID) | INTRAVENOUS | Status: DC
  Administered 2016-05-09 – 2016-05-15 (×6): 3 mL via INTRAVENOUS

## 2016-05-09 MED ORDER — POTASSIUM CHLORIDE CRYS ER 20 MEQ PO TBCR
40.0000 meq | EXTENDED_RELEASE_TABLET | Freq: Once | ORAL | Status: AC
  Administered 2016-05-09: 40 meq via ORAL
  Filled 2016-05-09: qty 2

## 2016-05-09 MED ORDER — ASPIRIN EC 81 MG PO TBEC
81.0000 mg | DELAYED_RELEASE_TABLET | Freq: Every day | ORAL | Status: DC
  Administered 2016-05-09 – 2016-05-15 (×6): 81 mg via ORAL
  Filled 2016-05-09 (×6): qty 1

## 2016-05-09 MED ORDER — URELLE 81 MG PO TABS: 1.0000 | ORAL_TABLET | Freq: Four times a day (QID) | ORAL | Status: DC | PRN

## 2016-05-09 MED ORDER — SENNOSIDES-DOCUSATE SODIUM 8.6-50 MG PO TABS: 1.0000 | ORAL_TABLET | Freq: Two times a day (BID) | ORAL | Status: DC

## 2016-05-09 MED ORDER — HYDROCORTISONE 20 MG PO TABS: 20.0000 mg | ORAL_TABLET | Freq: Two times a day (BID) | ORAL | Status: DC

## 2016-05-09 MED ORDER — PIPERACILLIN-TAZOBACTAM 3.375 G IVPB 30 MIN
3.3750 g | Freq: Three times a day (TID) | INTRAVENOUS | Status: DC
  Administered 2016-05-09 – 2016-05-12 (×8): 3.375 g via INTRAVENOUS
  Filled 2016-05-09 (×11): qty 50

## 2016-05-09 MED ORDER — VANCOMYCIN HCL 10 G IV SOLR
2000.0000 mg | Freq: Once | INTRAVENOUS | Status: AC
  Administered 2016-05-09: 2000 mg via INTRAVENOUS
  Filled 2016-05-09: qty 2000

## 2016-05-09 MED ORDER — SODIUM CHLORIDE 0.9 % IV BOLUS (SEPSIS)
500.0000 mL | Freq: Once | INTRAVENOUS | Status: AC
  Administered 2016-05-09: 500 mL via INTRAVENOUS

## 2016-05-09 MED ORDER — OXYCODONE-ACETAMINOPHEN 5-325 MG PO TABS
1.0000 | ORAL_TABLET | ORAL | Status: DC | PRN
  Administered 2016-05-10 – 2016-05-13 (×12): 1 via ORAL
  Filled 2016-05-09 (×13): qty 1

## 2016-05-09 MED ORDER — SODIUM CHLORIDE 0.9 % IV SOLN
INTRAVENOUS | Status: DC
  Administered 2016-05-12: 14:00:00 via INTRAVENOUS

## 2016-05-09 MED ORDER — HYDROCORTISONE 20 MG PO TABS
20.0000 mg | ORAL_TABLET | Freq: Two times a day (BID) | ORAL | Status: DC
  Administered 2016-05-09 – 2016-05-13 (×8): 20 mg via ORAL
  Filled 2016-05-09 (×13): qty 1

## 2016-05-09 MED ORDER — HYDROCORTISONE 20 MG PO TABS
20.0000 mg | ORAL_TABLET | Freq: Every day | ORAL | Status: DC
  Filled 2016-05-09: qty 1

## 2016-05-09 MED ORDER — INSULIN ASPART 100 UNIT/ML ~~LOC~~ SOLN
0.0000 [IU] | Freq: Three times a day (TID) | SUBCUTANEOUS | Status: DC
  Administered 2016-05-09 (×2): 7 [IU] via SUBCUTANEOUS
  Administered 2016-05-09 – 2016-05-11 (×3): 3 [IU] via SUBCUTANEOUS
  Administered 2016-05-11 – 2016-05-12 (×3): 2 [IU] via SUBCUTANEOUS
  Administered 2016-05-12: 5 [IU] via SUBCUTANEOUS
  Administered 2016-05-12 – 2016-05-13 (×2): 2 [IU] via SUBCUTANEOUS
  Administered 2016-05-13: 5 [IU] via SUBCUTANEOUS
  Administered 2016-05-13: 2 [IU] via SUBCUTANEOUS
  Administered 2016-05-14: 3 [IU] via SUBCUTANEOUS
  Administered 2016-05-14: 1 [IU] via SUBCUTANEOUS
  Administered 2016-05-15: 2 [IU] via SUBCUTANEOUS
  Administered 2016-05-15: 1 [IU] via SUBCUTANEOUS
  Filled 2016-05-09 (×2): qty 1

## 2016-05-09 MED ORDER — ACETAMINOPHEN 325 MG PO TABS
650.0000 mg | ORAL_TABLET | Freq: Four times a day (QID) | ORAL | Status: DC | PRN
  Administered 2016-05-09 – 2016-05-15 (×10): 650 mg via ORAL
  Filled 2016-05-09 (×10): qty 2

## 2016-05-09 MED ORDER — GABAPENTIN 100 MG PO CAPS: 300.0000 mg | ORAL_CAPSULE | Freq: Two times a day (BID) | ORAL | Status: DC

## 2016-05-09 MED ORDER — HYDROXYZINE HCL 50 MG/ML IM SOLN
25.0000 mg | Freq: Four times a day (QID) | INTRAMUSCULAR | Status: DC | PRN
  Filled 2016-05-09: qty 0.5

## 2016-05-09 MED ORDER — CARVEDILOL 12.5 MG PO TABS: 6.2500 mg | ORAL_TABLET | Freq: Two times a day (BID) | ORAL | Status: DC

## 2016-05-09 MED ORDER — PERFLUTREN LIPID MICROSPHERE
1.0000 mL | INTRAVENOUS | Status: AC | PRN
  Administered 2016-05-09: 3 mL via INTRAVENOUS
  Filled 2016-05-09: qty 10

## 2016-05-09 MED ORDER — PIPERACILLIN-TAZOBACTAM 3.375 G IVPB: 3.3750 g | Freq: Three times a day (TID) | INTRAVENOUS | Status: DC

## 2016-05-09 MED ORDER — ACETAMINOPHEN 650 MG RE SUPP: 650.0000 mg | Freq: Four times a day (QID) | RECTAL | Status: DC | PRN

## 2016-05-09 MED ORDER — VANCOMYCIN HCL IN DEXTROSE 1-5 GM/200ML-% IV SOLN: 1000.0000 mg | Freq: Once | INTRAVENOUS | Status: DC

## 2016-05-09 MED ORDER — SODIUM CHLORIDE 0.9 % IV BOLUS (SEPSIS)
1000.0000 mL | Freq: Once | INTRAVENOUS | Status: AC
  Administered 2016-05-09: 1000 mL via INTRAVENOUS

## 2016-05-09 MED ORDER — INSULIN ASPART 100 UNIT/ML ~~LOC~~ SOLN
0.0000 [IU] | Freq: Every day | SUBCUTANEOUS | Status: DC
  Administered 2016-05-09 – 2016-05-10 (×2): 2 [IU] via SUBCUTANEOUS
  Administered 2016-05-11: 4 [IU] via SUBCUTANEOUS

## 2016-05-09 MED ORDER — HYDRALAZINE HCL 20 MG/ML IJ SOLN: 5.0000 mg | INTRAMUSCULAR | Status: DC | PRN

## 2016-05-09 NOTE — Progress Notes (Signed)
Echocardiogram 2D Echocardiogram with Definity has been performed.  Tresa Res 05/09/2016, 4:47 PM

## 2016-05-09 NOTE — ED Notes (Signed)
Faxed lunch order @ 1125.

## 2016-05-09 NOTE — ED Notes (Signed)
Dr Niu at bedside 

## 2016-05-09 NOTE — Progress Notes (Signed)
Pharmacy Antibiotic Note  Kathy Simpson is a 65 y.o. female admitted on 05/08/2016 with infected hip wound/sepsis.  Pharmacy has been consulted for Vancomycin and Zosyn dosing.  Vancomycin 2 g IV given in ED at  0200.    Plan: Vancomycin 1250 mg IV q8h Zosyn 3.375 g IV q8h  F/U updated weight on arrival to floor     Temp (24hrs), Avg:99.2 F (37.3 C), Min:98.4 F (36.9 C), Max:100 F (37.8 C)   Recent Labs Lab 05/08/16 1800 05/09/16 0148 05/09/16 0333  WBC 12.6*  --   --   CREATININE 0.55  --   --   LATICACIDVEN  --  1.60 2.17*    CrCl cannot be calculated (Unknown ideal weight.).    Allergies  Allergen Reactions  . Metformin And Related Diarrhea  . Actos [Pioglitazone] Other (See Comments)    Congestive heart   . Adhesive [Tape] Other (See Comments)    Pulls off skin  . Dye Fdc Red [Red Dye] Nausea And Vomiting  . Enalapril Swelling  . Livalo [Pitavastatin] Other (See Comments)    Makes leg muscles weak   . Maxzide [Hydrochlorothiazide W-Triamterene] Swelling  . Morphine And Related Itching and Other (See Comments)    Mood changes  . Statins Other (See Comments)    Makes legs numb   . Zetia [Ezetimibe] Swelling  . Prostate [Nutritional Supplements] Hives and Rash    [name] 05/09/2016 4:54 AM

## 2016-05-09 NOTE — ED Notes (Signed)
CBG 325 

## 2016-05-09 NOTE — Evaluation (Signed)
Physical Therapy Evaluation Patient Details Name: Kathy Simpson MRN: [id number] DOB: 02/27/1951 Today's Date: 05/09/2016   History of Present Illness  Kathy Simpson is a 65 y.o. 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  Clinical Impression  Patient presents with decreased independence with mobility due to deficits listed in PT problem list.  She will benefit from skilled PT in the acute setting to allow return home with family support at d/c.     Follow Up Recommendations No PT follow up    Equipment Recommendations  None recommended by PT    Recommendations for Other Services       Precautions / Restrictions Precautions Precautions: Fall      Mobility  Bed Mobility Overal bed mobility: Needs Assistance Bed Mobility: Supine to Sit     Supine to sit: Supervision     General bed mobility comments: off gurney in ED with elevated HOB  Transfers Overall transfer level: Needs assistance Equipment used: Rolling walker (2 wheeled) Transfers: Sit to/from [facility] Sit to Stand: Min guard Stand pivot transfers: Min guard       General transfer comment: for safety  Ambulation/Gait Ambulation/Gait assistance: Min guard Ambulation Distance (Feet): 20 Feet Assistive device: Rolling walker (2 wheeled) Gait Pattern/deviations: Decreased stride length;Shuffle     General Gait Details: increased time, assist to hold chuck pad under R hip due to heavy drainage  Stairs            Wheelchair Mobility    Modified Rankin (Stroke Patients Only)       Balance Overall balance assessment: Needs assistance         Standing balance support: No upper extremity supported Standing balance-Leahy Scale: Fair Standing  balance comment: can stand without UE support, but needs walker or some UE support for ambulation                             Pertinent Vitals/Pain Pain Assessment: Faces Faces Pain Scale: Hurts little more Pain Location: R hip with getting into bed Pain Descriptors / Indicators: Sore Pain Intervention(s): Monitored during session;Repositioned    Home Living Family/patient expects to be discharged to:: Private residence Living Arrangements: Spouse/significant other Available Help at Discharge: Family;Available 24 hours/day Type of Home: House       Home Layout: One level Home Equipment: Clinch - 2 wheels;Wheelchair - power;Bedside commode      Prior Function Level of Independence: Needs assistance      ADL's / Homemaking Assistance Needed: spouse A for ADL's for lower body        Hand Dominance        Extremity/Trunk Assessment   Upper Extremity Assessment: Overall WFL for tasks assessed           Lower Extremity Assessment: Overall WFL for tasks assessed         Communication   Communication: No difficulties  Cognition Arousal/Alertness: Awake/alert Behavior During Therapy: WFL for tasks assessed/performed Overall Cognitive Status: Within Functional Limits for tasks assessed                      General Comments      Exercises        Assessment/Plan    PT Assessment Patient  needs continued PT services  PT Diagnosis Difficulty walking   PT Problem List Decreased mobility;Pain;Decreased skin integrity;Decreased balance  PT Treatment Interventions DME instruction;Gait training;Functional mobility training;Patient/family education;Therapeutic activities;Therapeutic exercise   PT Goals (Current goals can be found in the Care Plan section) Acute Rehab PT Goals Patient Stated Goal: To heal wound PT Goal Formulation: With patient/family Time For Goal Achievement: 05/13/16 Potential to Achieve Goals: Good    Frequency Min  3X/week   Barriers to discharge        Co-evaluation               End of Session   Activity Tolerance: Patient tolerated treatment well Patient left: in bed;Other (comment) (with RN to transport to room)      Functional Assessment Tool Used: Clinical Judgement Functional Limitation: Mobility: Walking and moving around Mobility: Walking and Moving Around Current Status [id number]): At least 20 percent but less than 40 percent impaired, limited or restricted Mobility: Walking and Moving Around Goal Status [phone]): At least 1 percent but less than 20 percent impaired, limited or restricted    Time: [phone] PT Time Calculation (min) (ACUTE ONLY): 28 min   Charges:   PT Evaluation $PT Eval Moderate Complexity: 1 Procedure PT Treatments $Gait Training: 8-22 mins   PT G Codes:   PT G-Codes **NOT FOR INPATIENT CLASS** Functional Assessment Tool Used: Clinical Judgement Functional Limitation: Mobility: Walking and moving around Mobility: Walking and Moving Around Current Status [id number]): At least 20 percent but less than 40 percent impaired, limited or restricted Mobility: Walking and Moving Around Goal Status [phone]): At least 1 percent but less than 20 percent impaired, limited or restricted    Reginia Naas 05/09/2016, 5:23 PM  Magda Kiel, [place] 05/09/2016

## 2016-05-09 NOTE — ED Notes (Signed)
Walked blood to minilab for drop off. Requested 2nd blood culture draw from 2 phlebotomists in lab ASAP so antibiotics can be started.

## 2016-05-09 NOTE — Anesthesia Preprocedure Evaluation (Addendum)
Anesthesia Evaluation  Patient identified by MRN, date of birth, ID band Patient awake    Reviewed: Allergy & Precautions, H&P , NPO status , Patient's Chart, lab work & pertinent test results  Airway Mallampati: II  TM Distance: >3 FB Neck ROM: Full    Dental no notable dental hx. (+) Edentulous Lower, Poor Dentition, Dental Advisory Given   Pulmonary sleep apnea ,    Pulmonary exam normal breath sounds clear to auscultation       Cardiovascular hypertension, Pt. on medications +CHF   Rhythm:Regular Rate:Normal     Neuro/Psych negative neurological ROS  negative psych ROS   GI/Hepatic negative GI ROS, Neg liver ROS,   Endo/Other  diabetes, Type 2, Oral Hypoglycemic AgentsHypothyroidism Morbid obesity  Renal/GU negative Renal ROS  negative genitourinary   Musculoskeletal   Abdominal   Peds  Hematology negative hematology ROS (+)   Anesthesia Other Findings   Reproductive/Obstetrics negative OB ROS                           Anesthesia Physical Anesthesia Plan  ASA: III  Anesthesia Plan: General   Post-op Pain Management:    Induction: Intravenous  Airway Management Planned: Oral ETT  Additional Equipment:   Intra-op Plan:   Post-operative Plan: Extubation in OR and Possible Post-op intubation/ventilation  Informed Consent: I have reviewed the patients History and Physical, chart, labs and discussed the procedure including the risks, benefits and alternatives for the proposed anesthesia with the patient or authorized representative who has indicated his/her understanding and acceptance.   Dental advisory given  Plan Discussed with: CRNA  Anesthesia Plan Comments:         Anesthesia Quick Evaluation

## 2016-05-09 NOTE — Consult Note (Signed)
WOC wound consult note Reason for Consult: Right hip wound Wound type: Full thickness uncertain etiology  Measurement: 6.5 cm x 11 cm x 11 cm Wound bed: 100% dark red/bloody Drainage :  Heavy bloody drainage Periwound: Extensive periwound erythema extending upwards toward patient's back, around toward abdomen and distally. Dressing procedure/placement/frequency:  Sterile saline moistened gauze with dry ABD taped over site.  Awaiting surgical evaluation.  Patient states area started as a "blister" and significantly worsened over the weekend including loss of significant amount of blood while at home.  Area continues with bloody drainage.  Recommend surgical eval and treatment.  Discussed POC with patient and bedside nurse.  Re consult if needed, will not follow at this time. Thanks Val Riles MSN, RN, CNS-BC, [employer]

## 2016-05-09 NOTE — Consult Note (Signed)
Cardiology Consult    Patient ID: Kathy Simpson MRN: [id number], DOB/AGE: [birth year]   Admit date: 05/08/2016 Date of Consult: 05/09/2016  Primary Physician: [name], MD Primary Cardiologist: New Requesting Provider: Dr. Arrien/Internal Medicine  Patient Profile    Kathy Simpson is a 65 yo with PMH of HTN/DM II/Obesity/hypopituitarism and CHF who presented to the [facility] ED with a right hip wound.   Past Medical History   Past Medical History  Diagnosis Date  . Hypertension   . Diabetes mellitus without complication ([place])   . Obesity   . Hypothyroid   . Edema     Past Surgical History  Procedure Laterality Date  . Pituitary surgery  1984  . Back surgery    . Breast surgery      breast reduction  . Wrist surgery    . Knee surgery    . Tubal ligation       Allergies  Allergies  Allergen Reactions  . Metformin And Related Diarrhea  . Actos [Pioglitazone] Other (See Comments)    Congestive heart   . Adhesive [Tape] Other (See Comments)    Pulls off skin  . Dye Fdc Red [Red Dye] Nausea And Vomiting  . Enalapril Swelling  . Livalo [Pitavastatin] Other (See Comments)    Makes leg muscles weak   . Maxzide [Hydrochlorothiazide W-Triamterene] Swelling  . Morphine And Related Itching and Other (See Comments)    Mood changes  . Statins Other (See Comments)    Makes legs numb   . Zetia [Ezetimibe] Swelling  . Prostate [Nutritional Supplements] Hives and Rash    History of Present Illness    Kathy Simpson is a 65 yo female with PMH of  HTN/DM II/Obesity/hypopituitarism and CHF who has been seen by our service in the past but only during an inpatient admission back in 2015. At that time she was admitted for sepsis, and acute CHF. Her EF during that that time was reduced to 30-35%. She says when she was dc'ed from the hospital she was feeling well, and told that she would not need to follow with cardiology unless she developed s/s of CHF. Since that time  she reports doing well in her home. States she is very active and uses her walker to get around. Also uses the Wii game system on a daily basis to keep her active. She denies ever having chest pain, DOE, palpitations, light-headedness, dizziness or LEE.   Has been in her normal state of health, until the past couple of days. States when she was on the stretcher in the ED a couple of weeks ago, she believes she was pinched on her right hip. This has since has swelling intermittently, but has note been a problem until yesterday when she noticed sloughing of the skin and drainage. This morning she reports a breakdown of the skin, and drainage. She presented to the ED where her labs showed elevated BNP 1320, Lactic Acid 2.17, Procalcitonin 7.92, Cr 0.58, and chest x-ray with no active diease or edema. She received both vanc and zosyn, and treated with IV fluids. She has been admitted to Internal Medicine service and surgery called for surgical consult.  Cardiology has been called for pre-op evaluation.    Inpatient Medications    . aspirin EC  81 mg Oral Q breakfast  . escitalopram  20 mg Oral Daily  . gabapentin  600 mg Oral BID  . hydrocortisone  20 mg Oral BID WC  . insulin aspart  0-5 Units Subcutaneous QHS  . insulin aspart  0-9 Units Subcutaneous TID WC  . levothyroxine  175 mcg Oral QAC breakfast  . rOPINIRole  2 mg Oral BID  . sodium chloride flush  3 mL Intravenous Q12H    Family History    Family History  Problem Relation Age of Onset  . CAD Mother     s/p of CABG  . Hypertension Mother   . Lung cancer Mother   . Lung cancer Father   . Bronchitis Sister     Social History    Social History   Social History  . Marital Status: Married    Spouse Name: N/A  . Number of Children: N/A  . Years of Education: N/A   Occupational History  . Not on file.   Social History Main Topics  . Smoking status: Never Smoker   . Smokeless tobacco: Never Used  . Alcohol Use: No  . Drug  Use: No  . Sexual Activity: Not on file   Other Topics Concern  . Not on file   Social History Narrative     Review of Systems    General:  No chills, fever, night sweats or weight changes.  Cardiovascular:  No chest pain, dyspnea on exertion, edema, orthopnea, palpitations, paroxysmal nocturnal dyspnea. Dermatological: No rash, lesions/masses Respiratory: No cough, dyspnea Urologic: No hematuria, dysuria Abdominal:   No nausea, vomiting, diarrhea, bright red blood per rectum, melena, or hematemesis Neurologic:  No visual changes, wkns, changes in mental status. Skin: + wound All other systems reviewed and are otherwise negative except as noted above.  Physical Exam    Blood pressure 160/80, pulse 84, temperature 98.6 F (37 C), temperature source Oral, resp. rate 21, SpO2 94 %.  General: Pleasant  Morbidly obese female, NAD Psych: Normal affect. Neuro: Alert and oriented X 3. Moves all extremities spontaneously. HEENT: Normal  Neck: Supple without bruits or JVD. Lungs:  Resp regular and unlabored, CTA. Heart: RRR no s3, s4, or murmurs. Abdomen: Soft, non-tender, non-distended, BS + x 4.  Extremities: No clubbing, cyanosis, mild LE edema. DP/PT/Radials 2+ and equal bilaterally.  Large cellulitis to the right hip, with broken skin and drainage  Labs    Troponin (Point of Care Test) No results for input(s): TROPIPOC in the last 72 hours. No results for input(s): CKTOTAL, CKMB, TROPONINI in the last 72 hours. Lab Results  Component Value Date   WBC 8.5 June 03, 2016   HGB 10.0* 06/03/16   HCT 31.1* 2016/06/03   MCV 91.7 June 03, 2016   PLT 349 06-03-16    Recent Labs Lab 05/08/16 1800 Jun 03, 2016 0441  NA 137 136  K 4.3 3.3*  CL 100* 101  CO2 28 24  BUN <5* <5*  CREATININE 0.55 0.58  CALCIUM 8.5* 8.1*  PROT 7.6  --   BILITOT 0.6  --   ALKPHOS 68  --   ALT 10*  --   AST 11*  --   GLUCOSE 176* 219*   Lab Results  Component Value Date   CHOL 131 09/21/2014    HDL 25* 09/21/2014   LDLCALC 77 09/21/2014   TRIG 144 09/21/2014     Radiology Studies    Dg Chest Port 1 View  June 03, 2016  CLINICAL DATA:  Sepsis.  Shortness of breath. EXAM: PORTABLE CHEST 1 VIEW COMPARISON:  04/27/2016 FINDINGS: Mild cardiac enlargement without vascular congestion. No focal airspace disease or consolidation in the lungs. No blunting of costophrenic angles. No pneumothorax. Calcified and tortuous  aorta. Degenerative changes in the shoulders. IMPRESSION: Cardiac enlargement.  No evidence of active pulmonary disease. Electronically Signed   By: Lucienne Capers M.D.   On: 05/09/2016 01:46    ECG & Cardiac Imaging    EKG: 05/09/2016 SR Nonspecific T wave abnormalities noted in previous tracing  Echo: 08/2014  Study Conclusions  - Left ventricle: The cavity size was moderately dilated. Systolic function was moderately to severely reduced. The estimated ejection fraction was in the range of 30% to 35%. - Left atrium: The atrium was moderately dilated.  Assessment & Plan    Kathy Simpson is a 65 yo female with PMH of  HTN/DM II/Obesity/hypopituitarism and CHF who has been seen by our service in the past but only during an inpatient admission back in 2015. She presented to the [facility] ED with c/o right hip pain and open wound. Plans for surgical I&D of cellulitis during this admission.  1. Preop evaluation: Kathy Simpson is a pleasant older female who has been briefly seen by our service in the past, but no formal follow up. When we last saw her, she was admitted for sepsis and acute CHF in 2015. Since that time she reports doing well, and only seeing her PCP in the outpatient setting. Is very active at home, using a walker to get around the home and using the Wii fit on a regular basis. She denies having chest pain, DOE, palpitations, dizziness or LEE prior to this admission.  --She does present with some risk with surgical intervention given her hx of HTN, and reduced EF  on last Echo in 2015 --Will repeat echo for more recent EF report --Would recommend close post operative follow up for assessment of hypervolemia, or arrhythmias.  --I&O with daily weights to assess volume status  Dr. Meda Coffee to see  Signed, Reino Bellis, NP-C Pager [phone] 05/09/2016, 3:47 PM  The patient was seen, examined and discussed with Reino Bellis, NP-C and I agree with the above.   This is a very pleasant 65 year old female with prior medical history of obesity, hypertension, diabetes and per patient history of nonischemic cardiomyopathy with LVEF 30-35% in 2015. She doesn't see a cardiologist on a regular basis and follow with her primary care physician only. She was taking baby aspirin, Lasix 40 mg daily, spironolactone 12.5 mg daily, no ACEI/ARB or beta blockers.  She was admitted today with cellulitis and open wound about her right hip that requires I&D scheduled for tomorrow.  We were consulted for preop evaluation. The patient states that she walks with a walker mostly in the house but doesn't have to stop because of chest pain or shortness of breath, in fact she says she never had chest pain and recently hasn't had any orthopnea, paroxysmal nocturnal dyspnea no lower extremity edema no palpitations or syncope. Her admission labs show hypokalemia, normal creatinine BNP of 1300, Hb 10, low TSH 0.054.  On physical exam she has no obvious signs of fluid overload despite high BNP.  Her EKG shows sinus rhythm and nonspecific ST-T wave abnormalities pressure in the lateral leads no significant changes when compared to the prior EKG.  We will order an echocardiogram that will be done this afternoon to evaluate her LVEF, she doesn't have any significant murmur suggestive significant valvular disease.  If echocardiogram doesn't show any new wall motion abnormalities or lower LVEF she would be considered an intermediate risk for moderate risk surgery and and there should be  no contraindication from cardiac standpoint for  this patient to undergo her procedure tomorrow considering that she has no signs of angina, heart failure, doesn't seem to have any significant valvular abnormality. I would replace potassium.  Ena Dawley 05/09/2016

## 2016-05-09 NOTE — Progress Notes (Signed)
PT Cancellation Note  Patient Details Name: Kathy Simpson MRN: [id number] DOB: July 20, 1951   Cancelled Treatment:    Reason Eval/Treat Not Completed: Other (comment); patient reports waiting on surgical consult due to severe drainage R hip wound when sitting or standing.  Reports was able to get up fine yesterday, but wishes to wait for surgery to see her prior to mobilizing today.  Will attempt later as time permits.    Reginia Naas 05/09/2016, 12:44 PM  Magda Kiel, [place] 05/09/2016

## 2016-05-09 NOTE — ED Notes (Signed)
Called [facility] to activate Code Sepsis  

## 2016-05-09 NOTE — Care Management Note (Signed)
Case Management Note  Patient Details  Name: Kathy Simpson MRN: [id number] Date of Birth: [birth year]  Subjective/Objective:                  65 y.o. female admitted on 05/08/2016 with infected hip wound/sepsis./ Home with spouse.  Action/Plan: Follow for disposition needs. / Anticipate home health services for wound care.   Expected Discharge Date:  05/11/16               Expected Discharge Plan:  [place]  In-House Referral:     Discharge planning Services  CM Consult  Post Acute Care Choice:    Choice offered to:     DME Arranged:    DME Agency:     HH Arranged:    HH Agency:     Status of Service:  In process, will continue to follow  Medicare Important Message Given:    Date Medicare IM Given:    Medicare IM give by:    Date Additional Medicare IM Given:    Additional Medicare Important Message give by:     If discussed at [place] of Stay Meetings, dates discussed:    Additional Comments: EDCM met with pt and husband at bedside.  Pt states she has had home health services in the past with [facility] but refers to have home health with agency in network.  Pt states that she has gone to SNF for rehab in the past but prefers returning home after this admission. Fuller Mandril, RN 05/09/2016, 11:01 AM

## 2016-05-09 NOTE — ED Notes (Signed)
Unable to find a second potential IV site. Asked secretary to call phlebotomy to draw second cultures before starting antibiotics.

## 2016-05-09 NOTE — ED Notes (Signed)
Family at bedside. 

## 2016-05-09 NOTE — ED Provider Notes (Signed)
CSN: [id number]     Arrival date & time 05/08/16  1531 History  By signing my name below, I, Kathy Simpson, attest that this documentation has been prepared under the direction and in the presence of Joseph Berkshire, MD.  Electronically Signed: Reola Simpson, ED Scribe. 05/09/2016. 12:50 AM.   Chief Complaint  Patient presents with  . Recurrent Skin Infections    The history is provided by the patient. No language interpreter was used.   HPI Comments: Kathy Simpson is a 65 y.o. 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 She has associated discharge, bleeding, pain, and erythema to the area. She reports running a low grade fever upon arrival, but denies having a fever as her symptoms have progressed. She states that two weeks ago on 04/27/16 she was seen in the ED for N/V when her R hip was pinched by a stretcher, giving her a blister. She states that the blister has worsened to the wound that has brought her to the ED today. She has tried to apply topical Zinc Oxide on area w/ no relief. She currently takes 500mg  of Amoxicillin TID from her previous ED visit. 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 She is compliant with all medications. No other alleviating factors noted at this time.    Past Medical History  Diagnosis Date  . Hypertension   . Diabetes mellitus without complication ([place])   . Obesity   . Hypothyroid   . Edema    Past Surgical History  Procedure Laterality Date  . Pituitary surgery  1984  . Back surgery    . Breast surgery      breast reduction  . Wrist surgery    . Knee surgery    . Tubal ligation     No family history on file. Social History  Substance Use Topics  . Smoking status: Never Smoker   . Smokeless tobacco: Never Used   . Alcohol Use: No   OB History    No data available     Review of Systems  Constitutional: Positive for fever.  Respiratory: Negative for shortness of breath and wheezing.   Skin: Positive for wound.       +erythema, pain, and foul discharge  All other systems reviewed and are negative.   Allergies  Actos; Adhesive; Dye fdc red; Enalapril; Livalo; Maxzide; Metformin and related; Morphine and related; Statins; and Zetia  Home Medications   Prior to Admission medications   Medication Sig Start Date End Date Taking? Authorizing Provider  acetaminophen (TYLENOL) 500 MG tablet Take 1,500 mg by mouth every 6 (six) hours as needed for mild pain or headache.    Historical Provider, MD  Amino Acids-Protein Hydrolys (FEEDING SUPPLEMENT, PRO-STAT SUGAR FREE 64,) LIQD Take 30 mLs by mouth 2 (two) times daily with a meal. 09/24/14   Orson Eva, MD  aspirin EC 81 MG tablet Take 81 mg by mouth daily with breakfast.    Historical Provider, MD  carvedilol (COREG) 6.25 MG tablet Take 1 tablet (6.25 mg total) by mouth 2 (two) times daily with a meal. 09/24/14   Orson Eva, MD  escitalopram (LEXAPRO) 20 MG tablet Take 20 mg by mouth daily.    Historical Provider, MD  furosemide (LASIX) 40 MG tablet Take 1 tablet (40  mg total) by mouth daily. 09/24/14   Orson Eva, MD  gabapentin (NEURONTIN) 100 MG capsule Take 100 mg by mouth 2 (two) times daily.     Historical Provider, MD  hydrocortisone (CORTEF) 10 MG tablet Take 15 mg by mouth daily with breakfast.     Historical Provider, MD  hydrocortisone (CORTEF) 20 MG tablet Take 20 mg by mouth every evening.     Historical Provider, MD  levothyroxine (SYNTHROID, LEVOTHROID) 175 MCG tablet Take 175 mcg by mouth daily before breakfast.    Historical Provider, MD  linagliptin (TRADJENTA) 5 MG TABS tablet Take 1 tablet (5 mg total) by mouth daily. 09/24/14   Orson Eva, MD  lisinopril (PRINIVIL,ZESTRIL) 20 MG tablet Take 1 tablet (20 mg total) by mouth daily.  09/24/14   Orson Eva, MD  liver oil-zinc oxide (DESITIN) 40 % ointment Apply 1 application topically as needed for irritation.    Historical Provider, MD  oxyCODONE-acetaminophen (PERCOCET/ROXICET) 5-325 MG per tablet Take 1 tablet by mouth every 4 (four) hours as needed for severe pain. 09/24/14   Orson Eva, MD  potassium chloride SA (K-DUR,KLOR-CON) 20 MEQ tablet Take 1 tablet (20 mEq total) by mouth daily. 09/24/14   Orson Eva, MD  risperiDONE (RISPERDAL) 1 MG tablet Take 1 mg by mouth at bedtime.    Historical Provider, MD  rOPINIRole (REQUIP) 1 MG tablet Take 1 mg by mouth 2 (two) times daily.    Historical Provider, MD  senna-docusate (SENOKOT-S) 8.6-50 MG per tablet Take 1 tablet by mouth 2 (two) times daily. 09/24/14   Orson Eva, MD  spironolactone (ALDACTONE) 25 MG tablet Take 1 tablet (25 mg total) by mouth daily. 09/24/14   Orson Eva, MD  URELLE (URELLE/URISED) 81 MG TABS tablet Take 1 tablet (81 mg total) by mouth every 6 (six) hours as needed for bladder spasms. Stop after 09/26/14 doses 09/24/14   Orson Eva, MD   BP 155/77 mmHg  Pulse 108  Temp(Src) 100 F (37.8 C) (Oral)  Resp 19  SpO2 100%   Physical Exam  Constitutional: She is oriented to person, place, and time. She appears well-developed and well-nourished. No distress.  HENT:  Head: Normocephalic and atraumatic.  Right Ear: Hearing normal.  Left Ear: Hearing normal.  Nose: Nose normal.  Mouth/Throat: Oropharynx is clear and moist and mucous membranes are normal.  Eyes: Conjunctivae and EOM are normal. Pupils are equal, round, and reactive to light.  Neck: Normal range of motion. Neck supple.  Cardiovascular: Regular rhythm, S1 normal and S2 normal.  Exam reveals no gallop and no friction rub.   No murmur heard. Pulmonary/Chest: Effort normal and breath sounds normal. No respiratory distress. She has no wheezes. She has no rales. She exhibits no tenderness.  Abdominal: Soft. Normal appearance and bowel sounds are  normal. There is no hepatosplenomegaly. There is no tenderness. There is no rebound, no guarding, no tenderness at McBurney's point and negative [name]'s sign. No hernia.  Musculoskeletal: Normal range of motion.  Neurological: She is alert and oriented to person, place, and time. She has normal strength. No cranial nerve deficit or sensory deficit. Coordination normal. GCS eye subscore is 4. GCS verbal subscore is 5. GCS motor subscore is 6.  Skin: Skin is warm, dry and intact. No cyanosis.  Large open wound with foul smelling discharge to area of R hip. Extensive erythema and induration surrounding wound.  Psychiatric: She has a normal mood and affect. Her speech is normal and behavior is normal. Thought  content normal.  Nursing note and vitals reviewed.        ED Course  Procedures (including critical care time) DIAGNOSTIC STUDIES: Oxygen Saturation is 100% on RA, normal by my interpretation.   COORDINATION OF CARE: 12:50 AM-Discussed next steps with pt including DG Chest, Blood Culture, UA, CMP, and CBC. Pt verbalized understanding and is agreeable with the plan.    Labs Review Labs Reviewed  COMPREHENSIVE METABOLIC PANEL - Abnormal; Notable for the following:    Chloride 100 (*)    Glucose, Bld 176 (*)    BUN <5 (*)    Calcium 8.5 (*)    Albumin 2.4 (*)    AST 11 (*)    ALT 10 (*)    All other components within normal limits  CBC WITH DIFFERENTIAL/PLATELET - Abnormal; Notable for the following:    WBC 12.6 (*)    RBC 3.78 (*)    Hemoglobin 11.0 (*)    HCT 35.5 (*)    Neutro Abs 9.7 (*)    Monocytes Absolute 1.1 (*)    All other components within normal limits  CULTURE, BLOOD (ROUTINE X 2)  CULTURE, BLOOD (ROUTINE X 2)  URINE CULTURE  URINALYSIS, ROUTINE W REFLEX MICROSCOPIC (NOT AT [facility])  I-STAT CG4 LACTIC ACID, ED    Imaging Review Dg Chest Port 1 View  05/09/2016  CLINICAL DATA:  Sepsis.  Shortness of breath. EXAM: PORTABLE CHEST 1 VIEW COMPARISON:   04/27/2016 FINDINGS: Mild cardiac enlargement without vascular congestion. No focal airspace disease or consolidation in the lungs. No blunting of costophrenic angles. No pneumothorax. Calcified and tortuous aorta. Degenerative changes in the shoulders. IMPRESSION: Cardiac enlargement.  No evidence of active pulmonary disease. Electronically Signed   By: Lucienne Capers M.D.   On: 05/09/2016 01:46     I have personally reviewed and evaluated these images and lab results as part of my medical decision-making.   EKG Interpretation   Date/Time:  Tuesday May 09 2016 00:44:41 EDT Ventricular Rate:  95 PR Interval:  145 QRS Duration: 112 QT Interval:  411 QTC Calculation: 517 R Axis:   8 Text Interpretation:  Sinus rhythm Borderline intraventricular conduction  delay Nonspecific T abnormalities, lateral leads Prolonged QT interval  Baseline wander in lead(s) V2 No significant change since last tracing  Confirmed by POLLINA  MD, CHRISTOPHER [phone]) on 05/09/2016 1:33:25 AM      MDM   Final diagnoses:  Cellulitis of right lower extremity  SIRS (systemic inflammatory response syndrome) ([place])    Patient presents to the emergency department for evaluation of infection of her right hip area. Patient reports that she did have a small injury to the right side of her hip 2 weeks ago. Patient does have a history of morbid obesity with large pannus. She reports that the area was pinched on a stretcher. In the last couple of days, however, the area has become swollen, red and painful. Examination reveals a small amount of purulent drainage, but no obvious fluctuance. There is a large open wound in the center of the area of infection that is bleeding and has some blood clot within it. Do not see any obvious drainable fluid collections or signs of significant abscess at this time.  Patient was noted to be febrile after she was brought back into the emergency department examination room. She also is  tachycardic. Blood drawn at time of triage reveals leukocytosis. With fever, obvious skin infection, leukocytosis, tachycardia, patient has felt to meet 2 or more  SIRS criteria. She tells me she has a history of sepsis. During treatment of her previous sepsis she became significantly volume overloaded. Based on this, fluids were initially withheld. Her lactate is 1.6, no cyanosis. Sepsis or septic shock. Blood pressure is hypertensive. She'll be given a small fluid bolus and Tylenol.  She does also have a history of adrenal insufficiency. Although she does not have hypotension at this point, will be given stress dose hydrocortisone empirically. She was empirically covered with Zosyn and vancomycin. Patient will require hospitalization for further management.  I personally performed the services described in this documentation, which was scribed in my presence. The recorded information has been reviewed and is accurate.     Orpah Greek, MD 05/09/16 [phone]

## 2016-05-09 NOTE — H&P (Addendum)
History and Physical    Kathy Simpson FFK:[id number] DOB: 1951-11-20 DOA: 05/08/2016  Referring MD/NP/PA:   PCP: Lillia Mountain, MD   Patient coming from:  The patient is coming from home.  At baseline, pt is independent for most of ADL.      Chief Complaint: right hip wound infection  HPI: Kathy Simpson is a 65 y.o. 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  Pt reports that her right hip was pinched by stretcher when she was transported to ED due to N/V 2 weeks ago. She developed a blister in right hip, which has worsened to form a big wound. She has discharge, bleeding, and erythema to the area. She denies tenderness, fever or chills. No nausea, vomiting. She has tried to apply topical Zinc Oxide on area w/ no relief. She states that she completed 5-day course of Amoxicillin for dental infection recently. Of note, she is currently on hydrocortisone  for her s/p of hx of Pitutary gland resection for a tumor in 1984.  She is taking hydrocortisone 20 mg in AM and 10 mg in PM currently. Patient does not have chest pain, cough, shortness of breath, symptoms of UTI or unilateral weakness.  ED Course: pt was found to have WBC 12.6, lactate of 1. 60-->2.17, temperature 100.0, tachycardia, no tachypnea, electrolytes renal function okay. Chest x-ray is negative for acute abnormalities. Pending urinalysis. Pt is placed on tele bed for obs.  Review of Systems:   General: no fevers, chills, no changes in body weight, has fatigue HEENT: no blurry vision, hearing changes or sore throat Pulm: no dyspnea, coughing, wheezing CV: no chest pain, no palpitations Abd: no nausea, vomiting, abdominal pain, diarrhea, constipation GU: no dysuria, burning on urination,  increased urinary frequency, hematuria  Ext: no leg edema Neuro: no unilateral weakness, numbness, or tingling, no vision change or hearing loss Skin: no rash. Has a big open wound over right hip. MSK: No muscle spasm, no deformity, no limitation of range of movement in spin Heme: No easy bruising.  Travel history: No recent long distant travel.  Allergy:  Allergies  Allergen Reactions  . Actos [Pioglitazone]   . Adhesive [Tape]   . Dye Fdc Red [Red Dye] Nausea And Vomiting  . Enalapril   . Livalo [Pitavastatin]   . Maxzide [Hydrochlorothiazide W-Triamterene]   . Metformin And Related   . Morphine And Related   . Statins Other (See Comments)    Makes legs numb   . Zetia [Ezetimibe]     Past Medical History  Diagnosis Date  . Hypertension   . Diabetes mellitus without complication (HCC)   . Obesity   . Hypothyroid   . Edema     Past Surgical History  Procedure Laterality Date  . Pituitary surgery  1984  . Back surgery    . Breast surgery      breast reduction  . Wrist surgery    . Knee surgery    . Tubal ligation      Social History:  reports that she has never smoked. She has never used smokeless tobacco. She reports that she does not drink alcohol or use illicit drugs.  Family History:  Family History  Problem Relation Age of Onset  . CAD Mother     s/p of CABG  .  Hypertension Mother   . Lung cancer Mother   . Lung cancer Father   . Bronchitis Sister      Prior to Admission medications   Medication Sig Start Date End Date Taking? Authorizing Provider  acetaminophen (TYLENOL) 500 MG tablet Take 1,500 mg by mouth every 6 (six) hours as needed for mild pain or headache.    Historical Provider, MD  Amino Acids-Protein Hydrolys (FEEDING SUPPLEMENT, PRO-STAT SUGAR FREE 64,) LIQD Take 30 mLs by mouth 2 (two) times daily with a meal. 09/24/14   Catarina Hartshorn, MD  aspirin EC 81 MG tablet Take 81 mg by mouth daily with breakfast.    Historical Provider, MD  carvedilol  (COREG) 6.25 MG tablet Take 1 tablet (6.25 mg total) by mouth 2 (two) times daily with a meal. 09/24/14   Catarina Hartshorn, MD  escitalopram (LEXAPRO) 20 MG tablet Take 20 mg by mouth daily.    Historical Provider, MD  furosemide (LASIX) 40 MG tablet Take 1 tablet (40 mg total) by mouth daily. 09/24/14   Catarina Hartshorn, MD  gabapentin (NEURONTIN) 100 MG capsule Take 100 mg by mouth 2 (two) times daily.     Historical Provider, MD  hydrocortisone (CORTEF) 10 MG tablet Take 15 mg by mouth daily with breakfast.     Historical Provider, MD  hydrocortisone (CORTEF) 20 MG tablet Take 20 mg by mouth every evening.     Historical Provider, MD  levothyroxine (SYNTHROID, LEVOTHROID) 175 MCG tablet Take 175 mcg by mouth daily before breakfast.    Historical Provider, MD  linagliptin (TRADJENTA) 5 MG TABS tablet Take 1 tablet (5 mg total) by mouth daily. 09/24/14   Catarina Hartshorn, MD  lisinopril (PRINIVIL,ZESTRIL) 20 MG tablet Take 1 tablet (20 mg total) by mouth daily. 09/24/14   Catarina Hartshorn, MD  liver oil-zinc oxide (DESITIN) 40 % ointment Apply 1 application topically as needed for irritation.    Historical Provider, MD  oxyCODONE-acetaminophen (PERCOCET/ROXICET) 5-325 MG per tablet Take 1 tablet by mouth every 4 (four) hours as needed for severe pain. 09/24/14   Catarina Hartshorn, MD  potassium chloride SA (K-DUR,KLOR-CON) 20 MEQ tablet Take 1 tablet (20 mEq total) by mouth daily. 09/24/14   Catarina Hartshorn, MD  risperiDONE (RISPERDAL) 1 MG tablet Take 1 mg by mouth at bedtime.    Historical Provider, MD  rOPINIRole (REQUIP) 1 MG tablet Take 1 mg by mouth 2 (two) times daily.    Historical Provider, MD  senna-docusate (SENOKOT-S) 8.6-50 MG per tablet Take 1 tablet by mouth 2 (two) times daily. 09/24/14   Catarina Hartshorn, MD  spironolactone (ALDACTONE) 25 MG tablet Take 1 tablet (25 mg total) by mouth daily. 09/24/14   Catarina Hartshorn, MD  URELLE (URELLE/URISED) 81 MG TABS tablet Take 1 tablet (81 mg total) by mouth every 6 (six) hours as needed for  bladder spasms. Stop after 09/26/14 doses 09/24/14   Catarina Hartshorn, MD    Physical Exam: Filed Vitals:   05/08/16 1612 05/08/16 2324 05/09/16 0035  BP: 140/84 178/62 155/77  Pulse: 103 101 108  Temp: 98.4 F (36.9 C)  100 F (37.8 C)  TempSrc: Oral  Oral  Resp: 18 19   SpO2: 97% 98% 100%   General: Not in acute distress HEENT:       Eyes: PERRL, EOMI, no scleral icterus.       ENT: No discharge from the ears and nose, no pharynx injection, no tonsillar enlargement.        Neck: No  JVD, no bruit, no mass felt. Heme: No neck lymph node enlargement. Cardiac: S1/S2, RRR, No murmurs, No gallops or rubs. Pulm:  No rales, wheezing, rhonchi or rubs. Abd: Soft, nondistended, nontender, no rebound pain, no organomegaly, BS present. GU: No hematuria Ext: No pitting leg edema bilaterally. 2+DP/PT pulse bilaterally. Musculoskeletal: No joint deformities, No joint redness or warmth, no limitation of ROM in spin. Skin: No rashes. There is a large open wound with foul smelling discharge to area of R hip. Extensive erythema and induration surrounding wound.  Neuro: Alert, oriented X3, cranial nerves II-XII grossly intact, moves all extremities normally. Psych: Patient is not psychotic, no suicidal or hemocidal ideation.  Labs on Admission: I have personally reviewed following labs and imaging studies  CBC:  Recent Labs Lab 05/08/16 1800  WBC 12.6*  NEUTROABS 9.7*  HGB 11.0*  HCT 35.5*  MCV 93.9  PLT 789   Basic Metabolic Panel:  Recent Labs Lab 05/08/16 1800  NA 137  K 4.3  CL 100*  CO2 28  GLUCOSE 176*  BUN <5*  CREATININE 0.55  CALCIUM 8.5*   GFR: CrCl cannot be calculated (Unknown ideal weight.). Liver Function Tests:  Recent Labs Lab 05/08/16 1800  AST 11*  ALT 10*  ALKPHOS 68  BILITOT 0.6  PROT 7.6  ALBUMIN 2.4*   No results for input(s): LIPASE, AMYLASE in the last 168 hours. No results for input(s): AMMONIA in the last 168 hours. Coagulation Profile: No  results for input(s): INR, PROTIME in the last 168 hours. Cardiac Enzymes: No results for input(s): CKTOTAL, CKMB, CKMBINDEX, TROPONINI in the last 168 hours. BNP (last 3 results) No results for input(s): PROBNP in the last 8760 hours. HbA1C: No results for input(s): HGBA1C in the last 72 hours. CBG: No results for input(s): GLUCAP in the last 168 hours. Lipid Profile: No results for input(s): CHOL, HDL, LDLCALC, TRIG, CHOLHDL, LDLDIRECT in the last 72 hours. Thyroid Function Tests: No results for input(s): TSH, T4TOTAL, FREET4, T3FREE, THYROIDAB in the last 72 hours. Anemia Panel: No results for input(s): VITAMINB12, FOLATE, FERRITIN, TIBC, IRON, RETICCTPCT in the last 72 hours. Urine analysis:    Component Value Date/Time   COLORURINE YELLOW 04/27/2016 2140   COLORURINE Straw 10/29/2014 0123   APPEARANCEUR CLEAR 04/27/2016 2140   APPEARANCEUR Hazy 10/29/2014 0123   LABSPEC 1.016 04/27/2016 2140   LABSPEC 1.002 10/29/2014 0123   PHURINE 6.0 04/27/2016 2140   PHURINE 8.0 10/29/2014 0123   GLUCOSEU >1000* 04/27/2016 2140   GLUCOSEU >=500 10/29/2014 0123   HGBUR TRACE* 04/27/2016 2140   HGBUR 1+ 10/29/2014 0123   BILIRUBINUR NEGATIVE 04/27/2016 2140   BILIRUBINUR Negative 10/29/2014 0123   KETONESUR 40* 04/27/2016 2140   KETONESUR Negative 10/29/2014 0123   PROTEINUR NEGATIVE 04/27/2016 2140   PROTEINUR 30 mg/dL 10/29/2014 0123   UROBILINOGEN 1.0 09/13/2014 1649   NITRITE NEGATIVE 04/27/2016 2140   NITRITE Negative 10/29/2014 0123   LEUKOCYTESUR SMALL* 04/27/2016 2140   LEUKOCYTESUR 2+ 10/29/2014 0123   Sepsis Labs: '@LABRCNTIP'$ (procalcitonin:4,lacticidven:4) )No results found for this or any previous visit (from the past 240 hour(s)).   Radiological Exams on Admission: Dg Chest Port 1 View  05/09/2016  CLINICAL DATA:  Sepsis.  Shortness of breath. EXAM: PORTABLE CHEST 1 VIEW COMPARISON:  04/27/2016 FINDINGS: Mild cardiac enlargement without vascular congestion. No focal  airspace disease or consolidation in the lungs. No blunting of costophrenic angles. No pneumothorax. Calcified and tortuous aorta. Degenerative changes in the shoulders. IMPRESSION: Cardiac enlargement.  No evidence  of active pulmonary disease. Electronically Signed   By: Lucienne Capers M.D.   On: 05/09/2016 01:46     EKG: Independently reviewed. Sinus rhythm, QTC 517, anteroseptal infarction pattern  Assessment/Plan Principal Problem:   Wound infection ([place]) Active Problems:   OSA (obstructive sleep apnea)   Chronic systolic CHF (congestive heart failure) (HCC)   Cellulitis   Hypertension   Diabetes mellitus without complication (HCC)   Obesity   Hypothyroid   Sepsis ([place])   Sepsis due to right hip wound infection and cellulitis: Patient is septic with elevated lactate 2.17, WBC 12.6, fever, tachycardia. Currently hemodynamically stable.  - will admit to tele bed - Empiric antimicrobial treatment with vancomycin and Zosyn per pharmacy - PRN Zofran for nausea, Percocet for pain - Blood cultures x 2  - ESR and CRP - wound care consult - will get Procalcitonin and trend lactic acid levels per sepsis protocol. - IVF: 1.5 L of NS bolus in ED, followed by 75 cc/h (patient has sCHF with EF 30%-35%, limiting aggressive IV fluids treatment). - Please consult general surgeon in morning  Addendum: BNP is elevated at 1328 in AM-->will d/c IVF now. F/u lactic acid level, if still elevated, will need to restart IVF.  OSA (obstructive sleep apnea): -refused CPAP  Chronic systolic CHF (congestive heart failure) ([place]): 2-D echo on 09/19/14 showed EF 30-35 percent. Patient is only taking spironolactone currently, not taking Lasix at home. No leg edema. CHF is compensated. -hold spironolactone due to sepsis -Continue aspirin and Coreg -Check BNP  HTN: -hold lisinopril and diuretics since patient is at risk of developing hypotension due to sepsis -IV hydralazine when necessary  DM-II:  Last A1c 8.4 on 10/29/14, poorly controled. 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  Disposition Plan:  Anticipate discharge back to previous home environment Consults called:  none Admission status: Obs / tele        Date of Service 05/09/2016    Ivor Costa [facility] Pager [phone]  If 7PM-7AM, please contact night-coverage [email or web address] Password [facility] 05/09/2016, 3:57 AM

## 2016-05-09 NOTE — Consult Note (Signed)
[facility] Consult Note  Kathy Simpson 02/29/1951  [id number].    Requesting MD: Xilin Niu, MD Chief Complaint/Reason for Consult: Right hip wound infection  HPI:  64 y.o. 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  Recently seen in ED on 04/27/16 for N/V. States at that time she was discharged with a diagnosis of constipation, however while in the ED her right hip was "clipped" by the side rail of the hospital bed, as she was not in a bariatric bed. Over 1 week later she noticed a blister in the area. 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 On Sunday 6/11 the patient noticed a "small hole" over the blistering area and experienced mild stinging pain. Monday 6/12 the patient decided to go to the doctor, as the wound was larger. The Nurse practitioner sent her to the ED for a surgical consult.   Patient denies any other history of injury or abscess in the area. Her hip pain is "stinging" In nature and non-radiating. Improved with tylenol. Denies fever, chills, dizziness, lightheadedness, palpitations, chest pain, SOB, abdominal pain, urinary symptoms including dysuria and hematuria, constipation. At baseline the patient ambulates with a walker.    Blood thinning medications: none Recent medications: Amoxicillin for a tooth extraction on 05/03/16    ROS: All systems reviewed and otherwise negative except for as above  Family History  Problem Relation Age of Onset  . CAD Mother     s/p of CABG  . Hypertension Mother   . Lung cancer Mother   . Lung cancer Father   . Bronchitis Sister     Past Medical History  Diagnosis Date  . Hypertension   . Diabetes  mellitus without complication (HCC)   . Obesity   . Hypothyroid   . Edema     Past Surgical History  Procedure Laterality Date  . Pituitary surgery  1984  . Back surgery    . Breast surgery      breast reduction  . Wrist surgery    . Knee surgery    . Tubal ligation      Social History:  reports that she has never smoked. She has never used smokeless tobacco. She reports that she does not drink alcohol or use illicit drugs.  Allergies:  Allergies  Allergen Reactions  . Metformin And Related Diarrhea  . Actos [Pioglitazone] Other (See Comments)    Congestive heart   . Adhesive [Tape] Other (See Comments)    Pulls off skin  . Dye Fdc Red [Red Dye] Nausea And Vomiting  . Enalapril Swelling  . Livalo [Pitavastatin] Other (See Comments)    Makes leg muscles weak   . Maxzide [Hydrochlorothiazide W-Triamterene] Swelling  . Morphine And Related Itching and Other (See Comments)    Mood changes  . Statins Other (See Comments)    Makes legs numb   . Zetia [Ezetimibe] Swelling  . Prostate [Nutritional Supplements] Hives and Rash     (Not in a hospital admission)  Blood pressure 130/59, pulse 84, temperature 98.6 F (37 C), temperature source Oral, resp. rate 24, SpO2 90 %. Physical Exam: General: pleasant, cooperative, morbidly obese female sitting up in bed,   her right hip elevated on a bedside stool. HEENT: head is normocephalic, atraumatic.  Sclera are noninjected.  Ears and nose without any masses or lesions. Heart: regular, rate, and rhythm.  No obvious murmurs, gallops, or rubs noted.  Palpable pedal pulses bilaterally. Femoral pulse 2+. Lungs: CTAB, no wheezes, rhonchi, or rales noted.  Respiratory effort nonlabored Abd: soft, NT/ND, +BS MS: all 4 extremities are symmetrical with no cyanosis, clubbing, or edema. No DM ulcers on plantar aspect of feet. Full sensation in BL LE. Strength 5/5 in BL LE. Skin: Right hip wound that is circumferential and 15-20 cm in diameter.  Central hematoma with fluctuance underneath. Sanguinous drainage, no purulence expressed. Probed into the wound >8cm. Surrounding induration and erythema.     Psych: A&Ox3 with an appropriate affect. Neuro: normal speech  Results for orders placed or performed during the hospital encounter of 05/08/16 (from the past 48 hour(s))  Comprehensive metabolic panel     Status: Abnormal   Collection Time: 05/08/16  6:00 PM  Result Value Ref Range   Sodium 137 135 - 145 mmol/L   Potassium 4.3 3.5 - 5.1 mmol/L   Chloride 100 (L) 101 - 111 mmol/L   CO2 28 22 - 32 mmol/L   Glucose, Bld 176 (H) 65 - 99 mg/dL   BUN <5 (L) 6 - 20 mg/dL   Creatinine, Ser 0.55 0.44 - 1.00 mg/dL   Calcium 8.5 (L) 8.9 - 10.3 mg/dL   Total Protein 7.6 6.5 - 8.1 g/dL   Albumin 2.4 (L) 3.5 - 5.0 g/dL   AST 11 (L) 15 - 41 U/L   ALT 10 (L) 14 - 54 U/L   Alkaline Phosphatase 68 38 - 126 U/L   Total Bilirubin 0.6 0.3 - 1.2 mg/dL   GFR calc non Af Amer >60 >60 mL/min   GFR calc Af Amer >60 >60 mL/min    Comment: (NOTE) The eGFR has been calculated using the CKD EPI equation. This calculation has not been validated in all clinical situations. eGFR's persistently <60 mL/min signify possible Chronic Kidney Disease.    Anion gap 9 5 - 15  CBC with Differential     Status: Abnormal   Collection Time: 05/08/16  6:00 PM  Result Value Ref Range   WBC 12.6 (H) 4.0 - 10.5 K/uL   RBC 3.78 (L) 3.87 - 5.11 MIL/uL   Hemoglobin 11.0 (L) 12.0 - 15.0 g/dL   HCT 35.5 (L) 36.0 - 46.0 %   MCV 93.9 78.0 - 100.0 fL   MCH 29.1 26.0 - 34.0 pg   MCHC 31.0 30.0 - 36.0 g/dL   RDW 14.7 11.5 - 15.5 %   Platelets 356 150 - 400 K/uL   Neutrophils Relative % 77 %   Neutro Abs 9.7 (H) 1.7 - 7.7 K/uL   Lymphocytes Relative 14 %   Lymphs Abs 1.8 0.7 - 4.0 K/uL   Monocytes Relative 8 %   Monocytes Absolute 1.1 (H) 0.1 - 1.0 K/uL   Eosinophils Relative 1 %   Eosinophils Absolute 0.1 0.0 - 0.7 K/uL   Basophils Relative 0 %   Basophils  Absolute 0.0 0.0 - 0.1 K/uL  Lactic acid, plasma     Status: None   Collection Time: 05/09/16 12:41 AM  Result Value Ref Range   Lactic Acid, Venous 1.1 0.5 - 2.0 mmol/L  I-Stat CG4 Lactic Acid, ED  (not at  [facility])     Status: None   Collection Time: 05/09/16  1:48 AM    Result Value Ref Range   Lactic Acid, Venous 1.60 0.5 - 2.0 mmol/L  I-Stat CG4 Lactic Acid, ED  (not at  [facility])     Status: Abnormal   Collection Time: 05/09/16  3:33 AM  Result Value Ref Range   Lactic Acid, Venous 2.17 (HH) 0.5 - 2.0 mmol/L   Comment NOTIFIED PHYSICIAN   Urinalysis, Routine w reflex microscopic (not at [facility])     Status: Abnormal   Collection Time: 05/09/16  4:37 AM  Result Value Ref Range   Color, Urine YELLOW YELLOW   APPearance CLEAR CLEAR   Specific Gravity, Urine 1.023 1.005 - 1.030   pH 6.5 5.0 - 8.0   Glucose, UA >1000 (A) NEGATIVE mg/dL   Hgb urine dipstick NEGATIVE NEGATIVE   Bilirubin Urine NEGATIVE NEGATIVE   Ketones, ur 15 (A) NEGATIVE mg/dL   Protein, ur NEGATIVE NEGATIVE mg/dL   Nitrite NEGATIVE NEGATIVE   Leukocytes, UA NEGATIVE NEGATIVE  Urine microscopic-add on     Status: Abnormal   Collection Time: 05/09/16  4:37 AM  Result Value Ref Range   Squamous Epithelial / LPF 0-5 (A) NONE SEEN   WBC, UA 0-5 0 - 5 WBC/hpf   RBC / HPF NONE SEEN 0 - 5 RBC/hpf   Bacteria, UA RARE (A) NONE SEEN  Brain natriuretic peptide     Status: Abnormal   Collection Time: 05/09/16  4:41 AM  Result Value Ref Range   B Natriuretic Peptide 1320.8 (H) 0.0 - 100.0 pg/mL  Cortisol-am, blood     Status: Abnormal   Collection Time: 05/09/16  4:41 AM  Result Value Ref Range   Cortisol - AM 94.6 (H) 6.7 - 22.6 ug/dL    Comment: RESULTS CONFIRMED BY MANUAL DILUTION  TSH     Status: Abnormal   Collection Time: 05/09/16  4:41 AM  Result Value Ref Range   TSH 0.054 (L) 0.350 - 4.500 uIU/mL  Procalcitonin     Status: None   Collection Time: 05/09/16  4:41 AM  Result Value Ref Range   Procalcitonin 7.92  ng/mL    Comment:        Interpretation: PCT > 2 ng/mL: Systemic infection (sepsis) is likely, unless other causes are known. (NOTE)         ICU PCT Algorithm               Non ICU PCT Algorithm    ----------------------------     ------------------------------         PCT < 0.25 ng/mL                 PCT < 0.1 ng/mL     Stopping of antibiotics            Stopping of antibiotics       strongly encouraged.               strongly encouraged.    ----------------------------     ------------------------------       PCT level decrease by               PCT < 0.25 ng/mL       >= 80% from peak PCT       OR PCT 0.25 - 0.5 ng/mL          Stopping of antibiotics  encouraged.     Stopping of antibiotics           encouraged.    ----------------------------     ------------------------------       PCT level decrease by              PCT >= 0.25 ng/mL       < 80% from peak PCT        AND PCT >= 0.5 ng/mL            Continuing antibiotics                                               encouraged.       Continuing antibiotics            encouraged.    ----------------------------     ------------------------------     PCT level increase compared          PCT > 0.5 ng/mL         with peak PCT AND          PCT >= 0.5 ng/mL             Escalation of antibiotics                                          strongly encouraged.      Escalation of antibiotics        strongly encouraged.   Protime-INR     Status: Abnormal   Collection Time: 05/09/16  4:41 AM  Result Value Ref Range   Prothrombin Time 15.5 (H) 11.6 - 15.2 seconds   INR 1.21 0.00 - 1.49  APTT     Status: Abnormal   Collection Time: 05/09/16  4:41 AM  Result Value Ref Range   aPTT 44 (H) 24 - 37 seconds    Comment:        IF BASELINE aPTT IS ELEVATED, SUGGEST PATIENT RISK ASSESSMENT BE USED TO DETERMINE APPROPRIATE ANTICOAGULANT THERAPY.   Basic metabolic panel     Status: Abnormal    Collection Time: 05/09/16  4:41 AM  Result Value Ref Range   Sodium 136 135 - 145 mmol/L   Potassium 3.3 (L) 3.5 - 5.1 mmol/L    Comment: DELTA CHECK NOTED   Chloride 101 101 - 111 mmol/L   CO2 24 22 - 32 mmol/L   Glucose, Bld 219 (H) 65 - 99 mg/dL   BUN <5 (L) 6 - 20 mg/dL   Creatinine, Ser 0.58 0.44 - 1.00 mg/dL   Calcium 8.1 (L) 8.9 - 10.3 mg/dL   GFR calc non Af Amer >60 >60 mL/min   GFR calc Af Amer >60 >60 mL/min    Comment: (NOTE) The eGFR has been calculated using the CKD EPI equation. This calculation has not been validated in all clinical situations. eGFR's persistently <60 mL/min signify possible Chronic Kidney Disease.    Anion gap 11 5 - 15  CBC     Status: Abnormal   Collection Time: 05/09/16  4:41 AM  Result Value Ref Range   WBC 8.5 4.0 - 10.5 K/uL   RBC 3.39 (L) 3.87 - 5.11 MIL/uL   Hemoglobin 10.0 (L) 12.0 - 15.0 g/dL   HCT 31.1 (L)  36.0 - 46.0 %   MCV 91.7 78.0 - 100.0 fL   MCH 29.5 26.0 - 34.0 pg   MCHC 32.2 30.0 - 36.0 g/dL   RDW 14.7 11.5 - 15.5 %   Platelets 349 150 - 400 K/uL  C-reactive protein     Status: Abnormal   Collection Time: 05/09/16  4:41 AM  Result Value Ref Range   CRP 41.4 (H) <1.0 mg/dL  Sedimentation rate     Status: Abnormal   Collection Time: 05/09/16  5:17 AM  Result Value Ref Range   Sed Rate >140 (H) 0 - 22 mm/hr  Lactic acid, plasma     Status: None   Collection Time: 05/09/16  7:52 AM  Result Value Ref Range   Lactic Acid, Venous 0.9 0.5 - 2.0 mmol/L  CBG monitoring, ED     Status: Abnormal   Collection Time: 05/09/16  8:15 AM  Result Value Ref Range   Glucose-Capillary 325 (H) 65 - 99 mg/dL  Lactic acid, plasma     Status: None   Collection Time: 05/09/16 11:31 AM  Result Value Ref Range   Lactic Acid, Venous 1.3 0.5 - 2.0 mmol/L   Dg Chest Port 1 View  05/09/2016  CLINICAL DATA:  Sepsis.  Shortness of breath. EXAM: PORTABLE CHEST 1 VIEW COMPARISON:  04/27/2016 FINDINGS: Mild cardiac enlargement without vascular  congestion. No focal airspace disease or consolidation in the lungs. No blunting of costophrenic angles. No pneumothorax. Calcified and tortuous aorta. Degenerative changes in the shoulders. IMPRESSION: Cardiac enlargement.  No evidence of active pulmonary disease. Electronically Signed   By: Lucienne Capers M.D.   On: 05/09/2016 01:46   Assessment/Plan Sepsis, suspect secondary to wound infection and cellulitis of Right Hip - Leukocytosis - 12.6; Lactic acid - 2.17 - blood cultures pending  - Day#1 Vanc/Zosyn - follow CBC and lactic acid  - elevated BNP, along with sCHF w/ EF30-35% limiting IVF; cardiology consulted for surgical clearance.  - Discuss plan for OR debridement with Dr. Hulen Skains   FEN: NPO after midnight, pain control, antiemetics DVT Proph: SCD's, Lovenox held  Dispo: per medicine  Chronic systolic CHF OSA HTN DM-II Hypothyroidism - levothyroxine Adrenal insufficiency - hydrocortisone BID   Jill Alexanders, [facility] 05/09/2016, 12:28 PM Pager: [phone] Mon-Fri 7:00 am-4:30 pm Sat-Sun 7:00 am-11:30 am

## 2016-05-09 NOTE — ED Notes (Signed)
Lunch tray at bedside. ?

## 2016-05-09 NOTE — Progress Notes (Signed)
PROGRESS NOTE    Kathy Simpson  [id number] DOB: 09/15/1951 DOA: 05/08/2016 PCP: [name], MD (Confirm with patient/family/NH records and if not entered, this HAS to be entered at [facility] point of entry. "No PCP" if truly none.)   Brief Narrative: (Start on day 1 of progress note - keep it brief and live)  Right hip wound infection with cellulitis, 65 yo female with obesity, T2DM and Hypopituitarism. Large skin lesion, consulted surgery for recommendations.    Assessment & Plan:   Principal Problem:   Wound infection ([place]) Active Problems:   OSA (obstructive sleep apnea)   Chronic systolic CHF (congestive heart failure) (HCC)   Cellulitis   Hypertension   Diabetes mellitus without complication ([place])   Obesity   Hypothyroid   Sepsis ([place])   1. Cardiovascular. Will continue IV fluids with saline at 75 cc/hr, will continue antibiotic therapy with IV vancomycin and zosyn. Follow on cultures, cell count and temp curve. Continue to hold on diuretic therapy, furosemide and aldactone.   2. Pulmonary. No signs of volume overload, will continue to monitor oxymetry, supplemental 02 as needed.  3. Nephrology. Renal function with cr at 0.58 with K at 3.3 and serum bicarb at 24. Will continue gentle hydration with saline and follow renal panel in am. Follow on vanc levels per pharmacy protocol.  4. Endocrinology. Will continue to follow and cover serum glucose with sliding scale insulin, serum glucose at  [phone]. At home not on antihyperglycemic agents. Continue levothyroxine and hydrocortisone.  5. Neurology. Continue ropinarole, escitalopram, gabapentin   6. dvt px  Patient is at moderate risk for worsening cellulitis   DVT prophylaxis: (Lovenox/Heparin/SCD's/anticoagulated/None (if comfort care) Code Status: (Full/Partial - specify details) Family Communication: (Specify name, relationship & date discussed. NO "discussed with patient") Disposition Plan: (specify when  and where you expect patient to be discharged). Include barriers to DC in this tab.   Consultants:   surgery  Procedures: (Don't include imaging studies which can be auto populated. Include things that cannot be auto populated i.e. Echo, Carotid and venous dopplers, Foley, Bipap, HD, tubes/drains, wound vac, central lines etc)    Antimicrobials: (specify start and planned stop date. Auto populated tables are space occupying and do not give end dates)  Vancomycin #0  Zosyn #0   Subjective: Patient is feeling better, positive pain at the site of the wound, no chest pain or dyspnea, no nausea or vomiting. Had worsening wound at home for the last approximate 7 days, with bleeding and worsening edema. Has been on high dose steroids for tooth extraction.   Objective: Filed Vitals:   05/09/16 0742 05/09/16 0900 05/09/16 0904 05/09/16 1000  BP:  132/63 132/63 150/77  Pulse:  95 91 98  Temp: 98.6 F (37 C)     TempSrc: Oral     Resp:  18 17 20   SpO2:  92% 94% 92%    Intake/Output Summary (Last 24 hours) at 05/09/16 1138 Last data filed at 05/09/16 0754  Gross per 24 hour  Intake      0 ml  Output   2850 ml  Net  -2850 ml   There were no vitals filed for this visit.  Examination:  General exam:  Not in pain or dyspnea E ENT: no pallor or icterus. Oral mucosa moist. Respiratory system: Clear to auscultation. Respiratory effort normal. Mild decrease breath sounds at bases due to poor inspiratory effort. Cardiovascular system: S1 & S2 heard, RRR. No JVD, murmurs, rubs, gallops or  clicks. Non pitting edema. Gastrointestinal system: Abdomen is nondistended, soft and nontender. No organomegaly or masses felt. Normal bowel sounds heard. Central nervous system: Alert and oriented. No focal neurological deficits. Extremities: Symmetric 5 x 5 power. Skin: right upper thigh with significant increase soft tissue with edematous mass about 5 to 8 cm diameter, with  oozing blood, no  purulence.  Psychiatry: Judgement and insight appear normal. Mood & affect appropriate.     Data Reviewed: I have personally reviewed following labs and imaging studies  CBC:  Recent Labs Lab 05/08/16 1800 05/09/16 0441  WBC 12.6* 8.5  NEUTROABS 9.7*  --   HGB 11.0* 10.0*  HCT 35.5* 31.1*  MCV 93.9 91.7  PLT 356 0000000   Basic Metabolic Panel:  Recent Labs Lab 05/08/16 1800 05/09/16 0441  NA 137 136  K 4.3 3.3*  CL 100* 101  CO2 28 24  GLUCOSE 176* 219*  BUN <5* <5*  CREATININE 0.55 0.58  CALCIUM 8.5* 8.1*   GFR: CrCl cannot be calculated (Unknown ideal weight.). Liver Function Tests:  Recent Labs Lab 05/08/16 1800  AST 11*  ALT 10*  ALKPHOS 68  BILITOT 0.6  PROT 7.6  ALBUMIN 2.4*   No results for input(s): LIPASE, AMYLASE in the last 168 hours. No results for input(s): AMMONIA in the last 168 hours. Coagulation Profile:  Recent Labs Lab 05/09/16 0441  INR 1.21   Cardiac Enzymes: No results for input(s): CKTOTAL, CKMB, CKMBINDEX, TROPONINI in the last 168 hours. BNP (last 3 results) No results for input(s): PROBNP in the last 8760 hours. HbA1C: No results for input(s): HGBA1C in the last 72 hours. CBG:  Recent Labs Lab 05/09/16 0815  GLUCAP 325*   Lipid Profile: No results for input(s): CHOL, HDL, LDLCALC, TRIG, CHOLHDL, LDLDIRECT in the last 72 hours. Thyroid Function Tests:  Recent Labs  05/09/16 0441  TSH 0.054*   Anemia Panel: No results for input(s): VITAMINB12, FOLATE, FERRITIN, TIBC, IRON, RETICCTPCT in the last 72 hours. Sepsis Labs:  Recent Labs Lab 05/09/16 0041 05/09/16 0148 05/09/16 0333 05/09/16 0441 05/09/16 0752  PROCALCITON  --   --   --  7.92  --   LATICACIDVEN 1.1 1.60 2.17*  --  0.9    No results found for this or any previous visit (from the past 240 hour(s)).       Radiology Studies: Dg Chest Port 1 View  05/09/2016  CLINICAL DATA:  Sepsis.  Shortness of breath. EXAM: PORTABLE CHEST 1 VIEW  COMPARISON:  04/27/2016 FINDINGS: Mild cardiac enlargement without vascular congestion. No focal airspace disease or consolidation in the lungs. No blunting of costophrenic angles. No pneumothorax. Calcified and tortuous aorta. Degenerative changes in the shoulders. IMPRESSION: Cardiac enlargement.  No evidence of active pulmonary disease. Electronically Signed   By: Lucienne Capers M.D.   On: 05/09/2016 01:46        Scheduled Meds: . aspirin EC  81 mg Oral Q breakfast  . escitalopram  20 mg Oral Daily  . gabapentin  600 mg Oral BID  . hydrocortisone  20 mg Oral BID WC  . insulin aspart  0-5 Units Subcutaneous QHS  . insulin aspart  0-9 Units Subcutaneous TID WC  . levothyroxine  175 mcg Oral QAC breakfast  . rOPINIRole  2 mg Oral BID  . sodium chloride flush  3 mL Intravenous Q12H   Continuous Infusions: . piperacillin-tazobactam 3.375 g (05/09/16 0956)  . vancomycin Stopped (05/09/16 0955)  Ein Rijo Gerome Apley, MD [facility] Pager [phone]  If 7PM-7AM, please contact night-coverage [email or web address] Password [facility] 05/09/2016, 11:38 AM

## 2016-05-10 ENCOUNTER — Encounter (HOSPITAL_COMMUNITY)
Admission: EM | Disposition: A | Discharge status: Home Health | Payer: Self-pay | Source: Home / Self Care | Attending: Internal Medicine

## 2016-05-10 ENCOUNTER — Observation Stay (HOSPITAL_COMMUNITY): Payer: Commercial Managed Care - HMO | Admitting: Anesthesiology

## 2016-05-10 ENCOUNTER — Encounter (HOSPITAL_COMMUNITY): Payer: Self-pay | Admitting: Anesthesiology

## 2016-05-10 DIAGNOSIS — B370 Candidal stomatitis: Secondary | ICD-10-CM | POA: Diagnosis present

## 2016-05-10 DIAGNOSIS — L03115 Cellulitis of right lower limb: Secondary | ICD-10-CM | POA: Diagnosis present

## 2016-05-10 DIAGNOSIS — A419 Sepsis, unspecified organism: Secondary | ICD-10-CM | POA: Diagnosis present

## 2016-05-10 DIAGNOSIS — L089 Local infection of the skin and subcutaneous tissue, unspecified: Secondary | ICD-10-CM | POA: Diagnosis not present

## 2016-05-10 DIAGNOSIS — G4733 Obstructive sleep apnea (adult) (pediatric): Secondary | ICD-10-CM | POA: Diagnosis present

## 2016-05-10 DIAGNOSIS — F329 Major depressive disorder, single episode, unspecified: Secondary | ICD-10-CM | POA: Diagnosis present

## 2016-05-10 DIAGNOSIS — E230 Hypopituitarism: Secondary | ICD-10-CM | POA: Diagnosis present

## 2016-05-10 DIAGNOSIS — Z6841 Body Mass Index (BMI) 40.0 and over, adult: Secondary | ICD-10-CM | POA: Diagnosis not present

## 2016-05-10 DIAGNOSIS — Z7952 Long term (current) use of systemic steroids: Secondary | ICD-10-CM | POA: Diagnosis not present

## 2016-05-10 DIAGNOSIS — E039 Hypothyroidism, unspecified: Secondary | ICD-10-CM | POA: Diagnosis present

## 2016-05-10 DIAGNOSIS — E876 Hypokalemia: Secondary | ICD-10-CM | POA: Diagnosis present

## 2016-05-10 DIAGNOSIS — M25551 Pain in right hip: Secondary | ICD-10-CM | POA: Diagnosis present

## 2016-05-10 DIAGNOSIS — Z7982 Long term (current) use of aspirin: Secondary | ICD-10-CM | POA: Diagnosis not present

## 2016-05-10 DIAGNOSIS — T148 Other injury of unspecified body region: Secondary | ICD-10-CM | POA: Diagnosis not present

## 2016-05-10 DIAGNOSIS — I5022 Chronic systolic (congestive) heart failure: Secondary | ICD-10-CM | POA: Diagnosis present

## 2016-05-10 DIAGNOSIS — E119 Type 2 diabetes mellitus without complications: Secondary | ICD-10-CM | POA: Diagnosis present

## 2016-05-10 DIAGNOSIS — I110 Hypertensive heart disease with heart failure: Secondary | ICD-10-CM | POA: Diagnosis present

## 2016-05-10 DIAGNOSIS — D649 Anemia, unspecified: Secondary | ICD-10-CM | POA: Diagnosis present

## 2016-05-10 DIAGNOSIS — I10 Essential (primary) hypertension: Secondary | ICD-10-CM | POA: Diagnosis not present

## 2016-05-10 DIAGNOSIS — Z79899 Other long term (current) drug therapy: Secondary | ICD-10-CM | POA: Diagnosis not present

## 2016-05-10 DIAGNOSIS — B958 Unspecified staphylococcus as the cause of diseases classified elsewhere: Secondary | ICD-10-CM | POA: Diagnosis present

## 2016-05-10 DIAGNOSIS — E2740 Unspecified adrenocortical insufficiency: Secondary | ICD-10-CM | POA: Diagnosis present

## 2016-05-10 HISTORY — PX: INCISION AND DRAINAGE OF WOUND: SHX1803

## 2016-05-10 LAB — BASIC METABOLIC PANEL
ANION GAP: 8 (ref 5–15)
BUN: <5 mg/dL — ABNORMAL LOW (ref 6–20)
CALCIUM: 7.9 mg/dL — AB (ref 8.9–10.3)
CO2: 27 mmol/L (ref 22–32)
CREATININE: 0.6 mg/dL (ref 0.44–1.00)
Chloride: 99 mmol/L — ABNORMAL LOW (ref 101–111)
GFR calc Af Amer: >60 mL/min (ref >60)
GLUCOSE: 268 mg/dL — AB (ref 65–99)
Potassium: 3.3 mmol/L — ABNORMAL LOW (ref 3.5–5.1)
Sodium: 134 mmol/L — ABNORMAL LOW (ref 135–145)

## 2016-05-10 LAB — SURGICAL PCR SCREEN
MRSA, PCR: NEGATIVE
STAPHYLOCOCCUS AUREUS: POSITIVE — AB

## 2016-05-10 LAB — GLUCOSE, CAPILLARY
GLUCOSE-CAPILLARY: 184 mg/dL — AB (ref 65–99)
GLUCOSE-CAPILLARY: 164 mg/dL — AB (ref 65–99)
GLUCOSE-CAPILLARY: 161 mg/dL — AB (ref 65–99)
Glucose-Capillary: 219 mg/dL — ABNORMAL HIGH (ref 65–99)
Glucose-Capillary: 236 mg/dL — ABNORMAL HIGH (ref 65–99)

## 2016-05-10 LAB — URINE CULTURE: Culture: NO GROWTH

## 2016-05-10 SURGERY — IRRIGATION AND DEBRIDEMENT WOUND
Anesthesia: General | Site: Hip | Laterality: Right

## 2016-05-10 MED ORDER — EMPAGLIFLOZIN 10 MG PO TABS: 10.0000 mg | ORAL_TABLET | Freq: Every day | ORAL | Status: DC

## 2016-05-10 MED ORDER — 0.9 % SODIUM CHLORIDE (POUR BTL) OPTIME
TOPICAL | Status: DC | PRN
  Administered 2016-05-10: 1000 mL

## 2016-05-10 MED ORDER — FENTANYL CITRATE (PF) 250 MCG/5ML IJ SOLN
INTRAMUSCULAR | Status: AC
  Filled 2016-05-10: qty 5

## 2016-05-10 MED ORDER — PROPOFOL 10 MG/ML IV BOLUS
INTRAVENOUS | Status: AC
  Filled 2016-05-10: qty 20

## 2016-05-10 MED ORDER — GABAPENTIN 300 MG PO CAPS
600.0000 mg | ORAL_CAPSULE | Freq: Two times a day (BID) | ORAL | Status: DC
  Administered 2016-05-10 – 2016-05-11 (×2): 600 mg via ORAL

## 2016-05-10 MED ORDER — SODIUM CHLORIDE 0.9 % IV SOLN
10.0000 mg | INTRAVENOUS | Status: DC | PRN
  Administered 2016-05-10: 25 ug/min via INTRAVENOUS

## 2016-05-10 MED ORDER — FENTANYL CITRATE (PF) 100 MCG/2ML IJ SOLN
INTRAMUSCULAR | Status: DC | PRN
  Administered 2016-05-10 (×2): 50 ug via INTRAVENOUS
  Administered 2016-05-10: 100 ug via INTRAVENOUS

## 2016-05-10 MED ORDER — LACTATED RINGERS IV SOLN
INTRAVENOUS | Status: DC | PRN
  Administered 2016-05-10: 10:00:00 via INTRAVENOUS

## 2016-05-10 MED ORDER — LIDOCAINE 2% (20 MG/ML) 5 ML SYRINGE
INTRAMUSCULAR | Status: DC | PRN
  Administered 2016-05-10: 60 mg via INTRAVENOUS

## 2016-05-10 MED ORDER — MIDAZOLAM HCL 2 MG/2ML IJ SOLN
INTRAMUSCULAR | Status: AC
  Filled 2016-05-10: qty 2

## 2016-05-10 MED ORDER — SUCCINYLCHOLINE CHLORIDE 200 MG/10ML IV SOSY
PREFILLED_SYRINGE | INTRAVENOUS | Status: AC
  Filled 2016-05-10: qty 10

## 2016-05-10 MED ORDER — MIDAZOLAM HCL 5 MG/5ML IJ SOLN
INTRAMUSCULAR | Status: DC | PRN
  Administered 2016-05-10: 2 mg via INTRAVENOUS

## 2016-05-10 MED ORDER — ONDANSETRON HCL 4 MG/2ML IJ SOLN
INTRAMUSCULAR | Status: AC
Start: 2016-05-10 — End: 2016-05-10
  Filled 2016-05-10: qty 2

## 2016-05-10 MED ORDER — HYDROMORPHONE HCL 1 MG/ML IJ SOLN: 0.2500 mg | INTRAMUSCULAR | Status: DC | PRN

## 2016-05-10 MED ORDER — SUCCINYLCHOLINE CHLORIDE 20 MG/ML IJ SOLN
INTRAMUSCULAR | Status: DC | PRN
  Administered 2016-05-10: 120 mg via INTRAVENOUS

## 2016-05-10 MED ORDER — PROPOFOL 10 MG/ML IV BOLUS
INTRAVENOUS | Status: DC | PRN
  Administered 2016-05-10: 40 mg via INTRAVENOUS

## 2016-05-10 SURGICAL SUPPLY — 57 items
APL SKNCLS STERI-STRIP NONHPOA (GAUZE/BANDAGES/DRESSINGS)
BENZOIN TINCTURE PRP APPL 2/3 (GAUZE/BANDAGES/DRESSINGS) IMPLANT
BLADE SURG 10 STRL SS (BLADE) ×2 IMPLANT
BLADE SURG 15 STRL LF DISP TIS (BLADE) IMPLANT
BLADE SURG 15 STRL SS (BLADE) ×3
BLADE SURG ROTATE 9660 (MISCELLANEOUS) IMPLANT
BNDG GAUZE ELAST 4 BULKY (GAUZE/BANDAGES/DRESSINGS) ×2 IMPLANT
CANISTER SUCTION 2500CC (MISCELLANEOUS) ×2 IMPLANT
CHLORAPREP W/TINT 26ML (MISCELLANEOUS) ×1 IMPLANT
CLEANER TIP ELECTROSURG 2X2 (MISCELLANEOUS) ×3 IMPLANT
COVER SURGICAL LIGHT HANDLE (MISCELLANEOUS) ×3 IMPLANT
DECANTER SPIKE VIAL GLASS SM (MISCELLANEOUS) IMPLANT
DRAPE LAPAROTOMY T 102X78X121 (DRAPES) ×1 IMPLANT
DRAPE UTILITY XL STRL (DRAPES) IMPLANT
ELECT REM PT RETURN 9FT ADLT (ELECTROSURGICAL) ×3
ELECTRODE REM PT RTRN 9FT ADLT (ELECTROSURGICAL) ×1 IMPLANT
GAUZE SPONGE 4X4 12PLY STRL (GAUZE/BANDAGES/DRESSINGS) ×3 IMPLANT
GLOVE BIO SURGEON STRL SZ 6.5 (GLOVE) ×2 IMPLANT
GLOVE BIO SURGEONS STRL SZ 6.5 (GLOVE) ×1
GLOVE BIOGEL PI IND STRL 6.5 (GLOVE) ×2 IMPLANT
GLOVE BIOGEL PI IND STRL 8 (GLOVE) ×1 IMPLANT
GLOVE BIOGEL PI INDICATOR 6.5 (GLOVE) ×4
GLOVE BIOGEL PI INDICATOR 8 (GLOVE) ×2
GLOVE ECLIPSE 7.5 STRL STRAW (GLOVE) ×3 IMPLANT
GOWN STRL REUS W/ TWL LRG LVL3 (GOWN DISPOSABLE) ×2 IMPLANT
GOWN STRL REUS W/TWL LRG LVL3 (GOWN DISPOSABLE) ×6
KIT BASIN OR (CUSTOM PROCEDURE TRAY) ×3 IMPLANT
KIT ROOM TURNOVER OR (KITS) ×3 IMPLANT
NEEDLE 22X1 1/2 (OR ONLY) (NEEDLE) IMPLANT
NS IRRIG 1000ML POUR BTL (IV SOLUTION) ×3 IMPLANT
PACK SURGICAL SETUP 50X90 (CUSTOM PROCEDURE TRAY) ×3 IMPLANT
PAD ABD 8X10 STRL (GAUZE/BANDAGES/DRESSINGS) ×3 IMPLANT
PAD ARMBOARD 7.5X6 YLW CONV (MISCELLANEOUS) ×4 IMPLANT
PEN SKIN MARKING BROAD (MISCELLANEOUS) ×2 IMPLANT
PENCIL BUTTON HOLSTER BLD 10FT (ELECTRODE) ×3 IMPLANT
SPECIMEN JAR SMALL (MISCELLANEOUS) IMPLANT
SPONGE GAUZE 4X4 12PLY STER LF (GAUZE/BANDAGES/DRESSINGS) ×2 IMPLANT
SPONGE LAP 18X18 X RAY DECT (DISPOSABLE) ×5 IMPLANT
SPONGE LAP 4X18 X RAY DECT (DISPOSABLE) ×1 IMPLANT
SUT ETHILON 2 0 FS 18 (SUTURE) IMPLANT
SUT VIC AB 2-0 CT1 27 (SUTURE)
SUT VIC AB 2-0 CT1 TAPERPNT 27 (SUTURE) IMPLANT
SUT VIC AB 3-0 SH 27 (SUTURE)
SUT VIC AB 3-0 SH 27X BRD (SUTURE) IMPLANT
SWAB COLLECTION DEVICE MRSA (MISCELLANEOUS) IMPLANT
SWAB CULTURE LIQUID MINI MALE (MISCELLANEOUS) ×3 IMPLANT
SYR BULB IRRIGATION 50ML (SYRINGE) ×3 IMPLANT
SYR CONTROL 10ML LL (SYRINGE) IMPLANT
TAPE CLOTH SURG 6X10 WHT LF (GAUZE/BANDAGES/DRESSINGS) ×2 IMPLANT
TOWEL OR 17X24 6PK STRL BLUE (TOWEL DISPOSABLE) ×5 IMPLANT
TOWEL OR 17X26 10 PK STRL BLUE (TOWEL DISPOSABLE) ×1 IMPLANT
TUBE ANAEROBIC SPECIMEN COL (MISCELLANEOUS) ×2 IMPLANT
TUBE CONNECTING 12'X1/4 (SUCTIONS) ×1
TUBE CONNECTING 12X1/4 (SUCTIONS) ×1 IMPLANT
UNDERPAD 30X30 INCONTINENT (UNDERPADS AND DIAPERS) ×1 IMPLANT
WATER STERILE IRR 1000ML POUR (IV SOLUTION) ×1 IMPLANT
YANKAUER SUCT BULB TIP NO VENT (SUCTIONS) ×3 IMPLANT

## 2016-05-10 NOTE — Interval H&P Note (Signed)
History and Physical Interval Note:  Massive right thigh/hip soft tissue hematoma and infection.for I&D today in the operating room.  Kathy Simpson. Dahlia Bailiff, MD, [place] [phone] [facility] 05/10/2016 9:38 AM  Kathy Simpson  has presented today for surgery, with the diagnosis of INFECTED RIGHT HIP AND THIGH  The various methods of treatment have been discussed with the patient and family. After consideration of risks, benefits and other options for treatment, the patient has consented to  Procedure(s): IRRIGATION AND DEBRIDEMENT WOUND (Right) as a surgical intervention .  The patient's history has been reviewed, patient examined, no change in status, stable for surgery.  I have reviewed the patient's chart and labs.  Questions were answered to the patient's satisfaction.     Yoshimi Sarr

## 2016-05-10 NOTE — Op Note (Signed)
OPERATIVE REPORT  DATE OF OPERATION:  05/10/2016  PATIENT:  Kathy Simpson  65 y.o. female  PRE-OPERATIVE DIAGNOSIS:  INFECTED RIGHT HIP AND THIGH  POST-OPERATIVE DIAGNOSIS:  INFECTED RIGHT HIP AND THIGH HEMATOMA  FINDINGS:  Large hematoma in an open right hip wound with some evidence of purulent drainage deep in the wound, no overt pus  PROCEDURE:  Procedure(s): IRRIGATION AND DEBRIDEMENT WOUND, RIGHT HIP/THIGH  SURGEON:  Surgeon(s): Judeth Horn, MD  ASSISTANT: None  ANESTHESIA:   general  COMPLICATIONS:  None  EBL: 75-100 ml  BLOOD ADMINISTERED: none  DRAINS: Urinary Catheter (Foley) and packed wound   SPECIMEN:  Source of Specimen:  drainage from the wund for aerobic and anaerobic cultures  COUNTS CORRECT:  YES  PROCEDURE DETAILS: The patient was taken to the operating room and placed on the table in supine position. After an adequate general endotracheal anesthetic was administered she was prepped and draped in usual sterile manner exposing this large wound on her right hip and thigh area.  A proper timeout was performed identifying the patient and the procedure to be performed. A picture was attached to this op report which shows a central portion of the wound which is mostly just all blood clot. On the deep aspect of this wound there was a small amount of purulent drainage but no overt large pockets of pus. Most of the tissue in this area was viable and a measured approximately 6 cm wide by 8 cm long by 6 cm deep. The necrotic margins of the wound were excised using electrocautery. Hemostasis was obtained using electrocautery primarily. Once there was adequate hemostasis we irrigated the wound with saline. We then packed it with a single roll of saline soaked Kerlix gauze followed by sterile dressing. All counts were correct. The patient will likely need a negative pressure wound dressing in the future, possibly on Friday.  All needle counts, sponge counts, and instrument  counts were correct.  PATIENT DISPOSITION:  PACU - hemodynamically stable.   Kathy Simpson 6/14/201710:41 AM

## 2016-05-10 NOTE — H&P (View-Only) (Signed)
[facility] Surgery Consult Note  DEAN WONDER [birth year]  [id number].    Requesting MD: Ivor Costa, MD Chief Complaint/Reason for Consult: Right hip wound infection  HPI:  65 y.o. 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  Recently seen in ED on 04/27/16 for N/V. States at that time she was discharged with a diagnosis of constipation, however while in the ED her right hip was "clipped" by the side rail of the hospital bed, as she was not in a bariatric bed. Over 1 week later she noticed a blister in the area. 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 On Sunday 6/11 the patient noticed a "small hole" over the blistering area and experienced mild stinging pain. Monday 6/12 the patient decided to go to the doctor, as the wound was larger. The Nurse practitioner sent her to the ED for a surgical consult.   Patient denies any other history of injury or abscess in the area. Her hip pain is "stinging" In nature and non-radiating. Improved with tylenol. Denies fever, chills, dizziness, lightheadedness, palpitations, chest pain, SOB, abdominal pain, urinary symptoms including dysuria and hematuria, constipation. At baseline the patient ambulates with a walker.    Blood thinning medications: none Recent medications: Amoxicillin for a tooth extraction on 05/03/16    ROS: All systems reviewed and otherwise negative except for as above  Family History  Problem Relation Age of Onset  . CAD Mother     s/p of CABG  . Hypertension Mother   . Lung cancer Mother   . Lung cancer Father   . Bronchitis Sister     Past Medical History  Diagnosis Date  . Hypertension   . Diabetes  mellitus without complication ([place])   . Obesity   . Hypothyroid   . Edema     Past Surgical History  Procedure Laterality Date  . Pituitary surgery  1984  . Back surgery    . Breast surgery      breast reduction  . Wrist surgery    . Knee surgery    . Tubal ligation      Social History:  reports that she has never smoked. She has never used smokeless tobacco. She reports that she does not drink alcohol or use illicit drugs.  Allergies:  Allergies  Allergen Reactions  . Metformin And Related Diarrhea  . Actos [Pioglitazone] Other (See Comments)    Congestive heart   . Adhesive [Tape] Other (See Comments)    Pulls off skin  . Dye Fdc Red [Red Dye] Nausea And Vomiting  . Enalapril Swelling  . Livalo [Pitavastatin] Other (See Comments)    Makes leg muscles weak   . Maxzide [Hydrochlorothiazide W-Triamterene] Swelling  . Morphine And Related Itching and Other (See Comments)    Mood changes  . Statins Other (See Comments)    Makes legs numb   . Zetia [Ezetimibe] Swelling  . Prostate [Nutritional Supplements] Hives and Rash     (Not in a hospital admission)  Blood pressure 130/59, pulse 84, temperature 98.6 F (37 C), temperature source Oral, resp. rate 24, SpO2 90 %. Physical Exam: General: pleasant, cooperative, morbidly obese female sitting up in bed,  her right hip elevated on a bedside stool. HEENT: head is normocephalic, atraumatic.  Sclera are noninjected.  Ears and nose without any masses or lesions. Heart: regular, rate, and rhythm.  No obvious murmurs, gallops, or rubs noted.  Palpable pedal pulses bilaterally. Femoral pulse 2+. Lungs: CTAB, no wheezes, rhonchi, or rales noted.  Respiratory effort nonlabored Abd: soft, NT/ND, +BS MS: all 4 extremities are symmetrical with no cyanosis, clubbing, or edema. No DM ulcers on plantar aspect of feet. Full sensation in BL LE. Strength 5/5 in BL LE. Skin: Right hip wound that is circumferential and 15-20 cm in diameter.  Central hematoma with fluctuance underneath. Sanguinous drainage, no purulence expressed. Probed into the wound >8cm. Surrounding induration and erythema.     Psych: A&Ox3 with an appropriate affect. Neuro: normal speech  Results for orders placed or performed during the hospital encounter of 05/08/16 (from the past 48 hour(s))  Comprehensive metabolic panel     Status: Abnormal   Collection Time: 05/08/16  6:00 PM  Result Value Ref Range   Sodium 137 135 - 145 mmol/L   Potassium 4.3 3.5 - 5.1 mmol/L   Chloride 100 (L) 101 - 111 mmol/L   CO2 28 22 - 32 mmol/L   Glucose, Bld 176 (H) 65 - 99 mg/dL   BUN <5 (L) 6 - 20 mg/dL   Creatinine, Ser 0.55 0.44 - 1.00 mg/dL   Calcium 8.5 (L) 8.9 - 10.3 mg/dL   Total Protein 7.6 6.5 - 8.1 g/dL   Albumin 2.4 (L) 3.5 - 5.0 g/dL   AST 11 (L) 15 - 41 U/L   ALT 10 (L) 14 - 54 U/L   Alkaline Phosphatase 68 38 - 126 U/L   Total Bilirubin 0.6 0.3 - 1.2 mg/dL   GFR calc non Af Amer >60 >60 mL/min   GFR calc Af Amer >60 >60 mL/min    Comment: (NOTE) The eGFR has been calculated using the CKD EPI equation. This calculation has not been validated in all clinical situations. eGFR's persistently <60 mL/min signify possible Chronic Kidney Disease.    Anion gap 9 5 - 15  CBC with Differential     Status: Abnormal   Collection Time: 05/08/16  6:00 PM  Result Value Ref Range   WBC 12.6 (H) 4.0 - 10.5 K/uL   RBC 3.78 (L) 3.87 - 5.11 MIL/uL   Hemoglobin 11.0 (L) 12.0 - 15.0 g/dL   HCT 35.5 (L) 36.0 - 46.0 %   MCV 93.9 78.0 - 100.0 fL   MCH 29.1 26.0 - 34.0 pg   MCHC 31.0 30.0 - 36.0 g/dL   RDW 14.7 11.5 - 15.5 %   Platelets 356 150 - 400 K/uL   Neutrophils Relative % 77 %   Neutro Abs 9.7 (H) 1.7 - 7.7 K/uL   Lymphocytes Relative 14 %   Lymphs Abs 1.8 0.7 - 4.0 K/uL   Monocytes Relative 8 %   Monocytes Absolute 1.1 (H) 0.1 - 1.0 K/uL   Eosinophils Relative 1 %   Eosinophils Absolute 0.1 0.0 - 0.7 K/uL   Basophils Relative 0 %   Basophils  Absolute 0.0 0.0 - 0.1 K/uL  Lactic acid, plasma     Status: None   Collection Time: 05/09/16 12:41 AM  Result Value Ref Range   Lactic Acid, Venous 1.1 0.5 - 2.0 mmol/L  I-Stat CG4 Lactic Acid, ED  (not at  [facility])     Status: None   Collection Time: 05/09/16  1:48 AM  Result Value Ref Range   Lactic Acid, Venous 1.60 0.5 - 2.0 mmol/L  I-Stat CG4 Lactic Acid, ED  (not at  [facility])     Status: Abnormal   Collection Time: 05/09/16  3:33 AM  Result Value Ref Range   Lactic Acid, Venous 2.17 (HH) 0.5 - 2.0 mmol/L   Comment NOTIFIED PHYSICIAN   Urinalysis, Routine w reflex microscopic (not at [facility])     Status: Abnormal   Collection Time: 05/09/16  4:37 AM  Result Value Ref Range   Color, Urine YELLOW YELLOW   APPearance CLEAR CLEAR   Specific Gravity, Urine 1.023 1.005 - 1.030   pH 6.5 5.0 - 8.0   Glucose, UA >1000 (A) NEGATIVE mg/dL   Hgb urine dipstick NEGATIVE NEGATIVE   Bilirubin Urine NEGATIVE NEGATIVE   Ketones, ur 15 (A) NEGATIVE mg/dL   Protein, ur NEGATIVE NEGATIVE mg/dL   Nitrite NEGATIVE NEGATIVE   Leukocytes, UA NEGATIVE NEGATIVE  Urine microscopic-add on     Status: Abnormal   Collection Time: 05/09/16  4:37 AM  Result Value Ref Range   Squamous Epithelial / LPF 0-5 (A) NONE SEEN   WBC, UA 0-5 0 - 5 WBC/hpf   RBC / HPF NONE SEEN 0 - 5 RBC/hpf   Bacteria, UA RARE (A) NONE SEEN  Brain natriuretic peptide     Status: Abnormal   Collection Time: 05/09/16  4:41 AM  Result Value Ref Range   B Natriuretic Peptide 1320.8 (H) 0.0 - 100.0 pg/mL  Cortisol-am, blood     Status: Abnormal   Collection Time: 05/09/16  4:41 AM  Result Value Ref Range   Cortisol - AM 94.6 (H) 6.7 - 22.6 ug/dL    Comment: RESULTS CONFIRMED BY MANUAL DILUTION  TSH     Status: Abnormal   Collection Time: 05/09/16  4:41 AM  Result Value Ref Range   TSH 0.054 (L) 0.350 - 4.500 uIU/mL  Procalcitonin     Status: None   Collection Time: 05/09/16  4:41 AM  Result Value Ref Range   Procalcitonin 7.92  ng/mL    Comment:        Interpretation: PCT > 2 ng/mL: Systemic infection (sepsis) is likely, unless other causes are known. (NOTE)         ICU PCT Algorithm               Non ICU PCT Algorithm    ----------------------------     ------------------------------         PCT < 0.25 ng/mL                 PCT < 0.1 ng/mL     Stopping of antibiotics            Stopping of antibiotics       strongly encouraged.               strongly encouraged.    ----------------------------     ------------------------------       PCT level decrease by               PCT < 0.25 ng/mL       >= 80% from peak PCT       OR PCT 0.25 - 0.5 ng/mL          Stopping of antibiotics  encouraged.     Stopping of antibiotics           encouraged.    ----------------------------     ------------------------------       PCT level decrease by              PCT >= 0.25 ng/mL       < 80% from peak PCT        AND PCT >= 0.5 ng/mL            Continuing antibiotics                                               encouraged.       Continuing antibiotics            encouraged.    ----------------------------     ------------------------------     PCT level increase compared          PCT > 0.5 ng/mL         with peak PCT AND          PCT >= 0.5 ng/mL             Escalation of antibiotics                                          strongly encouraged.      Escalation of antibiotics        strongly encouraged.   Protime-INR     Status: Abnormal   Collection Time: 05/09/16  4:41 AM  Result Value Ref Range   Prothrombin Time 15.5 (H) 11.6 - 15.2 seconds   INR 1.21 0.00 - 1.49  APTT     Status: Abnormal   Collection Time: 05/09/16  4:41 AM  Result Value Ref Range   aPTT 44 (H) 24 - 37 seconds    Comment:        IF BASELINE aPTT IS ELEVATED, SUGGEST PATIENT RISK ASSESSMENT BE USED TO DETERMINE APPROPRIATE ANTICOAGULANT THERAPY.   Basic metabolic panel     Status: Abnormal    Collection Time: 05/09/16  4:41 AM  Result Value Ref Range   Sodium 136 135 - 145 mmol/L   Potassium 3.3 (L) 3.5 - 5.1 mmol/L    Comment: DELTA CHECK NOTED   Chloride 101 101 - 111 mmol/L   CO2 24 22 - 32 mmol/L   Glucose, Bld 219 (H) 65 - 99 mg/dL   BUN <5 (L) 6 - 20 mg/dL   Creatinine, Ser 0.58 0.44 - 1.00 mg/dL   Calcium 8.1 (L) 8.9 - 10.3 mg/dL   GFR calc non Af Amer >60 >60 mL/min   GFR calc Af Amer >60 >60 mL/min    Comment: (NOTE) The eGFR has been calculated using the CKD EPI equation. This calculation has not been validated in all clinical situations. eGFR's persistently <60 mL/min signify possible Chronic Kidney Disease.    Anion gap 11 5 - 15  CBC     Status: Abnormal   Collection Time: 05/09/16  4:41 AM  Result Value Ref Range   WBC 8.5 4.0 - 10.5 K/uL   RBC 3.39 (L) 3.87 - 5.11 MIL/uL   Hemoglobin 10.0 (L) 12.0 - 15.0 g/dL   HCT 31.1 (L)  36.0 - 46.0 %   MCV 91.7 78.0 - 100.0 fL   MCH 29.5 26.0 - 34.0 pg   MCHC 32.2 30.0 - 36.0 g/dL   RDW 14.7 11.5 - 15.5 %   Platelets 349 150 - 400 K/uL  C-reactive protein     Status: Abnormal   Collection Time: 05/09/16  4:41 AM  Result Value Ref Range   CRP 41.4 (H) <1.0 mg/dL  Sedimentation rate     Status: Abnormal   Collection Time: 05/09/16  5:17 AM  Result Value Ref Range   Sed Rate >140 (H) 0 - 22 mm/hr  Lactic acid, plasma     Status: None   Collection Time: 05/09/16  7:52 AM  Result Value Ref Range   Lactic Acid, Venous 0.9 0.5 - 2.0 mmol/L  CBG monitoring, ED     Status: Abnormal   Collection Time: 05/09/16  8:15 AM  Result Value Ref Range   Glucose-Capillary 325 (H) 65 - 99 mg/dL  Lactic acid, plasma     Status: None   Collection Time: 05/09/16 11:31 AM  Result Value Ref Range   Lactic Acid, Venous 1.3 0.5 - 2.0 mmol/L   Dg Chest Port 1 View  05/09/2016  CLINICAL DATA:  Sepsis.  Shortness of breath. EXAM: PORTABLE CHEST 1 VIEW COMPARISON:  04/27/2016 FINDINGS: Mild cardiac enlargement without vascular  congestion. No focal airspace disease or consolidation in the lungs. No blunting of costophrenic angles. No pneumothorax. Calcified and tortuous aorta. Degenerative changes in the shoulders. IMPRESSION: Cardiac enlargement.  No evidence of active pulmonary disease. Electronically Signed   By: Lucienne Capers M.D.   On: 05/09/2016 01:46   Assessment/Plan Sepsis, suspect secondary to wound infection and cellulitis of Right Hip - Leukocytosis - 12.6; Lactic acid - 2.17 - blood cultures pending  - Day#1 Vanc/Zosyn - follow CBC and lactic acid  - elevated BNP, along with sCHF w/ EF30-35% limiting IVF; cardiology consulted for surgical clearance.  - Discuss plan for OR debridement with Dr. Hulen Skains   FEN: NPO after midnight, pain control, antiemetics DVT Proph: SCD's, Lovenox held  Dispo: per medicine  Chronic systolic CHF OSA HTN DM-II Hypothyroidism - levothyroxine Adrenal insufficiency - hydrocortisone BID   Jill Alexanders, Urology [facility] Surgery 05/09/2016, 12:28 PM Pager: [phone] Mon-Fri 7:00 am-4:30 pm Sat-Sun 7:00 am-11:30 am

## 2016-05-10 NOTE — Anesthesia Postprocedure Evaluation (Signed)
Anesthesia Post Note  Patient: Kathy Simpson  Procedure(s) Performed: Procedure(s) (LRB): IRRIGATION AND DEBRIDEMENT WOUND (Right)  Patient location during evaluation: PACU Anesthesia Type: General Level of consciousness: awake and alert Pain management: pain level controlled Vital Signs Assessment: post-procedure vital signs reviewed and stable Respiratory status: spontaneous breathing, nonlabored ventilation and respiratory function stable Cardiovascular status: blood pressure returned to baseline and stable Postop Assessment: no signs of nausea or vomiting Anesthetic complications: no    Last Vitals:  Filed Vitals:   05/10/16 1136 05/10/16 1140  BP: 162/59   Pulse: 97 94  Temp:  36.4 C  Resp: 14 13    Last Pain:  Filed Vitals:   05/10/16 1141  PainSc: Asleep                 Yannis Broce,W. EDMOND

## 2016-05-10 NOTE — Transfer of Care (Signed)
Immediate Anesthesia Transfer of Care Note  Patient: Kathy Simpson  Procedure(s) Performed: Procedure(s): IRRIGATION AND DEBRIDEMENT WOUND (Right)  Patient Location: PACU  Anesthesia Type:General  Level of Consciousness: awake, alert  and oriented  Airway & Oxygen Therapy: Patient Spontanous Breathing and Patient connected to face mask oxygen  Post-op Assessment: Report given to RN, Post -op Vital signs reviewed and stable and Patient moving all extremities X 4  Post vital signs: Reviewed and stable  Last Vitals:  Filed Vitals:   05/10/16 0522 05/10/16 1049  BP: 164/54   Pulse: 78 104  Temp: 36.8 C 36.9 C  Resp: 17     Last Pain:  Filed Vitals:   05/10/16 1051  PainSc: Asleep         Complications: No apparent anesthesia complications

## 2016-05-10 NOTE — Progress Notes (Signed)
Cardiology progress note:  TTE Report: Study Conclusions  - Left ventricle: The cavity size was normal. There was mild  concentric hypertrophy. Systolic function was normal. The  estimated ejection fraction was in the range of 50% to 55%. Wall  motion was normal; there were no regional wall motion  abnormalities. Doppler parameters are consistent with abnormal  left ventricular relaxation (grade 1 diastolic dysfunction). - Aortic valve: There was mild regurgitation. - Mitral valve: Calcified annulus. There was mild regurgitation. - Left atrium: The atrium was mildly dilated.  Impressions:  - EF is improved when compared to prior (30%). There is no significant valvular abnormality. There is no contraindication from cardiac standpoint for this patient to undergo her surgery.  We will sign off, call us with any questions.  Ena Dawley, MD 05/10/2016

## 2016-05-10 NOTE — Progress Notes (Signed)
PROGRESS NOTE    Kathy Simpson  [id number] DOB: Oct 11, 1951 DOA: 05/08/2016 PCP: [name], MD (Confirm with patient/family/NH records and if not entered, this HAS to be entered at [facility] point of entry. "No PCP" if truly none.)   Brief Narrative: (Start on day 1 of progress note - keep it brief and live) Right hip wound infection with cellulitis, 65 yo female with obesity, T2DM and Hypopituitarism. Large skin lesion, consulted surgery for recommendations   Assessment & Plan:   Principal Problem:   Wound infection ([place]) Active Problems:   OSA (obstructive sleep apnea)   Chronic systolic CHF (congestive heart failure) (HCC)   Cellulitis   Hypertension   Diabetes mellitus without complication ([place])   Obesity   Hypothyroid   Sepsis ([place])  1. Cardiovascular. Will continue IV fluids with saline at 75 cc/hr, will continue antibiotic therapy with IV vancomycin and zosyn. Patient s/p I and D follow on cultures, cell count and temp curve.    2. Pulmonary. No signs of volume overload, will continue to monitor oxymetry, supplemental 02 as needed. IV fluids at 75 cc.hr.  3. Nephrology. Continue iv fluids and follow renal  Panel in am, avoid hypotension or nephrotoxic meds.   4. Endocrinology. Will continue to follow and cover serum glucose with sliding scale insulin, serum glucose at 184-164-161. Will continue steroids and levothyroxine, no need for stress dose steroids at this point.   5. Neurology. Continue ropinarole, escitalopram, gabapentin   6. dvt px  Patient is at moderate risk for worsening cellulitis     DVT prophylaxis: (Lovenox/Heparin/SCD's/anticoagulated/None (if comfort care) Code Status: (Full/Partial - specify details) Family Communication: (Specify name, relationship & date discussed. NO "discussed with patient") Disposition Plan: (specify when and where you expect patient to be discharged). Include barriers to DC in this tab.   Consultants:    surgery  Procedures: (Don't include imaging studies which can be auto populated. Include things that cannot be auto populated i.e. Echo, Carotid and venous dopplers, Foley, Bipap, HD, tubes/drains, wound vac, central lines etc)  I & D of right hip wound   Antimicrobials: (specify start and planned stop date. Auto populated tables are space occupying and do not give end dates)   vancomycin zosyn  Subjective: Patient confused and dis-orienated post surgery, no significant pain, no nausea or vomiting. Most information from patient's family at the bedside.   Objective: Filed Vitals:   05/10/16 1106 05/10/16 1121 05/10/16 1136 05/10/16 1140  BP: 152/70 168/83 162/59   Pulse: 108 100 97 94  Temp:    97.6 F (36.4 C)  TempSrc:      Resp: 15 15 14 13   Height:      Weight:      SpO2: 96% 95% 94% 99%    Intake/Output Summary (Last 24 hours) at 05/10/16 1520 Last data filed at 05/10/16 1056  Gross per 24 hour  Intake   1203 ml  Output   4725 ml  Net  -3522 ml   Filed Weights   05/10/16 0522  Weight: 143.246 kg (315 lb 12.8 oz)    Examination:  General exam: not in pain or dyspnea E ENT: oral mucosa dry, no conjunctival pallor or icterus. Respiratory system: Clear to auscultation. Respiratory effort normal. Mild decrease breath sounds at bases, no wheezing, rales or rhonchi. Cardiovascular system: S1 & S2 heard, RRR. No JVD, murmurs, rubs, gallops or clicks. No pedal edema. Gastrointestinal system: Abdomen is nondistended, soft and nontender. No organomegaly or masses felt.  Normal bowel sounds heard. Central nervous system: Alert and oriented. No focal neurological deficits. Extremities: Symmetric 5 x 5 power. Skin: right hip region with post surgical dressing in place. Psychiatry: Judgement and insight appear normal. Mood & affect appropriate.     Data Reviewed: I have personally reviewed following labs and imaging studies  CBC:  Recent Labs Lab 05/08/16 1800  05/09/16 0441  WBC 12.6* 8.5  NEUTROABS 9.7*  --   HGB 11.0* 10.0*  HCT 35.5* 31.1*  MCV 93.9 91.7  PLT 356 0000000   Basic Metabolic Panel:  Recent Labs Lab 05/08/16 1800 05/09/16 0441  NA 137 136  K 4.3 3.3*  CL 100* 101  CO2 28 24  GLUCOSE 176* 219*  BUN <5* <5*  CREATININE 0.55 0.58  CALCIUM 8.5* 8.1*   GFR: Estimated Creatinine Clearance: 100.8 mL/min (by C-G formula based on Cr of 0.58). Liver Function Tests:  Recent Labs Lab 05/08/16 1800  AST 11*  ALT 10*  ALKPHOS 68  BILITOT 0.6  PROT 7.6  ALBUMIN 2.4*   No results for input(s): LIPASE, AMYLASE in the last 168 hours. No results for input(s): AMMONIA in the last 168 hours. Coagulation Profile:  Recent Labs Lab 05/09/16 0441  INR 1.21   Cardiac Enzymes: No results for input(s): CKTOTAL, CKMB, CKMBINDEX, TROPONINI in the last 168 hours. BNP (last 3 results) No results for input(s): PROBNP in the last 8760 hours. HbA1C: No results for input(s): HGBA1C in the last 72 hours. CBG:  Recent Labs Lab 05/09/16 1708 05/09/16 2140 05/10/16 0803 05/10/16 1109 05/10/16 1209  GLUCAP 232* 236* 184* 164* 161*   Lipid Profile: No results for input(s): CHOL, HDL, LDLCALC, TRIG, CHOLHDL, LDLDIRECT in the last 72 hours. Thyroid Function Tests:  Recent Labs  05/09/16 0441  TSH 0.054*   Anemia Panel: No results for input(s): VITAMINB12, FOLATE, FERRITIN, TIBC, IRON, RETICCTPCT in the last 72 hours. Sepsis Labs:  Recent Labs Lab 05/09/16 0148 05/09/16 0333 05/09/16 0441 05/09/16 0752 05/09/16 1131  PROCALCITON  --   --  7.92  --   --   LATICACIDVEN 1.60 2.17*  --  0.9 1.3    Recent Results (from the past 240 hour(s))  Urine culture     Status: None   Collection Time: 05/09/16  4:37 AM  Result Value Ref Range Status   Specimen Description URINE, CATHETERIZED  Final   Special Requests NONE  Final   Culture NO GROWTH  Final   Report Status 05/10/2016 FINAL  Final  Surgical pcr screen      Status: Abnormal   Collection Time: 05/10/16 12:30 AM  Result Value Ref Range Status   MRSA, PCR NEGATIVE NEGATIVE Final   Staphylococcus aureus POSITIVE (A) NEGATIVE Final    Comment:        The Xpert SA Assay (FDA approved for NASAL specimens in patients over 28 years of age), is one component of a comprehensive surveillance program.  Test performance has been validated by [facility] for patients greater than or equal to 33 year old. It is not intended to diagnose infection nor to guide or monitor treatment.   Aerobic Culture (superficial specimen)     Status: None (Preliminary result)   Collection Time: 05/10/16 10:29 AM  Result Value Ref Range Status   Specimen Description WOUND RIGHT THIGH  Final   Special Requests PATIENT ON FOLLOWING  VANCOMYCIN AND ZOSYN  Final   Gram Stain   Final    FEW WBC PRESENT,BOTH PMN  AND MONONUCLEAR NO ORGANISMS SEEN    Culture PENDING  Incomplete   Report Status PENDING  Incomplete         Radiology Studies: Dg Chest Port 1 View  05/09/2016  CLINICAL DATA:  Sepsis.  Shortness of breath. EXAM: PORTABLE CHEST 1 VIEW COMPARISON:  04/27/2016 FINDINGS: Mild cardiac enlargement without vascular congestion. No focal airspace disease or consolidation in the lungs. No blunting of costophrenic angles. No pneumothorax. Calcified and tortuous aorta. Degenerative changes in the shoulders. IMPRESSION: Cardiac enlargement.  No evidence of active pulmonary disease. Electronically Signed   By: Lucienne Capers M.D.   On: 05/09/2016 01:46        Scheduled Meds: . aspirin EC  81 mg Oral Q breakfast  . empagliflozin  10 mg Oral Daily  . escitalopram  20 mg Oral Daily  . furosemide  40 mg Intravenous BID  . gabapentin  600 mg Oral BID  . gabapentin  600 mg Oral BID  . hydrocortisone  20 mg Oral BID WC  . insulin aspart  0-5 Units Subcutaneous QHS  . insulin aspart  0-9 Units Subcutaneous TID WC  . levothyroxine  175 mcg Oral QAC breakfast  .  piperacillin-tazobactam  3.375 g Intravenous Q8H  . potassium chloride  20 mEq Oral Daily  . rOPINIRole  2 mg Oral BID  . sodium chloride flush  3 mL Intravenous Q12H  . vancomycin  1,250 mg Intravenous Q8H   Continuous Infusions: . sodium chloride Stopped (05/09/16 1430)            Jaylianna Tatlock Gerome Apley, MD [facility] Pager [phone]  If 7PM-7AM, please contact night-coverage [email or web address] Password [email or web address] 05/10/2016, 3:20 PM

## 2016-05-10 NOTE — Anesthesia Procedure Notes (Addendum)
Procedure Name: Intubation Date/Time: 05/10/2016 9:59 AM Performed by: Kyung Rudd Pre-anesthesia Checklist: Patient identified, Emergency Drugs available, Suction available, Patient being monitored and Timeout performed Patient Re-evaluated:Patient Re-evaluated prior to inductionOxygen Delivery Method: Circle system utilized Preoxygenation: Pre-oxygenation with 100% oxygen Intubation Type: IV induction Ventilation: Mask ventilation without difficulty Laryngoscope Size: Glidescope Grade View: Grade I Tube type: Oral Tube size: 7.5 mm Number of attempts: 1 Airway Equipment and Method: Stylet Placement Confirmation: ETT inserted through vocal cords under direct vision,  positive ETCO2 and breath sounds checked- equal and bilateral Secured at: 21 cm Tube secured with: Tape Dental Injury: Teeth and Oropharynx as per pre-operative assessment and Injury to tongue

## 2016-05-11 ENCOUNTER — Encounter (HOSPITAL_COMMUNITY): Payer: Self-pay | Admitting: General Surgery

## 2016-05-11 LAB — BASIC METABOLIC PANEL
ANION GAP: 10 (ref 5–15)
BUN: <5 mg/dL — ABNORMAL LOW (ref 6–20)
CALCIUM: 7.9 mg/dL — AB (ref 8.9–10.3)
CO2: 27 mmol/L (ref 22–32)
Chloride: 102 mmol/L (ref 101–111)
Creatinine, Ser: 0.48 mg/dL (ref 0.44–1.00)
GFR calc non Af Amer: >60 mL/min (ref >60)
Glucose, Bld: 185 mg/dL — ABNORMAL HIGH (ref 65–99)
Potassium: 3.2 mmol/L — ABNORMAL LOW (ref 3.5–5.1)
SODIUM: 139 mmol/L (ref 135–145)

## 2016-05-11 LAB — CBC WITH DIFFERENTIAL/PLATELET
BASOS ABS: 0 10*3/uL (ref 0.0–0.1)
Basophils Relative: 1 %
Eosinophils Absolute: 0.1 10*3/uL (ref 0.0–0.7)
Eosinophils Relative: 2 %
HEMATOCRIT: 28.2 % — AB (ref 36.0–46.0)
Hemoglobin: 8.5 g/dL — ABNORMAL LOW (ref 12.0–15.0)
LYMPHS ABS: 1.6 10*3/uL (ref 0.7–4.0)
LYMPHS PCT: 31 %
MCH: 28.5 pg (ref 26.0–34.0)
MCHC: 30.1 g/dL (ref 30.0–36.0)
MCV: 94.6 fL (ref 78.0–100.0)
MONO ABS: 0.5 10*3/uL (ref 0.1–1.0)
MONOS PCT: 11 %
NEUTROS ABS: 2.8 10*3/uL (ref 1.7–7.7)
Neutrophils Relative %: 55 %
Platelets: 334 10*3/uL (ref 150–400)
RBC: 2.98 MIL/uL — ABNORMAL LOW (ref 3.87–5.11)
RDW: 14.6 % (ref 11.5–15.5)
WBC: 5 10*3/uL (ref 4.0–10.5)

## 2016-05-11 LAB — GLUCOSE, CAPILLARY
GLUCOSE-CAPILLARY: 163 mg/dL — AB (ref 65–99)
GLUCOSE-CAPILLARY: 216 mg/dL — AB (ref 65–99)
Glucose-Capillary: 198 mg/dL — ABNORMAL HIGH (ref 65–99)
Glucose-Capillary: 315 mg/dL — ABNORMAL HIGH (ref 65–99)

## 2016-05-11 MED ORDER — CANAGLIFLOZIN 100 MG PO TABS
100.0000 mg | ORAL_TABLET | Freq: Every day | ORAL | Status: DC
  Filled 2016-05-11 (×5): qty 1

## 2016-05-11 MED ORDER — POTASSIUM CHLORIDE 20 MEQ PO PACK
40.0000 meq | PACK | Freq: Once | ORAL | Status: AC
  Administered 2016-05-11: 40 meq via ORAL
  Filled 2016-05-11: qty 2

## 2016-05-11 NOTE — Progress Notes (Signed)
PROGRESS NOTE    Kathy Simpson  [id number] DOB: [birth year] DOA: 05/08/2016 PCP: [name], MD (Confirm with patient/family/NH records and if not entered, this HAS to be entered at [facility] point of entry. "No PCP" if truly none.)   Brief Narrative: (Start on day 1 of progress note - keep it brief and live) Right hip wound infection with cellulitis, 65 yo female with obesity, T2DM and Hypopituitarism. Large skin lesion, consulted surgery for recommendations post I & D.   Assessment & Plan:   Principal Problem:   Wound infection ([place]) Active Problems:   OSA (obstructive sleep apnea)   Chronic systolic CHF (congestive heart failure) (HCC)   Cellulitis   Hypertension   Diabetes mellitus without complication ([place])   Obesity   Hypothyroid   Sepsis ([place])  1. Cardiovascular. Will continue IV fluids with saline at 75 cc/hr, will continue antibiotic therapy with IV vancomycin and zosyn.  Wbc at 5.0, t max 98. Will follow on blood cultures.   2. Pulmonary. Will continue to monitor oxymetry, supplemental 02 as needed. IV fluids at 75 cc.hr. No signs of volume overload.   3. Nephrology. Cr at 0.48 with k at 3.2, will replete K with kcl, follow renal pane in am avoid hypotension or nephrotoxic medications, follow vanc levels per pharmacy protocol.  4. Endocrinology. Will continue to follow and cover serum glucose with sliding scale insulin, serum glucose at [phone]. Will continue steroids and levothyroxine, per her home regimen, no signs of adrenal insufficiency.   5. Neurology. Continue ropinarole, escitalopram, gabapentin. Patient awake and alert.   6. dvt px  Patient is at moderate risk for worsening cellulitis    DVT prophylaxis: (Lovenox/Heparin/SCD's/anticoagulated/None (if comfort care) Code Status: (Full/Partial - specify details) Family Communication: (Specify name, relationship & date discussed. NO "discussed with patient") Disposition Plan: (specify when and  where you expect patient to be discharged). Include barriers to DC in this tab.   Consultants:   surgery  Procedures: (Don't include imaging studies which can be auto populated. Include things that cannot be auto populated i.e. Echo, Carotid and venous dopplers, Foley, Bipap, HD, tubes/drains, wound vac, central lines etc)    Antimicrobials: (specify start and planned stop date. Auto populated tables are space occupying and do not give end dates) Vancomycin Zosyn   Subjective: Patient feeling better, pain on the right side is improved, no radiation, worse to touch, improved with pain medications, no bleeding. Patient tolerating po well, no nausea or vomiting.  Objective: Filed Vitals:   05/10/16 1136 05/10/16 1140 05/10/16 2144 05/11/16 0543  BP: 162/59  150/48 132/53  Pulse: 97 94 80 73  Temp:  97.6 F (36.4 C) 98.6 F (37 C) 98.9 F (37.2 C)  TempSrc:    Oral  Resp: 14 13 20 18   Height:      Weight:      SpO2: 94% 99% 93% 98%    Intake/Output Summary (Last 24 hours) at 05/11/16 1252 Last data filed at 05/11/16 1115  Gross per 24 hour  Intake   1203 ml  Output   7950 ml  Net  -6747 ml   Filed Weights   05/10/16 0522  Weight: 143.246 kg (315 lb 12.8 oz)    Examination:  General exam: deconditioned E ENT: conjunctivae with mild pallor, oral mucosa dry. Respiratory system:  Respiratory effort normal. Decreased breath sounds at the dependent zones due to poor inspiratory effort, no rales or rhonchi. Cardiovascular system: S1 & S2 heard, RRR. No JVD,  murmurs, rubs, gallops or clicks. Positive non pitting edema ++. Gastrointestinal system: Abdomen is nondistended, soft and nontender. No organomegaly or masses felt. Normal bowel sounds heard. Central nervous system: Alert and oriented. No focal neurological deficits. Extremities: Symmetric 5 x 5 power. Skin: wound at the right hip with dressing in place, no rash beyond the dressing, no signs of bleeding Psychiatry:  Judgement and insight appear normal. Mood & affect appropriate.     Data Reviewed: I have personally reviewed following labs and imaging studies  CBC:  Recent Labs Lab 05/08/16 1800 05/09/16 0441 05/11/16 0642  WBC 12.6* 8.5 5.0  NEUTROABS 9.7*  --  2.8  HGB 11.0* 10.0* 8.5*  HCT 35.5* 31.1* 28.2*  MCV 93.9 91.7 94.6  PLT 356 349 [id number]   Basic Metabolic Panel:  Recent Labs Lab 05/08/16 1800 05/09/16 0441 05/10/16 1804 05/11/16 0642  NA 137 136 134* 139  K 4.3 3.3* 3.3* 3.2*  CL 100* 101 99* 102  CO2 28 24 27 27   GLUCOSE 176* 219* 268* 185*  BUN <5* <5* <5* <5*  CREATININE 0.55 0.58 0.60 0.48  CALCIUM 8.5* 8.1* 7.9* 7.9*   GFR: Estimated Creatinine Clearance: 100.8 mL/min (by C-G formula based on Cr of 0.48). Liver Function Tests:  Recent Labs Lab 05/08/16 1800  AST 11*  ALT 10*  ALKPHOS 68  BILITOT 0.6  PROT 7.6  ALBUMIN 2.4*   No results for input(s): LIPASE, AMYLASE in the last 168 hours. No results for input(s): AMMONIA in the last 168 hours. Coagulation Profile:  Recent Labs Lab 05/09/16 0441  INR 1.21   Cardiac Enzymes: No results for input(s): CKTOTAL, CKMB, CKMBINDEX, TROPONINI in the last 168 hours. BNP (last 3 results) No results for input(s): PROBNP in the last 8760 hours. HbA1C: No results for input(s): HGBA1C in the last 72 hours. CBG:  Recent Labs Lab 05/10/16 1209 05/10/16 1633 05/10/16 2142 05/11/16 0756 05/11/16 1221  GLUCAP 161* 219* 236* 163* 216*   Lipid Profile: No results for input(s): CHOL, HDL, LDLCALC, TRIG, CHOLHDL, LDLDIRECT in the last 72 hours. Thyroid Function Tests:  Recent Labs  05/09/16 0441  TSH 0.054*   Anemia Panel: No results for input(s): VITAMINB12, FOLATE, FERRITIN, TIBC, IRON, RETICCTPCT in the last 72 hours. Sepsis Labs:  Recent Labs Lab 05/09/16 0148 05/09/16 0333 05/09/16 0441 05/09/16 0752 05/09/16 1131  PROCALCITON  --   --  7.92  --   --   LATICACIDVEN 1.60 2.17*  --  0.9 1.3      Recent Results (from the past 240 hour(s))  Blood Culture (routine x 2)     Status: None (Preliminary result)   Collection Time: 05/09/16  1:30 AM  Result Value Ref Range Status   Specimen Description BLOOD RIGHT ARM  Final   Special Requests BOTTLES DRAWN AEROBIC AND ANAEROBIC 5ML  Final   Culture NO GROWTH 1 DAY  Final   Report Status PENDING  Incomplete  Blood Culture (routine x 2)     Status: None (Preliminary result)   Collection Time: 05/09/16  1:55 AM  Result Value Ref Range Status   Specimen Description BLOOD LEFT ARM  Final   Special Requests IN PEDIATRIC BOTTLE 2ML  Final   Culture NO GROWTH 1 DAY  Final   Report Status PENDING  Incomplete  Urine culture     Status: None   Collection Time: 05/09/16  4:37 AM  Result Value Ref Range Status   Specimen Description URINE, CATHETERIZED  Final  Special Requests NONE  Final   Culture NO GROWTH  Final   Report Status 05/10/2016 FINAL  Final  Surgical pcr screen     Status: Abnormal   Collection Time: 05/10/16 12:30 AM  Result Value Ref Range Status   MRSA, PCR NEGATIVE NEGATIVE Final   Staphylococcus aureus POSITIVE (A) NEGATIVE Final    Comment:        The Xpert SA Assay (FDA approved for NASAL specimens in patients over 63 years of age), is one component of a comprehensive surveillance program.  Test performance has been validated by [facility] for patients greater than or equal to 7 year old. It is not intended to diagnose infection nor to guide or monitor treatment.   Aerobic Culture (superficial specimen)     Status: None (Preliminary result)   Collection Time: 05/10/16 10:29 AM  Result Value Ref Range Status   Specimen Description WOUND RIGHT THIGH  Final   Special Requests PATIENT ON FOLLOWING  VANCOMYCIN AND ZOSYN  Final   Gram Stain   Final    FEW WBC PRESENT,BOTH PMN AND MONONUCLEAR NO ORGANISMS SEEN    Culture   Final    RARE STAPHYLOCOCCUS AUREUS CRITICAL RESULT CALLED TO, READ BACK BY AND  VERIFIED WITH: A MINTZ 05/11/16 @ 1214 M VESTAL CULTURE REINCUBATED FOR BETTER GROWTH    Report Status PENDING  Incomplete         Radiology Studies: No results found.      Scheduled Meds: . aspirin EC  81 mg Oral Q breakfast  . canagliflozin  100 mg Oral QAC breakfast  . escitalopram  20 mg Oral Daily  . gabapentin  600 mg Oral BID  . hydrocortisone  20 mg Oral BID WC  . insulin aspart  0-5 Units Subcutaneous QHS  . insulin aspart  0-9 Units Subcutaneous TID WC  . levothyroxine  175 mcg Oral QAC breakfast  . piperacillin-tazobactam  3.375 g Intravenous Q8H  . potassium chloride  40 mEq Oral Once  . rOPINIRole  2 mg Oral BID  . sodium chloride flush  3 mL Intravenous Q12H  . vancomycin  1,250 mg Intravenous Q8H   Continuous Infusions: . sodium chloride Stopped (05/09/16 1430)     LOS: 1 day      Mauricio Gerome Apley, MD [facility] Pager [phone]  If 7PM-7AM, please contact night-coverage [email or web address] Password [facility] 05/11/2016, 12:52 PM

## 2016-05-11 NOTE — Progress Notes (Signed)
OT Cancellation Note  Patient Details Name: Kathy Simpson MRN: [id number] DOB: 09/11/1951   Cancelled Treatment:    Reason Eval/Treat Not Completed: OT screened, no needs identified, will sign off. In to see pt and she is in the middle of finishing her lunch. Spoke to pt about her performing her basic ADLs and she does not feel there will be any issues in the way she has been doing them other than managing them with a wound vac that she is suppose to get tomorrow. She reports that if she does need any help that her husband will A her.  Almon Register [id number] 05/11/2016, 1:36 PM

## 2016-05-11 NOTE — Progress Notes (Signed)
Kathy Simpson Progress Note 1 Day Post-Op  Subjective: Per the nurses report the wound is clean and well vascularized.  Minimal amounts of medications needed to perform thedressing change at the bedside.  Will plan on performing dressing change at the bedside tomorrow.  Objective: Vital signs in last 24 hours: Temp:  [97.6 F (36.4 C)-98.9 F (37.2 C)] 98.9 F (37.2 C) (06/15 0543) Pulse Rate:  [73-108] 73 (06/15 0543) Resp:  [13-20] 18 (06/15 0543) BP: (121-168)/(48-83) 132/53 mmHg (06/15 0543) SpO2:  [93 %-100 %] 98 % (06/15 0543) Last BM Date: 05/10/16  Intake/Output from previous day: 06/14 0701 - 06/15 0700 In: 1563 [P.O.:360; I.V.:603; IV Piggyback:600] Out: [id number] [Urine:7950; Blood:25] Intake/Output this shift:    General: No distress  Lungs: Clear  Abd: Bening  Extremities: I did not visualize the wound today, but will place NPWD tomorrow with the wound care nurse.  Neuro: Intact  Lab Results:  @LABLAST2 (wbc:2,hgb:2,hct:2,plt:2) BMET ) Recent Labs  05/09/16 0441 05/10/16 1804  NA 136 134*  K 3.3* 3.3*  CL 101 99*  CO2 24 27  GLUCOSE 219* 268*  BUN <5* <5*  CREATININE 0.58 0.60  CALCIUM 8.1* 7.9*   PT/INR  Recent Labs  05/09/16 0441  LABPROT 15.5*  INR 1.21   ABG No results for input(s): PHART, HCO3 in the last 72 hours.  Invalid input(s): PCO2, PO2  Studies/Results: No results found.  Anti-infectives: Anti-infectives    Start     Dose/Rate Route Frequency Ordered Stop   05/09/16 1000  piperacillin-tazobactam (ZOSYN) IVPB 3.375 g     3.375 g 12.5 mL/hr over 240 Minutes Intravenous Every 8 hours 05/09/16 0351     05/09/16 0800  vancomycin (VANCOCIN) 1,250 mg in sodium chloride 0.9 % 250 mL IVPB     1,250 mg 166.7 mL/hr over 90 Minutes Intravenous Every 8 hours 05/09/16 0459     05/09/16 0800  piperacillin-tazobactam (ZOSYN) IVPB 3.375 g  Status:  Discontinued     3.375 g 12.5 mL/hr over 240 Minutes Intravenous Every 8 hours 05/09/16 0459  05/09/16 0459   05/09/16 0100  piperacillin-tazobactam (ZOSYN) IVPB 3.375 g     3.375 g 100 mL/hr over 30 Minutes Intravenous  Once 05/09/16 0052 05/09/16 0235   05/09/16 0100  vancomycin (VANCOCIN) IVPB 1000 mg/200 mL premix  Status:  Discontinued     1,000 mg 200 mL/hr over 60 Minutes Intravenous  Once 05/09/16 0052 05/09/16 0056   05/09/16 0100  vancomycin (VANCOCIN) 2,000 mg in sodium chloride 0.9 % 500 mL IVPB     2,000 mg 250 mL/hr over 120 Minutes Intravenous  Once 05/09/16 0056 05/09/16 0405      Assessment/Plan: s/p Procedure(s): IRRIGATION AND DEBRIDEMENT WOUND Continue ABX therapy due to Post-op infection until cultures come back negative. NPWD tomorrow.  LOS: 1 day   Kathryne Eriksson. Dahlia Bailiff, MD, FACS [phone] [facility] 05/11/2016

## 2016-05-11 NOTE — Consult Note (Signed)
   [facility] CM Inpatient Consult   05/11/2016  Kathy Simpson 1951-07-11 [id number]  Referral received to assess for care management services. Chart review reveals patient is is a 65 y.o. 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  Met with the patient at the bedside regarding the benefits of [place] [facility] services.  Patient verbalizes she has a great amount of support when she gets home with family.  She is a retired [employer] scientist and feels she could even manage her wound care needs between her sister [RN] , herself and other family members.  She expresses no needs for medication difficulties is receiving them by mail order.  She has handicap personal transportation and her home has been remodeled to be handicap accessible.  Explained that [place] [facility] is a covered benefit of insurance. Review information for [facility] Care Management and a folder was provided with contact information.  Explained that [place] [facility] does not interfere with or replace any services arranged by the inpatient care management staff.  Patient declined services with [place] [facility] but she is interested in the [facility] program water aerobics when she is able to participate in this.   Patient was given a brochure with contact information for follow up.  No further questions or needs expressed.  Thanks for the referral for questions, please contact:  Natividad Brood, RN BSN [facility] Liaison  [phone] business mobile phone Toll free office [phone]

## 2016-05-11 NOTE — Progress Notes (Signed)
Physical Therapy Treatment Patient Details Name: Kathy Simpson MRN: [id number] DOB: Mar 05, 1951 Today's Date: 05/11/2016    History of Present Illness Kathy Simpson is a 65 y.o. 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    PT Comments    Patient is progressing toward mobility goals. Ambulated 100 ft and overall min guard for OOB mobility. Fatigued with ambulation and required standing rest break. Current plan remains appropriate.   Follow Up Recommendations  No PT follow up     Equipment Recommendations  None recommended by PT    Recommendations for Other Services       Precautions / Restrictions Precautions Precautions: Fall Restrictions Weight Bearing Restrictions: No    Mobility  Bed Mobility Overal bed mobility: Needs Assistance Bed Mobility: Supine to Sit     Supine to sit: Supervision     General bed mobility comments: supervision for safety; increased time needed; HOB elevated  Transfers Overall transfer level: Needs assistance Equipment used: Rolling walker (2 wheeled) Transfers: Sit to/from Stand Sit to Stand: Min guard         General transfer comment: min guard for safety; cues for hand placement from EOB and BSC  Ambulation/Gait Ambulation/Gait assistance: Min guard Ambulation Distance (Feet): 100 Feet Assistive device: Rolling walker (2 wheeled) Gait Pattern/deviations: Step-through pattern;Decreased stride length;Trunk flexed     General Gait Details: slow, steady gait; cues for posture; increased time needed with one standing rest break   Stairs            Wheelchair Mobility    Modified Rankin (Stroke Patients Only)       Balance Overall balance assessment: Needs assistance Sitting-balance  support: No upper extremity supported;Feet supported Sitting balance-Leahy Scale: Fair     Standing balance support: Bilateral upper extremity supported Standing balance-Leahy Scale: Fair Standing balance comment: static standing                    Cognition Arousal/Alertness: Awake/alert Behavior During Therapy: WFL for tasks assessed/performed Overall Cognitive Status: Within Functional Limits for tasks assessed                      Exercises      General Comments        Pertinent Vitals/Pain Pain Assessment: Faces Faces Pain Scale: Hurts a little bit Pain Location: R hip with transitional movements Pain Descriptors / Indicators: Discomfort;Guarding;Sore Pain Intervention(s): Limited activity within patient's tolerance;Monitored during session;Premedicated before session;Repositioned    Home Living                      Prior Function            PT Goals (current goals can now be found in the care plan section) Acute Rehab PT Goals Patient Stated Goal: get better Progress towards PT goals: Progressing toward goals    Frequency  Min 3X/week    PT Plan Current plan remains appropriate    Co-evaluation             End of Session Equipment Utilized During Treatment: Gait belt Activity Tolerance: Patient tolerated treatment well Patient left: in chair;with call bell/phone within reach;with nursing/sitter in room     Time: 1445-1530 PT Time Calculation (min) (ACUTE ONLY): 45 min  Charges:  $  Gait Training: 8-22 mins $Therapeutic Activity: 23-37 mins                    G Codes:      [place] April, PTA Pager: [phone]   05/11/2016, 3:44 PM

## 2016-05-12 LAB — CBC WITH DIFFERENTIAL/PLATELET
BASOS ABS: 0 10*3/uL (ref 0.0–0.1)
Basophils Relative: 1 %
Eosinophils Absolute: 0.1 10*3/uL (ref 0.0–0.7)
Eosinophils Relative: 2 %
HEMATOCRIT: 29.8 % — AB (ref 36.0–46.0)
HEMOGLOBIN: 9 g/dL — AB (ref 12.0–15.0)
LYMPHS PCT: 31 %
Lymphs Abs: 1.9 10*3/uL (ref 0.7–4.0)
MCH: 28.2 pg (ref 26.0–34.0)
MCHC: 30.2 g/dL (ref 30.0–36.0)
MCV: 93.4 fL (ref 78.0–100.0)
MONO ABS: 0.6 10*3/uL (ref 0.1–1.0)
MONOS PCT: 10 %
NEUTROS ABS: 3.4 10*3/uL (ref 1.7–7.7)
NEUTROS PCT: 56 %
Platelets: 356 10*3/uL (ref 150–400)
RBC: 3.19 MIL/uL — ABNORMAL LOW (ref 3.87–5.11)
RDW: 14.6 % (ref 11.5–15.5)
WBC: 6 10*3/uL (ref 4.0–10.5)

## 2016-05-12 LAB — BASIC METABOLIC PANEL
ANION GAP: 9 (ref 5–15)
BUN: <5 mg/dL — ABNORMAL LOW (ref 6–20)
CALCIUM: 8.3 mg/dL — AB (ref 8.9–10.3)
CHLORIDE: 102 mmol/L (ref 101–111)
CO2: 28 mmol/L (ref 22–32)
Creatinine, Ser: 0.43 mg/dL — ABNORMAL LOW (ref 0.44–1.00)
GFR calc non Af Amer: >60 mL/min (ref >60)
GLUCOSE: 195 mg/dL — AB (ref 65–99)
Potassium: 3.3 mmol/L — ABNORMAL LOW (ref 3.5–5.1)
Sodium: 139 mmol/L (ref 135–145)

## 2016-05-12 LAB — VANCOMYCIN, TROUGH: VANCOMYCIN TR: 26 ug/mL — AB (ref 10.0–20.0)

## 2016-05-12 LAB — GLUCOSE, CAPILLARY
GLUCOSE-CAPILLARY: 269 mg/dL — AB (ref 65–99)
GLUCOSE-CAPILLARY: 198 mg/dL — AB (ref 65–99)
GLUCOSE-CAPILLARY: 159 mg/dL — AB (ref 65–99)
Glucose-Capillary: 187 mg/dL — ABNORMAL HIGH (ref 65–99)

## 2016-05-12 MED ORDER — NYSTATIN 100000 UNIT/GM EX POWD
Freq: Two times a day (BID) | CUTANEOUS | Status: DC
  Administered 2016-05-12 – 2016-05-15 (×5): via TOPICAL
  Filled 2016-05-12: qty 15

## 2016-05-12 MED ORDER — CEFAZOLIN SODIUM-DEXTROSE 2-4 GM/100ML-% IV SOLN
2.0000 g | Freq: Three times a day (TID) | INTRAVENOUS | Status: DC
  Administered 2016-05-12 – 2016-05-15 (×9): 2 g via INTRAVENOUS
  Filled 2016-05-12 (×11): qty 100

## 2016-05-12 MED ORDER — CEFAZOLIN (ANCEF) 1 G IV SOLR: 1.0000 g | Freq: Three times a day (TID) | INTRAVENOUS | Status: DC

## 2016-05-12 MED ORDER — POTASSIUM CHLORIDE 20 MEQ PO PACK
40.0000 meq | PACK | Freq: Once | ORAL | Status: DC
  Filled 2016-05-12: qty 2

## 2016-05-12 MED ORDER — POTASSIUM CHLORIDE CRYS ER 20 MEQ PO TBCR
40.0000 meq | EXTENDED_RELEASE_TABLET | Freq: Once | ORAL | Status: AC
  Administered 2016-05-12: 40 meq via ORAL
  Filled 2016-05-12: qty 2

## 2016-05-12 MED ORDER — VANCOMYCIN HCL IN DEXTROSE 750-5 MG/150ML-% IV SOLN
750.0000 mg | Freq: Three times a day (TID) | INTRAVENOUS | Status: DC
  Administered 2016-05-12: 750 mg via INTRAVENOUS
  Filled 2016-05-12 (×2): qty 150

## 2016-05-12 NOTE — Progress Notes (Signed)
Pharmacy Antibiotic Note  Kathy Simpson is a 65 y.o. female admitted on 05/08/2016 with hip cellulitis.  Pharmacy has been consulted for vancomycin dosing. Renal function is stable. VT is above goal at 26 (goal 15-20 mcg/ml).   6/16 PM: Adding cefazolin pending SA sensitivities  Plan: Decrease vancomycin to 750 mg IV q8h Cefazolin 2 grams iv Q 8 hours F/U Staph aureus sensitivities Monitor renal function  Height: 5\' 3"  (160 cm) Weight: (!) 319 lb 9.6 oz (144.97 kg) IBW/kg (Calculated) : 52.4  Temp (24hrs), Avg:98.4 F (36.9 C), Min:98.1 F (36.7 C), Max:98.5 F (36.9 C)   Recent Labs Lab 05/08/16 1800 05/09/16 0041 05/09/16 0148 05/09/16 0333 05/09/16 0441 05/09/16 0752 05/09/16 1131 05/10/16 1804 05/11/16 0642 05/12/16 0657 05/12/16 1303  WBC 12.6*  --   --   --  8.5  --   --   --  5.0 6.0  --   CREATININE 0.55  --   --   --  0.58  --   --  0.60 0.48 0.43*  --   LATICACIDVEN  --  1.1 1.60 2.17*  --  0.9 1.3  --   --   --   --   VANCOTROUGH  --   --   --   --   --   --   --   --   --   --  26*    Estimated Creatinine Clearance: 101.6 mL/min (by C-G formula based on Cr of 0.43).    Allergies  Allergen Reactions  . Metformin And Related Diarrhea  . Actos [Pioglitazone] Other (See Comments)    Congestive heart   . Adhesive [Tape] Other (See Comments)    Pulls off skin  . Dye Fdc Red [Red Dye] Nausea And Vomiting  . Enalapril Swelling  . Livalo [Pitavastatin] Other (See Comments)    Makes leg muscles weak   . Maxzide [Hydrochlorothiazide W-Triamterene] Swelling  . Morphine And Related Itching and Other (See Comments)    Mood changes  . Statins Other (See Comments)    Makes legs numb   . Zetia [Ezetimibe] Swelling  . Prostate [Nutritional Supplements] Hives and Rash    Antimicrobials this admission: Vancomycin 6/14 >>  Zosyn 6/14 >> 6/16 Cefazolin 6/16>  Dose adjustments this admission: 6/16 VT 26 on 1.25g q8h, decr to 750 mg q8h  Microbiology  results: 6/13 BC x 2: NGTD  6/14 Thigh wound: rare Staph aureus 6/13 Urine: NG  MRSA PCR +   Thank you for allowing pharmacy to be a part of this patient's care. Anette Guarneri, PharmD [phone] 05/12/2016 6:20 PM

## 2016-05-12 NOTE — Progress Notes (Signed)
PROGRESS NOTE    Kathy Simpson  [id number] DOB: 1951/10/11 DOA: 05/08/2016 PCP: [name], MD (Confirm with patient/family/NH records and if not entered, this HAS to be entered at [facility] point of entry. "No PCP" if truly none.)   Brief Narrative: (Start on day 1 of progress note - keep it brief and live) Right hip wound infection with cellulitis, 65 yo female with obesity, T2DM and Hypopituitarism. Large skin lesion, consulted surgery for recommendations post I & D. Wound vac placed.    Assessment & Plan:   Principal Problem:   Wound infection ([place]) Active Problems:   OSA (obstructive sleep apnea)   Chronic systolic CHF (congestive heart failure) (HCC)   Cellulitis   Hypertension   Diabetes mellitus without complication ([place])   Obesity   Hypothyroid   Sepsis ([place])  1. Cardiovascular. Will continue IV fluids with saline at 50 cc/hr, will continue antibiotic therapy with IV vancomycin. Wbc at 6.0, patient has been afebrile, pcr positive for staphylococcus mssa, will de-escalate antibiotic therapy.  2. Pulmonary. Will continue to monitor oxymetry, supplemental 02 as needed. No signs of volume overload, will decrease rate of IV fluids, follow a restrictive IV fluids approach.  3. Nephrology. Cr at 0.43 with k at 3.3, will replete K with kcl po, follow renal pane in am avoid hypotension or nephrotoxic medications, will de-escalate antibiotic therapy to avoid renal injury.  4. Endocrinology. Will continue to follow and cover serum glucose with sliding scale insulin, serum glucose [phone]. Will continue steroids and levothyroxine, per her home regimen, no signs of adrenal insufficiency. Patient not on insulin at home, will calculate insulin requirements over the next 24 hours to start long acting agent.   5. Neurology. Continue ropinarole, escitalopram, gabapentin. Patient awake and alert, no confusion noted today.   6. dvt px  Patient is at moderate risk for  worsening cellulitis    DVT prophylaxis: (Lovenox) Code Status: (Full/Partial - specify details) Family Communication: (Specify name, relationship & date discussed. NO "discussed with patient") Disposition Plan: (specify when and where you expect patient to be discharged). Include barriers to DC in this tab.   Consultants:   surgery  Procedures: (Don't include imaging studies which can be auto populated. Include things that cannot be auto populated i.e. Echo, Carotid and venous dopplers, Foley, Bipap, HD, tubes/drains, wound vac, central lines etc)  IRRIGATION AND DEBRIDEMENT WOUND, RIGHT HIP/THIGH  Wound vac to the right hip/ thigh wound  Antimicrobials: (specify start and planned stop date. Auto populated tables are space occupying and do not give end dates)  cefazolin   Subjective: Patient had wound vac placed to her right side, pain is controlled, no nausea or vomiting, no diarrhea. No chest pain or sob. Has been out of bed to the chair and ambulating to the restroom.  Objective: Filed Vitals:   05/11/16 0543 05/11/16 2147 05/11/16 2207 05/12/16 0615  BP: 132/53  135/57 151/71  Pulse: 73  85 78  Temp: 98.9 F (37.2 C)  98.5 F (36.9 C) 98.1 F (36.7 C)  TempSrc: Oral  Oral Oral  Resp: 18  20 20   Height:      Weight:  144.97 kg (319 lb 9.6 oz)    SpO2: 98%  94% 97%    Intake/Output Summary (Last 24 hours) at 05/12/16 1239 Last data filed at 05/12/16 1100  Gross per 24 hour  Intake   1650 ml  Output   8000 ml  Net  -6350 ml   [employer]  05/10/16 0522 05/11/16 2147  Weight: 143.246 kg (315 lb 12.8 oz) 144.97 kg (319 lb 9.6 oz)    Examination:  General exam: not in pain or dyspna E ENT: mild conjunctival pallor, oral mucosa moist. Respiratory system: Respiratory effort normal. Mild decrease breath sounds at bases due to poor inspiratory effort. Cardiovascular system: S1 & S2 heard, RRR. No JVD, murmurs, rubs, gallops or clicks. Trace lower extremities  edema. Gastrointestinal system: Abdomen is nondistended, soft and nontender. No organomegaly or masses felt. Normal bowel sounds heard. Central nervous system: Alert and oriented. No focal neurological deficits. Extremities: Symmetric 5 x 5 power. Skin: right hip region with wound vac in place, mild erythema.  Psychiatry: Judgement and insight appear normal. Mood & affect appropriate.     Data Reviewed: I have personally reviewed following labs and imaging studies  CBC:  Recent Labs Lab 05/08/16 1800 05/09/16 0441 05/11/16 0642 05/12/16 0657  WBC 12.6* 8.5 5.0 6.0  NEUTROABS 9.7*  --  2.8 3.4  HGB 11.0* 10.0* 8.5* 9.0*  HCT 35.5* 31.1* 28.2* 29.8*  MCV 93.9 91.7 94.6 93.4  PLT 356 349 334 [id number]   Basic Metabolic Panel:  Recent Labs Lab 05/08/16 1800 05/09/16 0441 05/10/16 1804 05/11/16 0642 05/12/16 0657  NA 137 136 134* 139 139  K 4.3 3.3* 3.3* 3.2* 3.3*  CL 100* 101 99* 102 102  CO2 28 24 27 27 28   GLUCOSE 176* 219* 268* 185* 195*  BUN <5* <5* <5* <5* <5*  CREATININE 0.55 0.58 0.60 0.48 0.43*  CALCIUM 8.5* 8.1* 7.9* 7.9* 8.3*   GFR: Estimated Creatinine Clearance: 101.6 mL/min (by C-G formula based on Cr of 0.43). Liver Function Tests:  Recent Labs Lab 05/08/16 1800  AST 11*  ALT 10*  ALKPHOS 68  BILITOT 0.6  PROT 7.6  ALBUMIN 2.4*   No results for input(s): LIPASE, AMYLASE in the last 168 hours. No results for input(s): AMMONIA in the last 168 hours. Coagulation Profile:  Recent Labs Lab 05/09/16 0441  INR 1.21   Cardiac Enzymes: No results for input(s): CKTOTAL, CKMB, CKMBINDEX, TROPONINI in the last 168 hours. BNP (last 3 results) No results for input(s): PROBNP in the last 8760 hours. HbA1C: No results for input(s): HGBA1C in the last 72 hours. CBG:  Recent Labs Lab 05/11/16 0756 05/11/16 1221 05/11/16 1724 05/11/16 2101 05/12/16 0747  GLUCAP 163* 216* 198* 315* 187*   Lipid Profile: No results for input(s): CHOL, HDL, LDLCALC,  TRIG, CHOLHDL, LDLDIRECT in the last 72 hours. Thyroid Function Tests: No results for input(s): TSH, T4TOTAL, FREET4, T3FREE, THYROIDAB in the last 72 hours. Anemia Panel: No results for input(s): VITAMINB12, FOLATE, FERRITIN, TIBC, IRON, RETICCTPCT in the last 72 hours. Sepsis Labs:  Recent Labs Lab 05/09/16 0148 05/09/16 0333 05/09/16 0441 05/09/16 0752 05/09/16 1131  PROCALCITON  --   --  7.92  --   --   LATICACIDVEN 1.60 2.17*  --  0.9 1.3    Recent Results (from the past 240 hour(s))  Blood Culture (routine x 2)     Status: None (Preliminary result)   Collection Time: 05/09/16  1:30 AM  Result Value Ref Range Status   Specimen Description BLOOD RIGHT ARM  Final   Special Requests BOTTLES DRAWN AEROBIC AND ANAEROBIC 5ML  Final   Culture NO GROWTH 2 DAYS  Final   Report Status PENDING  Incomplete  Blood Culture (routine x 2)     Status: None (Preliminary result)   Collection Time: 05/09/16  1:55 AM  Result Value Ref Range Status   Specimen Description BLOOD LEFT ARM  Final   Special Requests IN PEDIATRIC BOTTLE 2ML  Final   Culture NO GROWTH 2 DAYS  Final   Report Status PENDING  Incomplete  Urine culture     Status: None   Collection Time: 05/09/16  4:37 AM  Result Value Ref Range Status   Specimen Description URINE, CATHETERIZED  Final   Special Requests NONE  Final   Culture NO GROWTH  Final   Report Status 05/10/2016 FINAL  Final  Surgical pcr screen     Status: Abnormal   Collection Time: 05/10/16 12:30 AM  Result Value Ref Range Status   MRSA, PCR NEGATIVE NEGATIVE Final   Staphylococcus aureus POSITIVE (A) NEGATIVE Final    Comment:        The Xpert SA Assay (FDA approved for NASAL specimens in patients over 30 years of age), is one component of a comprehensive surveillance program.  Test performance has been validated by [facility] for patients greater than or equal to 92 year old. It is not intended to diagnose infection nor to guide or monitor  treatment.   Anaerobic culture     Status: None (Preliminary result)   Collection Time: 05/10/16 10:29 AM  Result Value Ref Range Status   Specimen Description WOUND RIGHT THIGH  Final   Special Requests PATIENT ON FOLLOWING VANCOMYCIN AND ZOSYN  Final   Gram Stain   Final    FEW WBC PRESENT,BOTH PMN AND MONONUCLEAR NO ORGANISMS SEEN    Culture   Final    NO ANAEROBES ISOLATED; CULTURE IN PROGRESS FOR 5 DAYS   Report Status PENDING  Incomplete  Aerobic Culture (superficial specimen)     Status: None (Preliminary result)   Collection Time: 05/10/16 10:29 AM  Result Value Ref Range Status   Specimen Description WOUND RIGHT THIGH  Final   Special Requests PATIENT ON FOLLOWING  VANCOMYCIN AND ZOSYN  Final   Gram Stain   Final    FEW WBC PRESENT,BOTH PMN AND MONONUCLEAR NO ORGANISMS SEEN    Culture   Final    RARE STAPHYLOCOCCUS AUREUS CRITICAL RESULT CALLED TO, READ BACK BY AND VERIFIED WITH: A MINTZ 05/11/16 @ 1214 M VESTAL CULTURE REINCUBATED FOR BETTER GROWTH    Report Status PENDING  Incomplete         Radiology Studies: No results found.      Scheduled Meds: . aspirin EC  81 mg Oral Q breakfast  . canagliflozin  100 mg Oral QAC breakfast  . escitalopram  20 mg Oral Daily  . gabapentin  600 mg Oral BID  . hydrocortisone  20 mg Oral BID WC  . insulin aspart  0-5 Units Subcutaneous QHS  . insulin aspart  0-9 Units Subcutaneous TID WC  . levothyroxine  175 mcg Oral QAC breakfast  . potassium chloride  40 mEq Oral Once  . rOPINIRole  2 mg Oral BID  . sodium chloride flush  3 mL Intravenous Q12H  . vancomycin  1,250 mg Intravenous Q8H   Continuous Infusions: . sodium chloride Stopped (05/09/16 1430)     LOS: 2 days        Mauricio Gerome Apley, MD [facility] Pager [phone]  If 7PM-7AM, please contact night-coverage [email or web address] Password [facility] 05/12/2016, 12:39 PM

## 2016-05-12 NOTE — Progress Notes (Signed)
PT Cancellation Note  Patient Details Name: Kathy Simpson MRN: [id number] DOB: 1951/08/30   Cancelled Treatment:    Reason Eval/Treat Not Completed: Patient declined, no reason specifiedPt reported she wants to wait until after lunch due to having an busy morning getting wound vac and is "loopy" from pain meds. PT will check on pt later as time allows.    [place] April, PTA Pager: [phone]   05/12/2016, 12:03 PM

## 2016-05-12 NOTE — Progress Notes (Signed)
Inpatient Diabetes Program Recommendations  AACE/ADA: New Consensus Statement on Inpatient Glycemic Control (2015)  Target Ranges:  Prepandial:   less than 140 mg/dL      Peak postprandial:   less than 180 mg/dL (1-2 hours)      Critically ill patients:  140 - 180 mg/dL   Results for DAMIA, LAMUNYON (MRN [place]:[id number]) as of 05/12/2016 10:33  Ref. Range 05/11/2016 07:56 05/11/2016 12:21 05/11/2016 17:24 05/11/2016 21:01 05/12/2016 07:47  Glucose-Capillary Latest Ref Range: 65-99 mg/dL 163 (H) 216 (H) 198 (H) 315 (H) 187 (H)   Review of Glycemic Control   Current orders for Inpatient glycemic control: Invokana 100 mg daily, Novolog 0-9 units TID with meals, Novolog 0-5 units QHS  Inpatient Diabetes Program Recommendations: Insulin - Meal Coverage: If steroids are continued as ordered, please consider ordering Novolog 3 units TID with meals if patient eats at least 50% of meals.   Thanks, Barnie Alderman, RN, MSN, CDE Diabetes Coordinator Inpatient Diabetes Program [phone] (Team Pager from [place] to [place]) [phone] ([facility] office) [phone] [facility] office) [phone] [facility] office)

## 2016-05-12 NOTE — Progress Notes (Signed)
CCS/Yomaris Palecek Progress Note 2 Days Post-Op  Subjective: Rare staph aureus growing from the wound.  The wound is a bit dusky on the inferior and lateral ascpect, but viable on the inside.  Melody from wound care to apply NPWD at the bedside.  To be changed again on MOnday.  Objective: Vital signs in last 24 hours: Temp:  [98.1 F (36.7 C)-98.5 F (36.9 C)] 98.1 F (36.7 C) (06/16 0615) Pulse Rate:  [78-85] 78 (06/16 0615) Resp:  [20] 20 (06/16 0615) BP: (135-151)/(57-71) 151/71 mmHg (06/16 0615) SpO2:  [94 %-97 %] 97 % (06/16 0615) Weight:  [144.97 kg (319 lb 9.6 oz)] 144.97 kg (319 lb 9.6 oz) (06/15 2147) Last BM Date: 05/10/16  Intake/Output from previous day: 06/15 0701 - 06/16 0700 In: 1390 [P.O.:1100; I.V.:40; IV Piggyback:250] Out: 8200 [Urine:8200] Intake/Output this shift:    General: No acute distress.  Staph aureus from the wound   Lungs: Clear  Abd: Benign  Extremities: large open wound with the attached picture    Neuro: Intact.  Lab Results:  @LABLAST2 (wbc:2,hgb:2,hct:2,plt:2) BMET ) Recent Labs  05/10/16 1804 05/11/16 0642  NA 134* 139  K 3.3* 3.2*  CL 99* 102  CO2 27 27  GLUCOSE 268* 185*  BUN <5* <5*  CREATININE 0.60 0.48  CALCIUM 7.9* 7.9*   PT/INR No results for input(s): LABPROT, INR in the last 72 hours. ABG No results for input(s): PHART, HCO3 in the last 72 hours.  Invalid input(s): PCO2, PO2  Studies/Results: No results found.  Anti-infectives: Anti-infectives    Start     Dose/Rate Route Frequency Ordered Stop   05/09/16 1000  piperacillin-tazobactam (ZOSYN) IVPB 3.375 g     3.375 g 12.5 mL/hr over 240 Minutes Intravenous Every 8 hours 05/09/16 0351     05/09/16 0800  vancomycin (VANCOCIN) 1,250 mg in sodium chloride 0.9 % 250 mL IVPB     1,250 mg 166.7 mL/hr over 90 Minutes Intravenous Every 8 hours 05/09/16 0459     05/09/16 0800  piperacillin-tazobactam (ZOSYN) IVPB 3.375 g  Status:  Discontinued     3.375 g 12.5 mL/hr  over 240 Minutes Intravenous Every 8 hours 05/09/16 0459 05/09/16 0459   05/09/16 0100  piperacillin-tazobactam (ZOSYN) IVPB 3.375 g     3.375 g 100 mL/hr over 30 Minutes Intravenous  Once 05/09/16 0052 05/09/16 0235   05/09/16 0100  vancomycin (VANCOCIN) IVPB 1000 mg/200 mL premix  Status:  Discontinued     1,000 mg 200 mL/hr over 60 Minutes Intravenous  Once 05/09/16 0052 05/09/16 0056   05/09/16 0100  vancomycin (VANCOCIN) 2,000 mg in sodium chloride 0.9 % 500 mL IVPB     2,000 mg 250 mL/hr over 120 Minutes Intravenous  Once 05/09/16 0056 05/09/16 0405      Assessment/Plan: s/p Procedure(s): IRRIGATION AND DEBRIDEMENT WOUND Once the sensitivities have returned we can change antibiotics   Will stop Zosyn now.  LOS: 2 days   Kathryne Eriksson. Dahlia Bailiff, MD, FACS [phone] [facility] 05/12/2016

## 2016-05-12 NOTE — Consult Note (Signed)
WOC wound consult note Reason for Consult:  Placement NPWT right hip wound Wound type: surgical  Pressure Ulcer POA: No Measurement: 8cm x 8cm x 3.0cm x 6cm tunnel at 4 o'clock  Wound bed: clean, subcutaneous tissue. Dark tissue from [place] intraoperatively  Drainage (amount, consistency, odor) moderate, serosanguinous  Periwound: indurated with erythema, Dr. Hulen Skains at bedside to assess Dressing procedure/placement/frequency: 1pc of white foam used in the tunneled area, covered with 1pc of black foam to fill wound bed. Drape and sealed at 141mmHG.  Patient tolerated without problems.  WOC will plan to change M/W/F due to complexity of tunneled area.  WOC will follow along with you [place], [place]

## 2016-05-12 NOTE — Care Management Important Message (Signed)
Important Message  Patient Details  Name: Kathy Simpson MRN: [id number] Date of Birth: 1951/03/07   Medicare Important Message Given:  Yes    Nathen May 05/12/2016, 12:16 PM

## 2016-05-12 NOTE — Progress Notes (Signed)
Pharmacy Antibiotic Note  Kathy Simpson is a 65 y.o. female admitted on 05/08/2016 with hip cellulitis.  Pharmacy has been consulted for vancomycin dosing. Renal function is stable. VT is above goal at 26 (goal 15-20 mcg/ml).   Plan: Decrease vancomycin to 750 mg IV q8h F/U Staph aureus sensitivities Monitor renal function  Height: 5\' 3"  (160 cm) Weight: (!) 319 lb 9.6 oz (144.97 kg) IBW/kg (Calculated) : 52.4  Temp (24hrs), Avg:98.3 F (36.8 C), Min:98.1 F (36.7 C), Max:98.5 F (36.9 C)   Recent Labs Lab 05/08/16 1800 05/09/16 0041 05/09/16 0148 05/09/16 0333 05/09/16 0441 05/09/16 0752 05/09/16 1131 05/10/16 1804 05/11/16 0642 05/12/16 0657 05/12/16 1303  WBC 12.6*  --   --   --  8.5  --   --   --  5.0 6.0  --   CREATININE 0.55  --   --   --  0.58  --   --  0.60 0.48 0.43*  --   LATICACIDVEN  --  1.1 1.60 2.17*  --  0.9 1.3  --   --   --   --   VANCOTROUGH  --   --   --   --   --   --   --   --   --   --  26*    Estimated Creatinine Clearance: 101.6 mL/min (by C-G formula based on Cr of 0.43).    Allergies  Allergen Reactions  . Metformin And Related Diarrhea  . Actos [Pioglitazone] Other (See Comments)    Congestive heart   . Adhesive [Tape] Other (See Comments)    Pulls off skin  . Dye Fdc Red [Red Dye] Nausea And Vomiting  . Enalapril Swelling  . Livalo [Pitavastatin] Other (See Comments)    Makes leg muscles weak   . Maxzide [Hydrochlorothiazide W-Triamterene] Swelling  . Morphine And Related Itching and Other (See Comments)    Mood changes  . Statins Other (See Comments)    Makes legs numb   . Zetia [Ezetimibe] Swelling  . Prostate [Nutritional Supplements] Hives and Rash    Antimicrobials this admission: Vancomycin 6/14 >>  Zosyn 6/14 >> 6/16  Dose adjustments this admission: 6/16 VT 26 on 1.25g q8h, decr to 750 mg q8h  Microbiology results: 6/13 BC x 2: NGTD  6/14 Thigh wound: rare Staph aureus 6/13 Urine: NG  MRSA PCR +   Thank you  for allowing pharmacy to be a part of this patient's care.  [place], Pharm.D., BCPS Clinical Pharmacist Pager: [phone] 05/12/2016 2:12 PM

## 2016-05-13 ENCOUNTER — Encounter (HOSPITAL_COMMUNITY): Payer: Self-pay | Admitting: *Deleted

## 2016-05-13 LAB — CBC WITH DIFFERENTIAL/PLATELET
BASOS ABS: 0.1 10*3/uL (ref 0.0–0.1)
Basophils Relative: 1 %
Eosinophils Absolute: 0.2 10*3/uL (ref 0.0–0.7)
Eosinophils Relative: 3 %
HCT: 29.1 % — ABNORMAL LOW (ref 36.0–46.0)
HEMOGLOBIN: 8.8 g/dL — AB (ref 12.0–15.0)
LYMPHS ABS: 1.6 10*3/uL (ref 0.7–4.0)
Lymphocytes Relative: 26 %
MCH: 28.7 pg (ref 26.0–34.0)
MCHC: 30.2 g/dL (ref 30.0–36.0)
MCV: 94.8 fL (ref 78.0–100.0)
MONO ABS: 0.5 10*3/uL (ref 0.1–1.0)
Monocytes Relative: 9 %
Neutro Abs: 3.7 10*3/uL (ref 1.7–7.7)
Neutrophils Relative %: 61 %
Platelets: 353 10*3/uL (ref 150–400)
RBC: 3.07 MIL/uL — AB (ref 3.87–5.11)
RDW: 14.9 % (ref 11.5–15.5)
WBC: 6.1 10*3/uL (ref 4.0–10.5)

## 2016-05-13 LAB — AEROBIC CULTURE W GRAM STAIN (SUPERFICIAL SPECIMEN)

## 2016-05-13 LAB — GLUCOSE, CAPILLARY
GLUCOSE-CAPILLARY: 167 mg/dL — AB (ref 65–99)
GLUCOSE-CAPILLARY: 288 mg/dL — AB (ref 65–99)
GLUCOSE-CAPILLARY: 188 mg/dL — AB (ref 65–99)
GLUCOSE-CAPILLARY: 182 mg/dL — AB (ref 65–99)

## 2016-05-13 LAB — BASIC METABOLIC PANEL
Anion gap: 7 (ref 5–15)
BUN: <5 mg/dL — ABNORMAL LOW (ref 6–20)
CALCIUM: 8.3 mg/dL — AB (ref 8.9–10.3)
CO2: 29 mmol/L (ref 22–32)
CREATININE: 0.51 mg/dL (ref 0.44–1.00)
Chloride: 103 mmol/L (ref 101–111)
Glucose, Bld: 215 mg/dL — ABNORMAL HIGH (ref 65–99)
Potassium: 3.3 mmol/L — ABNORMAL LOW (ref 3.5–5.1)
SODIUM: 139 mmol/L (ref 135–145)

## 2016-05-13 LAB — AEROBIC CULTURE  (SUPERFICIAL SPECIMEN)

## 2016-05-13 MED ORDER — HYDROCORTISONE 10 MG PO TABS
10.0000 mg | ORAL_TABLET | Freq: Every evening | ORAL | Status: DC
  Administered 2016-05-13 – 2016-05-14 (×2): 10 mg via ORAL
  Filled 2016-05-13 (×3): qty 1

## 2016-05-13 MED ORDER — HYDROCORTISONE 20 MG PO TABS
20.0000 mg | ORAL_TABLET | Freq: Every day | ORAL | Status: DC
  Administered 2016-05-14 – 2016-05-15 (×2): 20 mg via ORAL
  Filled 2016-05-13 (×2): qty 1

## 2016-05-13 MED ORDER — ENOXAPARIN SODIUM 80 MG/0.8ML ~~LOC~~ SOLN
70.0000 mg | SUBCUTANEOUS | Status: DC
  Administered 2016-05-13 – 2016-05-14 (×2): 70 mg via SUBCUTANEOUS
  Filled 2016-05-13 (×2): qty 0.8

## 2016-05-13 MED ORDER — INSULIN GLARGINE 100 UNIT/ML ~~LOC~~ SOLN
10.0000 [IU] | Freq: Every day | SUBCUTANEOUS | Status: DC
  Administered 2016-05-13 – 2016-05-15 (×2): 10 [IU] via SUBCUTANEOUS
  Filled 2016-05-13 (×3): qty 0.1

## 2016-05-13 MED ORDER — FLUCONAZOLE 100 MG PO TABS
150.0000 mg | ORAL_TABLET | Freq: Once | ORAL | Status: AC
  Administered 2016-05-13: 150 mg via ORAL
  Filled 2016-05-13: qty 2

## 2016-05-13 MED ORDER — SENNA 8.6 MG PO TABS
2.0000 | ORAL_TABLET | Freq: Every day | ORAL | Status: DC
  Administered 2016-05-13: 17.2 mg via ORAL
  Filled 2016-05-13 (×2): qty 2

## 2016-05-13 MED ORDER — POLYETHYLENE GLYCOL 3350 17 G PO PACK
17.0000 g | PACK | Freq: Every day | ORAL | Status: DC
  Administered 2016-05-13: 17 g via ORAL
  Filled 2016-05-13 (×3): qty 1

## 2016-05-13 MED ORDER — INSULIN ASPART 100 UNIT/ML ~~LOC~~ SOLN
4.0000 [IU] | Freq: Three times a day (TID) | SUBCUTANEOUS | Status: DC
  Administered 2016-05-13 – 2016-05-15 (×6): 4 [IU] via SUBCUTANEOUS

## 2016-05-13 MED ORDER — POTASSIUM CHLORIDE CRYS ER 20 MEQ PO TBCR
40.0000 meq | EXTENDED_RELEASE_TABLET | Freq: Once | ORAL | Status: AC
  Administered 2016-05-13: 40 meq via ORAL
  Filled 2016-05-13: qty 2

## 2016-05-13 MED ORDER — OXYCODONE HCL 5 MG PO TABS
5.0000 mg | ORAL_TABLET | Freq: Four times a day (QID) | ORAL | Status: DC | PRN
  Administered 2016-05-14 – 2016-05-15 (×2): 5 mg via ORAL
  Filled 2016-05-13 (×3): qty 1

## 2016-05-13 MED ORDER — LIDOCAINE VISCOUS 2 % MT SOLN
15.0000 mL | OROMUCOSAL | Status: DC | PRN
  Filled 2016-05-13: qty 15

## 2016-05-13 MED ORDER — SPIRONOLACTONE 25 MG PO TABS
12.5000 mg | ORAL_TABLET | Freq: Every day | ORAL | Status: DC
  Administered 2016-05-14 – 2016-05-15 (×2): 12.5 mg via ORAL
  Filled 2016-05-13 (×2): qty 1

## 2016-05-13 NOTE — Progress Notes (Signed)
Pharmacy: Lovenox for VTE Prophylaxis  OBJECTIVE:  Wt: 145 kg, Ht: 63 inches >> BMI~57 SCr 0.51, CrCl>60 ml/min (normalized)  ASSESSMENT:  45 YOF who presented on 6/13 with R-hip cellulitis/wound infection - now s/p I&D on 6/14 with wound vac placed. The patient has been on SCDs for VTE prophylaxis and now pharmacy has been consulted to transition to Lovenox.  The patient appears stable from surgery, Hgb 8.8 << 9, plts wnl. Given BMI>30 and CrCl>30 ml/min - will dose lovenox for obesity at 0.5 mg/kg/day  PLAN:  1. Start Lovenox 70 mg SQ every 24 hours for VTE prophylaxis 2. Pharmacy will sign off of consult but will continue to monitor peripherally for s/sx of bleeding and any renal function changes that would require addressing and/or adjustment.   Alycia Rossetti, PharmD, BCPS Clinical Pharmacist Pager: [phone] 05/13/2016 4:36 PM

## 2016-05-13 NOTE — Progress Notes (Signed)
PROGRESS NOTE  Kathy Simpson  [id number] DOB: [birth year] DOA: 05/08/2016 PCP: [name], MD  Brief Narrative:   Kathy Simpson is a 65 y.o. 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  She developed a blister in right hip after mechanical trauma 2 weeks prior to admission.  It worsened to form a big wound with discharge, bleeding, and erythema to the area overnight. Pt was found to have WBC 12.6, lactate of 1. 60-->2.17, temperature 100.0, tachycardia, no tachypnea, electrolytes renal function okay.   Assessment & Plan:   Principal Problem:   Wound infection ([place]) Active Problems:   OSA (obstructive sleep apnea)   Chronic systolic CHF (congestive heart failure) ([place])   Cellulitis   Hypertension   Diabetes mellitus without complication ([place])   Obesity   Hypothyroid   Sepsis ([place])  Sepsis due to right hip wound infection and cellulitis s/p I&D on 6/14, still with significant pain, swelling, and erythema -  Wound PCR suggests MSSA -  Continue ancef -  F/u wound culture - Blood cultures NGTD  -  Wound vac to be changed on Monday -  Appreciate general surgery assistance -  Decrease narcotcis -  Start bowel regimen to prevent constipation  OSA (obstructive sleep apnea): -refused CPAP  Chronic systolic CHF (congestive heart failure) ([place]): 2-D echo on 09/19/14 showed EF 30-35 percent. Patient is only taking spironolactone currently, not taking Lasix at home. No leg edema. CHF is compensated. -  Stop IVF -Resume spironolactone  -Continue aspirin   HTN, blood pressure elevated -  Resume spironolactone  DM-II: Last A1c 8.4 on 10/29/14, poorly controlled.  -  Continue invokana -  Continue SSI -  Add lantus 10 units with aspart 4 units  with meals  Hypothyroidism: Last TSH was 0.007 on 09/21/14.  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  Resume spironolactone  DVT prophylaxis:  lovenox Code Status:  full Family Communication:  Patient and husband Disposition Plan:  Possibly home Monday after first wound vac change   Consultants:   General surgery   Procedures:  I&D of right hip on 6/14  Antimicrobials:   vanc 6/13 > 6/16  Zosyn 6/13 > 6/16  Ancef 6/16 >    Subjective: Mouth feels dry.  Yeast infection.  Some pains in the right hip.  Concerned about no BM in 2 days.  Asks for decrease in narcotic pain medication.  Requests foley be left in place despite risk for UTI.    Objective: Filed Vitals:   05/12/16 1700 05/12/16 2130 05/13/16 0514 05/13/16 1329  BP: 123/48 162/71 156/54 159/74  Pulse: 81 93 77 98  Temp: 98.5 F (36.9 C) 98.2 F (36.8 C) 98.4 F (36.9 C) 99.6 F (37.6 C)  TempSrc: Oral Oral  Oral Oral  Resp: 18 20 19 20   Height:      Weight:      SpO2: 95% 96% 98% 94%    Intake/Output Summary (Last 24 hours) at 05/13/16 1605 Last data filed at 05/13/16 1329  Gross per 24 hour  Intake    240 ml  Output   9075 ml  Net  -8835 ml   Filed Weights   05/10/16 0522 05/11/16 2147  Weight: 143.246 kg (315 lb 12.8 oz) 144.97 kg (319 lb 9.6 oz)    Examination:  General exam:  Adult female.  No acute  distress.  HEENT:  NCAT, MMM Respiratory system: Clear to auscultation bilaterally Cardiovascular system: Regular rate and rhythm, normal S1/S2. No murmurs, rubs, gallops or clicks.  Warm extremities Gastrointestinal system: Normal active bowel sounds, soft, nondistended, nontender. MSK:  Normal tone and bulk.  Right hip wound vac in place.  Tissue surrounding is pink and indurated and mildly TTP for 7-10 cm in all directions Neuro:  Grossly intact    Data Reviewed: I have personally reviewed following labs and imaging studies  CBC:  Recent Labs Lab 05/08/16 1800 05/09/16 0441 05/11/16 0642 05/12/16 0657 05/13/16 0556  WBC 12.6* 8.5 5.0 6.0 6.1  NEUTROABS 9.7*  --  2.8 3.4 3.7  HGB 11.0* 10.0* 8.5* 9.0* 8.8*  HCT 35.5* 31.1* 28.2* 29.8* 29.1*  MCV 93.9 91.7 94.6 93.4 94.8  PLT 356 349 334 356 0000000   Basic Metabolic Panel:  Recent Labs Lab 05/09/16 0441 05/10/16 1804 05/11/16 0642 05/12/16 0657 05/13/16 0556  NA 136 134* 139 139 139  K 3.3* 3.3* 3.2* 3.3* 3.3*  CL 101 99* 102 102 103  CO2 24 27 27 28 29   GLUCOSE 219* 268* 185* 195* 215*  BUN <5* <5* <5* <5* <5*  CREATININE 0.58 0.60 0.48 0.43* 0.51  CALCIUM 8.1* 7.9* 7.9* 8.3* 8.3*   GFR: Estimated Creatinine Clearance: 101.6 mL/min (by C-G formula based on Cr of 0.51). Liver Function Tests:  Recent Labs Lab 05/08/16 1800  AST 11*  ALT 10*  ALKPHOS 68  BILITOT 0.6  PROT 7.6  ALBUMIN 2.4*   No results for input(s): LIPASE, AMYLASE in the last 168 hours. No results for input(s): AMMONIA in the last 168 hours. Coagulation Profile:  Recent Labs Lab 05/09/16 0441  INR 1.21   Cardiac Enzymes: No results for input(s): CKTOTAL, CKMB, CKMBINDEX, TROPONINI in the last 168 hours. BNP (last 3 results) No results for input(s): PROBNP in the last 8760 hours. HbA1C: No results for input(s): HGBA1C in the last 72 hours. CBG:  Recent Labs Lab 05/12/16 1243 05/12/16 1725 05/12/16 2147 05/13/16 0829  05/13/16 1228  GLUCAP 269* 198* 159* 167* 288*   Lipid Profile: No results for input(s): CHOL, HDL, LDLCALC, TRIG, CHOLHDL, LDLDIRECT in the last 72 hours. Thyroid Function Tests: No results for input(s): TSH, T4TOTAL, FREET4, T3FREE, THYROIDAB in the last 72 hours. Anemia Panel: No results for input(s): VITAMINB12, FOLATE, FERRITIN, TIBC, IRON, RETICCTPCT in the last 72 hours. Urine analysis:    Component Value Date/Time   COLORURINE YELLOW 05/09/2016 [place] 10/29/2014 0123   APPEARANCEUR CLEAR 05/09/2016 0437   APPEARANCEUR [place] 10/29/2014 0123   LABSPEC 1.023 05/09/2016 0437   LABSPEC 1.002 10/29/2014 0123   PHURINE 6.5 05/09/2016 0437   PHURINE 8.0 10/29/2014 0123   GLUCOSEU >1000* 05/09/2016 0437   GLUCOSEU >=500 10/29/2014 0123   HGBUR NEGATIVE 05/09/2016 0437   HGBUR 1+ 10/29/2014 0123  BILIRUBINUR NEGATIVE 05/09/2016 0437   BILIRUBINUR Negative 10/29/2014 0123   KETONESUR 15* 05/09/2016 0437   KETONESUR Negative 10/29/2014 0123   PROTEINUR NEGATIVE 05/09/2016 0437   PROTEINUR 30 mg/dL 10/29/2014 0123   UROBILINOGEN 1.0 09/13/2014 1649   NITRITE NEGATIVE 05/09/2016 0437   NITRITE Negative 10/29/2014 0123   LEUKOCYTESUR NEGATIVE 05/09/2016 0437   LEUKOCYTESUR 2+ 10/29/2014 0123   Sepsis Labs: @LABRCNTIP (procalcitonin:4,lacticidven:4)  ) Recent Results (from the past 240 hour(s))  Blood Culture (routine x 2)     Status: None (Preliminary result)   Collection Time: 05/09/16  1:30 AM  Result Value Ref Range Status   Specimen Description BLOOD RIGHT ARM  Final   Special Requests BOTTLES DRAWN AEROBIC AND ANAEROBIC 5ML  Final   Culture NO GROWTH 3 DAYS  Final   Report Status PENDING  Incomplete  Blood Culture (routine x 2)     Status: None (Preliminary result)   Collection Time: 05/09/16  1:55 AM  Result Value Ref Range Status   Specimen Description BLOOD LEFT ARM  Final   Special Requests IN PEDIATRIC BOTTLE 2ML  Final   Culture NO GROWTH 3  DAYS  Final   Report Status PENDING  Incomplete  Urine culture     Status: None   Collection Time: 05/09/16  4:37 AM  Result Value Ref Range Status   Specimen Description URINE, CATHETERIZED  Final   Special Requests NONE  Final   Culture NO GROWTH  Final   Report Status 05/10/2016 FINAL  Final  Surgical pcr screen     Status: Abnormal   Collection Time: 05/10/16 12:30 AM  Result Value Ref Range Status   MRSA, PCR NEGATIVE NEGATIVE Final   Staphylococcus aureus POSITIVE (A) NEGATIVE Final    Comment:        The Xpert SA Assay (FDA approved for NASAL specimens in patients over 57 years of age), is one component of a comprehensive surveillance program.  Test performance has been validated by [facility] for patients greater than or equal to 74 year old. It is not intended to diagnose infection nor to guide or monitor treatment.   Anaerobic culture     Status: None (Preliminary result)   Collection Time: 05/10/16 10:29 AM  Result Value Ref Range Status   Specimen Description WOUND RIGHT THIGH  Final   Special Requests PATIENT ON FOLLOWING VANCOMYCIN AND ZOSYN  Final   Gram Stain   Final    FEW WBC PRESENT,BOTH PMN AND MONONUCLEAR NO ORGANISMS SEEN    Culture   Final    NO ANAEROBES ISOLATED; CULTURE IN PROGRESS FOR 5 DAYS   Report Status PENDING  Incomplete  Aerobic Culture (superficial specimen)     Status: None (Preliminary result)   Collection Time: 05/10/16 10:29 AM  Result Value Ref Range Status   Specimen Description WOUND RIGHT THIGH  Final   Special Requests PATIENT ON FOLLOWING  VANCOMYCIN AND ZOSYN  Final   Gram Stain   Final    FEW WBC PRESENT,BOTH PMN AND MONONUCLEAR NO ORGANISMS SEEN    Culture   Final    RARE STAPHYLOCOCCUS AUREUS CRITICAL RESULT CALLED TO, READ BACK BY AND VERIFIED WITH: A MINTZ 05/11/16 @ [place] TO FOLLOW    Report Status PENDING  Incomplete      Radiology Studies: No results found.   Scheduled Meds: .  aspirin EC  81 mg Oral Q breakfast  . canagliflozin  100 mg Oral QAC breakfast  .  ceFAZolin (ANCEF) IV  2 g Intravenous Q8H  . escitalopram  20 mg Oral Daily  . gabapentin  600 mg Oral BID  . hydrocortisone  20 mg Oral BID WC  . insulin aspart  0-5 Units Subcutaneous QHS  . insulin aspart  0-9 Units Subcutaneous TID WC  . levothyroxine  175 mcg Oral QAC breakfast  . nystatin   Topical BID  . polyethylene glycol  17 g Oral Daily  . rOPINIRole  2 mg Oral BID  . senna  2 tablet Oral QHS  . sodium chloride flush  3 mL Intravenous Q12H   Continuous Infusions:    LOS: 3 days    Time spent: 30 min    Janece Canterbury, MD [facility] Pager [phone]  If 7PM-7AM, please contact night-coverage [email or web address] Password [facility] 05/13/2016, 4:05 PM

## 2016-05-14 LAB — CULTURE, BLOOD (ROUTINE X 2)
CULTURE: NO GROWTH
Culture: NO GROWTH

## 2016-05-14 LAB — GLUCOSE, CAPILLARY
GLUCOSE-CAPILLARY: 229 mg/dL — AB (ref 65–99)
GLUCOSE-CAPILLARY: 184 mg/dL — AB (ref 65–99)
Glucose-Capillary: 137 mg/dL — ABNORMAL HIGH (ref 65–99)
Glucose-Capillary: 183 mg/dL — ABNORMAL HIGH (ref 65–99)

## 2016-05-14 LAB — T4, FREE: Free T4: 1.07 ng/dL (ref 0.61–1.12)

## 2016-05-14 NOTE — Progress Notes (Signed)
PROGRESS NOTE  Kathy Simpson  [id number] DOB: June 16, 1951 DOA: 05/08/2016 PCP: [name], MD  Brief Narrative:   Kathy Simpson is a 65 y.o. 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  She developed a blister in right hip after mechanical trauma 2 weeks prior to admission.  It worsened to form a big wound with discharge, bleeding, and erythema to the area overnight. Pt was found to have WBC 12.6, lactate of 1. 60-->2.17, temperature 100.0, tachycardia, no tachypnea, electrolytes renal function okay.   Assessment & Plan:   Principal Problem:   Wound infection ([place]) Active Problems:   OSA (obstructive sleep apnea)   Chronic systolic CHF (congestive heart failure) ([place])   Cellulitis   Hypertension   Diabetes mellitus without complication ([facility])   Obesity   Hypothyroid   Sepsis ([place])  Sepsis due to right hip wound infection and cellulitis s/p I&D on 6/14,  pain, swelling, and erythema improving -  Wound PCR suggests MSSA -  Continue ancef for now -  MSSA sensitivities are below and could use cephalosporin, bactrim, doxy per MD preference at discharge -  Blood cultures NGTD  -  Wound vac to be changed on Monday   OSA (obstructive sleep apnea): -refused CPAP  Chronic systolic CHF (congestive heart failure) ([place]): 2-D echo on 09/19/14 showed EF 30-35 percent. Patient is only taking spironolactone currently, not taking Lasix at home. No leg edema. CHF is compensated. -continue spironolactone  -Continue aspirin   HTN, blood pressure elevated -  Continue spironolactone  DM-II: Last A1c 8.4 on 10/29/14, poorly controlled.  -  Continue invokana -  Continue SSI -  Continue lantus 10 units with aspart 4 units with meals  Hypothyroidism: Last  TSH was 0.007 on 09/21/14.  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  Resume spironolactone  DVT prophylaxis:  lovenox Code Status:  full Family Communication:  Patient and husband Disposition Plan:  Will ready to go home Monday after first wound vac change on oral antibiotics.   Consultants:   General surgery   Procedures:  I&D of right hip on 6/14  Antimicrobials:   vanc 6/13 > 6/16  Zosyn 6/13 > 6/16  Ancef 6/16 >    Subjective: Mouth feels better.  Yeast infection improving. Decreased pain on the right hip. Not using as much narcotic pain medication.    Objective: Filed Vitals:   05/13/16 2215 05/14/16 0500 05/14/16 0512 05/14/16 1409  BP: 161/53  158/53 143/45  Pulse: 86  72 80  Temp: 98.8 F (37.1 C)  99.3 F (37.4 C) 98.6 F (37 C)  TempSrc: Oral  Oral Oral  Resp: 20  20 19   Height:      Weight:  140.161 kg (309 lb)    SpO2: 92%  93% 95%    Intake/Output Summary (Last  24 hours) at 05/14/16 1630 Last data filed at 05/14/16 1410  Gross per 24 hour  Intake    700 ml  Output   4975 ml  Net  -4275 ml   Filed Weights   05/10/16 0522 05/11/16 2147 05/14/16 0500  Weight: 143.246 kg (315 lb 12.8 oz) 144.97 kg (319 lb 9.6 oz) 140.161 kg (309 lb)    Examination:  General exam:  Adult female.  No acute distress.  HEENT:  NCAT, MMM Respiratory system: Clear to auscultation bilaterally Cardiovascular system: Regular rate and rhythm, normal  S1/S2. No murmurs, rubs, gallops or clicks.  Warm extremities Gastrointestinal system: Normal active bowel sounds, soft, nondistended, nontender. MSK:  Normal tone and bulk.  Right hip wound vac in place.  Tissue surrounding is much less pink and indurated compared to yesterday.   Neuro:  Grossly intact    Data Reviewed: I have personally reviewed following labs and imaging studies  CBC:  Recent Labs Lab 05/08/16 1800 05/09/16 0441 05/11/16 0642 05/12/16 0657 05/13/16 0556  WBC 12.6* 8.5 5.0 6.0 6.1  NEUTROABS 9.7*  --  2.8 3.4 3.7  HGB 11.0* 10.0* 8.5* 9.0* 8.8*  HCT 35.5* 31.1* 28.2* 29.8* 29.1*  MCV 93.9 91.7 94.6 93.4 94.8  PLT 356 349 334 356 0000000   Basic Metabolic Panel:  Recent Labs Lab 05/09/16 0441 05/10/16 1804 05/11/16 0642 05/12/16 0657 05/13/16 0556  NA 136 134* 139 139 139  K 3.3* 3.3* 3.2* 3.3* 3.3*  CL 101 99* 102 102 103  CO2 24 27 27 28 29   GLUCOSE 219* 268* 185* 195* 215*  BUN <5* <5* <5* <5* <5*  CREATININE 0.58 0.60 0.48 0.43* 0.51  CALCIUM 8.1* 7.9* 7.9* 8.3* 8.3*   GFR: Estimated Creatinine Clearance: 99.4 mL/min (by C-G formula based on Cr of 0.51). Liver Function Tests:  Recent Labs Lab 05/08/16 1800  AST 11*  ALT 10*  ALKPHOS 68  BILITOT 0.6  PROT 7.6  ALBUMIN 2.4*   No results for input(s): LIPASE, AMYLASE in the last 168 hours. No results for input(s): AMMONIA in the last 168 hours. Coagulation Profile:  Recent Labs Lab 05/09/16 0441  INR 1.21   Cardiac Enzymes: No results for input(s): CKTOTAL, CKMB, CKMBINDEX, TROPONINI in the last 168 hours. BNP (last 3 results) No results for input(s): PROBNP in the last 8760 hours. HbA1C: No results for input(s): HGBA1C in the last 72 hours. CBG:  Recent Labs Lab 05/13/16 1228 05/13/16 1624 05/13/16 2211 05/14/16 0740 05/14/16 1216  GLUCAP 288* 188* 182* 137* 229*   Lipid Profile: No results for input(s): CHOL, HDL, LDLCALC, TRIG, CHOLHDL, LDLDIRECT in the last 72  hours. Thyroid Function Tests:  Recent Labs  05/14/16 0426  FREET4 1.07   Anemia Panel: No results for input(s): VITAMINB12, FOLATE, FERRITIN, TIBC, IRON, RETICCTPCT in the last 72 hours. Urine analysis:    Component Value Date/Time   COLORURINE YELLOW 05/09/2016 [place] 10/29/2014 0123   APPEARANCEUR CLEAR 05/09/2016 0437   APPEARANCEUR [place] 10/29/2014 0123   LABSPEC 1.023 05/09/2016 0437   LABSPEC 1.002 10/29/2014 0123   PHURINE 6.5 05/09/2016 0437   PHURINE 8.0 10/29/2014 0123   GLUCOSEU >1000* 05/09/2016 0437   GLUCOSEU >=500 10/29/2014 0123   HGBUR NEGATIVE 05/09/2016 0437   HGBUR 1+ 10/29/2014 0123   BILIRUBINUR NEGATIVE 05/09/2016 0437   BILIRUBINUR Negative 10/29/2014 0123   KETONESUR 15* 05/09/2016 0437   KETONESUR Negative 10/29/2014 0123   PROTEINUR NEGATIVE 05/09/2016 0437  PROTEINUR 30 mg/dL 10/29/2014 0123   UROBILINOGEN 1.0 09/13/2014 1649   NITRITE NEGATIVE 05/09/2016 0437   NITRITE Negative 10/29/2014 0123   LEUKOCYTESUR NEGATIVE 05/09/2016 0437   LEUKOCYTESUR 2+ 10/29/2014 0123   Sepsis Labs: @LABRCNTIP (procalcitonin:4,lacticidven:4)  ) Recent Results (from the past 240 hour(s))  Blood Culture (routine x 2)     Status: None (Preliminary result)   Collection Time: 05/09/16  1:30 AM  Result Value Ref Range Status   Specimen Description BLOOD RIGHT ARM  Final   Special Requests BOTTLES DRAWN AEROBIC AND ANAEROBIC 5ML  Final   Culture NO GROWTH 4 DAYS  Final   Report Status PENDING  Incomplete  Blood Culture (routine x 2)     Status: None (Preliminary result)   Collection Time: 05/09/16  1:55 AM  Result Value Ref Range Status   Specimen Description BLOOD LEFT ARM  Final   Special Requests IN PEDIATRIC BOTTLE 2ML  Final   Culture NO GROWTH 4 DAYS  Final   Report Status PENDING  Incomplete  Urine culture     Status: None   Collection Time: 05/09/16  4:37 AM  Result Value Ref Range Status   Specimen Description URINE, CATHETERIZED   Final   Special Requests NONE  Final   Culture NO GROWTH  Final   Report Status 05/10/2016 FINAL  Final  Surgical pcr screen     Status: Abnormal   Collection Time: 05/10/16 12:30 AM  Result Value Ref Range Status   MRSA, PCR NEGATIVE NEGATIVE Final   Staphylococcus aureus POSITIVE (A) NEGATIVE Final    Comment:        The Xpert SA Assay (FDA approved for NASAL specimens in patients over 22 years of age), is one component of a comprehensive surveillance program.  Test performance has been validated by [facility] for patients greater than or equal to 67 year old. It is not intended to diagnose infection nor to guide or monitor treatment.   Anaerobic culture     Status: None (Preliminary result)   Collection Time: 05/10/16 10:29 AM  Result Value Ref Range Status   Specimen Description WOUND RIGHT THIGH  Final   Special Requests PATIENT ON FOLLOWING VANCOMYCIN AND ZOSYN  Final   Gram Stain   Final    FEW WBC PRESENT,BOTH PMN AND MONONUCLEAR NO ORGANISMS SEEN    Culture   Final    NO ANAEROBES ISOLATED; CULTURE IN PROGRESS FOR 5 DAYS   Report Status PENDING  Incomplete  Aerobic Culture (superficial specimen)     Status: None   Collection Time: 05/10/16 10:29 AM  Result Value Ref Range Status   Specimen Description WOUND RIGHT THIGH  Final   Special Requests PATIENT ON FOLLOWING  VANCOMYCIN AND ZOSYN  Final   Gram Stain   Final    FEW WBC PRESENT,BOTH PMN AND MONONUCLEAR NO ORGANISMS SEEN    Culture   Final    RARE STAPHYLOCOCCUS AUREUS CRITICAL RESULT CALLED TO, READ BACK BY AND VERIFIED WITH: A MINTZ 05/11/16 @ 1214 M [place]    Report Status 05/13/2016 FINAL  Final   Organism ID, Bacteria STAPHYLOCOCCUS AUREUS  Final      Susceptibility   Staphylococcus aureus - MIC*    CIPROFLOXACIN >=8 RESISTANT Resistant     ERYTHROMYCIN >=8 RESISTANT Resistant     GENTAMICIN <=0.5 SENSITIVE Sensitive     OXACILLIN 0.5 SENSITIVE Sensitive     TETRACYCLINE <=1 SENSITIVE  Sensitive     VANCOMYCIN <=0.5 SENSITIVE Sensitive  TRIMETH/SULFA <=10 SENSITIVE Sensitive     CLINDAMYCIN <=0.25 SENSITIVE Sensitive     RIFAMPIN <=0.5 SENSITIVE Sensitive     Inducible Clindamycin NEGATIVE Sensitive     * RARE STAPHYLOCOCCUS AUREUS      Radiology Studies: No results found.   Scheduled Meds: . aspirin EC  81 mg Oral Q breakfast  . canagliflozin  100 mg Oral QAC breakfast  .  ceFAZolin (ANCEF) IV  2 g Intravenous Q8H  . enoxaparin (LOVENOX) injection  70 mg Subcutaneous Q24H  . escitalopram  20 mg Oral Daily  . gabapentin  600 mg Oral BID  . hydrocortisone  10 mg Oral QPM  . hydrocortisone  20 mg Oral Daily  . insulin aspart  0-5 Units Subcutaneous QHS  . insulin aspart  0-9 Units Subcutaneous TID WC  . insulin aspart  4 Units Subcutaneous TID WC  . insulin glargine  10 Units Subcutaneous QHS  . levothyroxine  175 mcg Oral QAC breakfast  . nystatin   Topical BID  . polyethylene glycol  17 g Oral Daily  . rOPINIRole  2 mg Oral BID  . senna  2 tablet Oral QHS  . sodium chloride flush  3 mL Intravenous Q12H  . spironolactone  12.5 mg Oral Daily   Continuous Infusions:    LOS: 4 days    Time spent: 30 min    Janece Canterbury, MD [facility] Pager [phone]  If 7PM-7AM, please contact night-coverage [email or web address] Password [facility] 05/14/2016, 4:30 PM

## 2016-05-15 DIAGNOSIS — L03115 Cellulitis of right lower limb: Secondary | ICD-10-CM | POA: Diagnosis not present

## 2016-05-15 DIAGNOSIS — G4733 Obstructive sleep apnea (adult) (pediatric): Secondary | ICD-10-CM | POA: Diagnosis not present

## 2016-05-15 DIAGNOSIS — E039 Hypothyroidism, unspecified: Secondary | ICD-10-CM | POA: Diagnosis not present

## 2016-05-15 DIAGNOSIS — T798XXD Other early complications of trauma, subsequent encounter: Secondary | ICD-10-CM | POA: Diagnosis not present

## 2016-05-15 DIAGNOSIS — I10 Essential (primary) hypertension: Secondary | ICD-10-CM | POA: Diagnosis not present

## 2016-05-15 DIAGNOSIS — A419 Sepsis, unspecified organism: Principal | ICD-10-CM

## 2016-05-15 DIAGNOSIS — E119 Type 2 diabetes mellitus without complications: Secondary | ICD-10-CM | POA: Diagnosis not present

## 2016-05-15 DIAGNOSIS — L089 Local infection of the skin and subcutaneous tissue, unspecified: Secondary | ICD-10-CM

## 2016-05-15 DIAGNOSIS — I5022 Chronic systolic (congestive) heart failure: Secondary | ICD-10-CM | POA: Diagnosis not present

## 2016-05-15 DIAGNOSIS — F329 Major depressive disorder, single episode, unspecified: Secondary | ICD-10-CM | POA: Diagnosis not present

## 2016-05-15 DIAGNOSIS — T148 Other injury of unspecified body region: Secondary | ICD-10-CM

## 2016-05-15 LAB — ANAEROBIC CULTURE

## 2016-05-15 LAB — GLUCOSE, CAPILLARY
GLUCOSE-CAPILLARY: 151 mg/dL — AB (ref 65–99)
Glucose-Capillary: 131 mg/dL — ABNORMAL HIGH (ref 65–99)

## 2016-05-15 MED ORDER — SENNA 8.6 MG PO TABS: 2.0000 | ORAL_TABLET | Freq: Every day | ORAL | Status: AC

## 2016-05-15 MED ORDER — INSULIN GLARGINE 100 UNIT/ML ~~LOC~~ SOLN: 10.0000 [IU] | Freq: Every day | SUBCUTANEOUS | Status: DC

## 2016-05-15 MED ORDER — OXYCODONE-ACETAMINOPHEN 5-325 MG PO TABS: 1.0000 | ORAL_TABLET | ORAL | Status: AC | PRN

## 2016-05-15 MED ORDER — ACETAMINOPHEN 500 MG PO TABS: 500.0000 mg | ORAL_TABLET | Freq: Four times a day (QID) | ORAL | Status: DC | PRN

## 2016-05-15 MED ORDER — DIPHENHYDRAMINE HCL 25 MG PO CAPS
25.0000 mg | ORAL_CAPSULE | Freq: Once | ORAL | Status: AC
  Administered 2016-05-15: 25 mg via ORAL
  Filled 2016-05-15: qty 1

## 2016-05-15 MED ORDER — AMOXICILLIN-POT CLAVULANATE 875-125 MG PO TABS: 1.0000 | ORAL_TABLET | Freq: Two times a day (BID) | ORAL | Status: DC

## 2016-05-15 MED ORDER — INSULIN ASPART 100 UNIT/ML ~~LOC~~ SOLN: 0.0000 [IU] | Freq: Three times a day (TID) | SUBCUTANEOUS | Status: AC

## 2016-05-15 MED ORDER — ACETAMINOPHEN 500 MG PO TABS: 500.0000 mg | ORAL_TABLET | Freq: Four times a day (QID) | ORAL | Status: AC | PRN

## 2016-05-15 MED ORDER — SENNA 8.6 MG PO TABS: 2.0000 | ORAL_TABLET | Freq: Every day | ORAL | Status: DC

## 2016-05-15 MED ORDER — INSULIN ASPART 100 UNIT/ML ~~LOC~~ SOLN: 0.0000 [IU] | Freq: Three times a day (TID) | SUBCUTANEOUS | Status: DC

## 2016-05-15 NOTE — Care Management Important Message (Signed)
Important Message  Patient Details  Name: Kathy Simpson MRN: [id number] Date of Birth: July 09, 1951   Medicare Important Message Given:  Yes    Nathen May 05/15/2016, 11:42 AM

## 2016-05-15 NOTE — Progress Notes (Signed)
Pharmacy Antibiotic Note Kathy Simpson is a 65 y.o. female admitted on 05/08/2016 with hip cellulitis. Currently on Ancef for MSSA isolated from R though wound.      Plan: 1. Continue Cefazolin 2 grams IV every 8 hours   Height: 5\' 3"  (160 cm) Weight: (!) 309 lb (140.161 kg) IBW/kg (Calculated) : 52.4  Temp (24hrs), Avg:99.1 F (37.3 C), Min:98.6 F (37 C), Max:99.7 F (37.6 C)   Recent Labs Lab 05/08/16 1800 05/09/16 0041 05/09/16 0148 05/09/16 0333 05/09/16 0441 05/09/16 0752 05/09/16 1131 05/10/16 1804 05/11/16 0642 05/12/16 0657 05/12/16 1303 05/13/16 0556  WBC 12.6*  --   --   --  8.5  --   --   --  5.0 6.0  --  6.1  CREATININE 0.55  --   --   --  0.58  --   --  0.60 0.48 0.43*  --  0.51  LATICACIDVEN  --  1.1 1.60 2.17*  --  0.9 1.3  --   --   --   --   --   VANCOTROUGH  --   --   --   --   --   --   --   --   --   --  26*  --     Estimated Creatinine Clearance: 99.4 mL/min (by C-G formula based on Cr of 0.51).    Allergies  Allergen Reactions  . Metformin And Related Diarrhea  . Actos [Pioglitazone] Other (See Comments)    Congestive heart   . Adhesive [Tape] Other (See Comments)    Pulls off skin  . Enalapril Swelling  . Livalo [Pitavastatin] Other (See Comments)    Makes leg muscles weak   . Maxzide [Hydrochlorothiazide W-Triamterene] Swelling  . Morphine And Related Itching and Other (See Comments)    Mood changes  . Statins Other (See Comments)    Makes legs numb   . Zetia [Ezetimibe] Swelling  . Prostate [Nutritional Supplements] Hives and Rash    Antimicrobials this admission: Vancomycin 6/14 >>  Zosyn 6/14 >> 6/16  Cefazolin 6/16>  Dose adjustments this admission: 6/16 VT 26 on 1.25g q8h, decr to 750 mg q8h  Microbiology results: 6/14 Thigh wound: MSSA 6/13 BC x 2: ngF 6/13 Urine: ngF MRSA PCR +   Thank you for allowing pharmacy to be a part of this patient's care.  Vincenza Hews, PharmD, BCPS 05/15/2016, 10:08 AM Pager:  [phone]

## 2016-05-15 NOTE — Progress Notes (Signed)
Physical Therapy Treatment Patient Details Name: Kathy Simpson MRN: [id number] DOB: 1951-05-04 Today's Date: 05/15/2016    History of Present Illness Kathy Simpson is a 65 y.o. 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    PT Comments    Patient continues to make progress with mobility with increased activity tolerance and decreased pain. Ambulated 234ft with RW and supervision. Current plan remains appropriate.  Follow Up Recommendations  No PT follow up     Equipment Recommendations  None recommended by PT    Recommendations for Other Services       Precautions / Restrictions Precautions Precautions: Fall Restrictions Weight Bearing Restrictions: No    Mobility  Bed Mobility               General bed mobility comments: OOB in chair upon arrival  Transfers Overall transfer level: Modified independent Equipment used: Rolling walker (2 wheeled) Transfers: Sit to/from Stand Sit to Stand: Modified independent (Device/Increase time)         General transfer comment: increased time and RW  Ambulation/Gait Ambulation/Gait assistance: Supervision Ambulation Distance (Feet): 225 Feet Assistive device: Rolling walker (2 wheeled) Gait Pattern/deviations: Step-through pattern;Decreased stance time - left;Decreased step length - right;Decreased weight shift to left     General Gait Details: verbal and tactile cues for posture with pt flexing trunk during stance phase on L side but improved from previous sessions; increased cadence and no rest breaks needed   Stairs            Wheelchair Mobility    Modified Rankin (Stroke Patients Only)       Balance     Sitting balance-Leahy Scale: Fair       Standing balance-Leahy  Scale: Fair                      Cognition Arousal/Alertness: Awake/alert Behavior During Therapy: WFL for tasks assessed/performed Overall Cognitive Status: Within Functional Limits for tasks assessed                      Exercises      General Comments        Pertinent Vitals/Pain Pain Assessment: Faces Faces Pain Scale: Hurts a little bit Pain Location: L LE and L side of back Pain Descriptors / Indicators: Sore Pain Intervention(s): Monitored during session    Home Living                      Prior Function            PT Goals (current goals can now be found in the care plan section) Acute Rehab PT Goals Patient Stated Goal: walk farther Progress towards PT goals: Progressing toward goals    Frequency  Min 3X/week    PT Plan Current plan remains appropriate    Co-evaluation             End of Session Equipment Utilized During Treatment: Gait belt Activity Tolerance: Patient tolerated treatment well Patient left: with call bell/phone within reach;in chair;with family/visitor present     Time: [id number] PT Time Calculation (min) (ACUTE ONLY): 12 min  Charges:  $Gait Training: 8-22 mins  G Codes:      Salina April, PTA Pager: [phone]   05/15/2016, 4:46 PM

## 2016-05-15 NOTE — Progress Notes (Signed)
PROGRESS NOTE  NAREH Simpson  [id number] DOB: [birth year] DOA: 05/08/2016 PCP: [name], MD  Brief Narrative:   Kathy Simpson is a 65 y.o. 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  She developed a blister in right hip after mechanical trauma 2 weeks prior to admission.  It worsened to form a big wound with discharge, bleeding, and erythema to the area overnight. Pt was found to have WBC 12.6, lactate of 1. 60-->2.17, temperature 100.0, tachycardia, no tachypnea, electrolytes renal function okay.   Assessment & Plan:   Principal Problem:   Wound infection ([place]) Active Problems:   OSA (obstructive sleep apnea)   Chronic systolic CHF (congestive heart failure) (HCC)   Cellulitis   Hypertension   Diabetes mellitus without complication (H[facility])   Obesity   Hypothyroid   Sepsis ([place])  Sepsis due to right hip wound infection and cellulitis s/p I&D on 6/14,  pain, swelling, and erythema improving -  Wound PCR suggests MSSA, Blood cultures NGTD. Continue ancef for now, will change to oral atx at discharge -  Need to arrange wound vac, home care.    OSA (obstructive sleep apnea): refused CPAP  Chronic systolic CHF (congestive heart failure) ([place]): 2-D echo on 09/19/14 showed EF 30-35 percent. Patient is only taking spironolactone currently, not taking Lasix at home. No leg edema. CHF is compensated. -continue spironolactone, Continue aspirin   HTN, blood pressure elevated, Continue spironolactone  DM-II: Last A1c 8.4 on 10/29/14, poorly controlled. Continue invokana,   Continue SSI+ lantus 10 units with aspart 4 units with meals.   Hypothyroidism: Last TSH was 0.007 on 09/21/14.  TSH again low (surgical panhypopit), but fT4 wnl. 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  Defer iron studies due to active infection. Work up for anemia by PCP unless already complete  Morbid obesity, recommend increased dietary fiber and decreased calories  Hypokalemia, oral potassium repletion.  Resume spironolactone  DVT prophylaxis:  lovenox Code Status:  full Family Communication:  Patient and husband Disposition Plan:  Possible d/c home today if able to arrange wound, HHC vac change on oral antibiotics.   Consultants:   General surgery   Procedures:  I&D of right hip on 6/14  Antimicrobials:   vanc 6/13 > 6/16  Zosyn 6/13 > 6/16  Ancef 6/16 >    Subjective: Mouth feels better.  Yeast infection improving. Decreased pain on the right hip. Not using as much narcotic pain medication.    Objective: Filed Vitals:   05/14/16 0512 05/14/16 1409 05/14/16 2158 05/15/16 0554  BP: 158/53 143/45 149/46 164/50  Pulse: 72 80 77 73  Temp: 99.3 F (37.4 C) 98.6 F (37 C) 99.7 F (37.6 C) 99.1 F (37.3 C)  TempSrc: Oral Oral  Oral  Resp: 20 19 18 18   Height:      Weight:      SpO2: 93% 95% 95% 97%    Intake/Output Summary (Last 24 hours) at 05/15/16 1151 Last data filed at 05/15/16 1149  Gross per 24 hour  Intake    943 ml  Output   5600 ml  Net  -  4657 ml   Filed Weights   05/10/16 0522 05/11/16 2147 05/14/16 0500  Weight: 143.246 kg (315 lb 12.8 oz) 144.97 kg (319 lb 9.6 oz) 140.161 kg (309 lb)    Examination:  General exam:  Adult female.  No acute distress.  HEENT:  NCAT, MMM Respiratory system: Clear to auscultation bilaterally Cardiovascular system: Regular rate and rhythm, normal S1/S2. No murmurs, rubs, gallops or clicks.  Warm extremities Gastrointestinal system: Normal active bowel sounds,  soft, nondistended, nontender. MSK:  Normal tone and bulk.  Right hip wound vac in place.  Tissue surrounding is much less pink and indurated compared to yesterday.   Neuro:  Grossly intact    Data Reviewed: I have personally reviewed following labs and imaging studies  CBC:  Recent Labs Lab 05/08/16 1800 05/09/16 0441 05/11/16 0642 05/12/16 0657 05/13/16 0556  WBC 12.6* 8.5 5.0 6.0 6.1  NEUTROABS 9.7*  --  2.8 3.4 3.7  HGB 11.0* 10.0* 8.5* 9.0* 8.8*  HCT 35.5* 31.1* 28.2* 29.8* 29.1*  MCV 93.9 91.7 94.6 93.4 94.8  PLT 356 349 334 356 0000000   Basic Metabolic Panel:  Recent Labs Lab 05/09/16 0441 05/10/16 1804 05/11/16 0642 05/12/16 0657 05/13/16 0556  NA 136 134* 139 139 139  K 3.3* 3.3* 3.2* 3.3* 3.3*  CL 101 99* 102 102 103  CO2 24 27 27 28 29   GLUCOSE 219* 268* 185* 195* 215*  BUN <5* <5* <5* <5* <5*  CREATININE 0.58 0.60 0.48 0.43* 0.51  CALCIUM 8.1* 7.9* 7.9* 8.3* 8.3*   GFR: Estimated Creatinine Clearance: 99.4 mL/min (by C-G formula based on Cr of 0.51). Liver Function Tests:  Recent Labs Lab 05/08/16 1800  AST 11*  ALT 10*  ALKPHOS 68  BILITOT 0.6  PROT 7.6  ALBUMIN 2.4*   No results for input(s): LIPASE, AMYLASE in the last 168 hours. No results for input(s): AMMONIA in the last 168 hours. Coagulation Profile:  Recent Labs Lab 05/09/16 0441  INR 1.21   Cardiac Enzymes: No results for input(s): CKTOTAL, CKMB, CKMBINDEX, TROPONINI in the last 168 hours. BNP (last 3 results) No results for input(s): PROBNP in the last 8760 hours. HbA1C: No results for input(s): HGBA1C in the last 72 hours. CBG:  Recent Labs Lab 05/14/16 0740 05/14/16 1216 05/14/16 1635 05/14/16 2156 05/15/16 0808  GLUCAP 137* 229* 184* 183* 131*   Lipid Profile: No results for input(s): CHOL, HDL, LDLCALC, TRIG, CHOLHDL, LDLDIRECT in the last 72 hours. Thyroid Function Tests:  Recent Labs  05/14/16 0426  FREET4 1.07   Anemia Panel: No results for  input(s): VITAMINB12, FOLATE, FERRITIN, TIBC, IRON, RETICCTPCT in the last 72 hours. Urine analysis:    Component Value Date/Time   COLORURINE YELLOW 05/09/2016 [place] 10/29/2014 0123   APPEARANCEUR CLEAR 05/09/2016 0437   APPEARANCEUR [place] 10/29/2014 0123   LABSPEC 1.023 05/09/2016 0437   LABSPEC 1.002 10/29/2014 0123   PHURINE 6.5 05/09/2016 0437   PHURINE 8.0 10/29/2014 0123   GLUCOSEU >1000* 05/09/2016 0437   GLUCOSEU >=500 10/29/2014 0123   HGBUR NEGATIVE 05/09/2016 0437   HGBUR 1+ 10/29/2014 0123   BILIRUBINUR NEGATIVE 05/09/2016 0437   BILIRUBINUR Negative 10/29/2014 0123   KETONESUR 15* 05/09/2016 0437   KETONESUR Negative 10/29/2014 0123   PROTEINUR NEGATIVE 05/09/2016 0437   PROTEINUR 30 mg/dL 10/29/2014 0123   UROBILINOGEN 1.0 09/13/2014 1649   NITRITE NEGATIVE 05/09/2016 0437   NITRITE Negative 10/29/2014 0123   LEUKOCYTESUR NEGATIVE 05/09/2016 0437   LEUKOCYTESUR  2+ 10/29/2014 0123   Sepsis Labs: @LABRCNTIP (procalcitonin:4,lacticidven:4)  ) Recent Results (from the past 240 hour(s))  Blood Culture (routine x 2)     Status: None   Collection Time: 05/09/16  1:30 AM  Result Value Ref Range Status   Specimen Description BLOOD RIGHT ARM  Final   Special Requests BOTTLES DRAWN AEROBIC AND ANAEROBIC 5ML  Final   Culture NO GROWTH 5 DAYS  Final   Report Status 05/14/2016 FINAL  Final  Blood Culture (routine x 2)     Status: None   Collection Time: 05/09/16  1:55 AM  Result Value Ref Range Status   Specimen Description BLOOD LEFT ARM  Final   Special Requests IN PEDIATRIC BOTTLE 2ML  Final   Culture NO GROWTH 5 DAYS  Final   Report Status 05/14/2016 FINAL  Final  Urine culture     Status: None   Collection Time: 05/09/16  4:37 AM  Result Value Ref Range Status   Specimen Description URINE, CATHETERIZED  Final   Special Requests NONE  Final   Culture NO GROWTH  Final   Report Status 05/10/2016 FINAL  Final  Surgical pcr screen     Status:  Abnormal   Collection Time: 05/10/16 12:30 AM  Result Value Ref Range Status   MRSA, PCR NEGATIVE NEGATIVE Final   Staphylococcus aureus POSITIVE (A) NEGATIVE Final    Comment:        The Xpert SA Assay (FDA approved for NASAL specimens in patients over 36 years of age), is one component of a comprehensive surveillance program.  Test performance has been validated by [facility] for patients greater than or equal to 15 year old. It is not intended to diagnose infection nor to guide or monitor treatment.   Anaerobic culture     Status: None   Collection Time: 05/10/16 10:29 AM  Result Value Ref Range Status   Specimen Description WOUND RIGHT THIGH  Final   Special Requests PATIENT ON FOLLOWING VANCOMYCIN AND ZOSYN  Final   Gram Stain   Final    FEW WBC PRESENT,BOTH PMN AND MONONUCLEAR NO ORGANISMS SEEN    Culture NO ANAEROBES ISOLATED  Final   Report Status 05/15/2016 FINAL  Final  Aerobic Culture (superficial specimen)     Status: None   Collection Time: 05/10/16 10:29 AM  Result Value Ref Range Status   Specimen Description WOUND RIGHT THIGH  Final   Special Requests PATIENT ON FOLLOWING  VANCOMYCIN AND ZOSYN  Final   Gram Stain   Final    FEW WBC PRESENT,BOTH PMN AND MONONUCLEAR NO ORGANISMS SEEN    Culture   Final    RARE STAPHYLOCOCCUS AUREUS CRITICAL RESULT CALLED TO, READ BACK BY AND VERIFIED WITH: A MINTZ 05/11/16 @ [id number] M VESTAL    Report Status 05/13/2016 FINAL  Final   Organism ID, Bacteria STAPHYLOCOCCUS AUREUS  Final      Susceptibility   Staphylococcus aureus - MIC*    CIPROFLOXACIN >=8 RESISTANT Resistant     ERYTHROMYCIN >=8 RESISTANT Resistant     GENTAMICIN <=0.5 SENSITIVE Sensitive     OXACILLIN 0.5 SENSITIVE Sensitive     TETRACYCLINE <=1 SENSITIVE Sensitive     VANCOMYCIN <=0.5 SENSITIVE Sensitive     TRIMETH/SULFA <=10 SENSITIVE Sensitive     CLINDAMYCIN <=0.25 SENSITIVE Sensitive     RIFAMPIN <=0.5 SENSITIVE Sensitive     Inducible  Clindamycin NEGATIVE Sensitive     * RARE STAPHYLOCOCCUS AUREUS  Radiology Studies: No results found.   Scheduled Meds: . aspirin EC  81 mg Oral Q breakfast  . canagliflozin  100 mg Oral QAC breakfast  .  ceFAZolin (ANCEF) IV  2 g Intravenous Q8H  . enoxaparin (LOVENOX) injection  70 mg Subcutaneous Q24H  . escitalopram  20 mg Oral Daily  . gabapentin  600 mg Oral BID  . hydrocortisone  10 mg Oral QPM  . hydrocortisone  20 mg Oral Daily  . insulin aspart  0-5 Units Subcutaneous QHS  . insulin aspart  0-9 Units Subcutaneous TID WC  . insulin aspart  4 Units Subcutaneous TID WC  . insulin glargine  10 Units Subcutaneous QHS  . levothyroxine  175 mcg Oral QAC breakfast  . nystatin   Topical BID  . polyethylene glycol  17 g Oral Daily  . rOPINIRole  2 mg Oral BID  . senna  2 tablet Oral QHS  . sodium chloride flush  3 mL Intravenous Q12H  . spironolactone  12.5 mg Oral Daily   Continuous Infusions:    LOS: 5 days    Time spent: 30 min    Kinnie Feil, MD [facility] Pager [phone]  If 7PM-7AM, please contact night-coverage [email or web address] Password [email or web address] 05/15/2016, 11:51 AM

## 2016-05-15 NOTE — Discharge Summary (Signed)
Physician Discharge Summary  Kathy Simpson [id number] DOB: 12-Jun-1951 DOA: 05/08/2016  PCP: [name], MD  Admit date: 05/08/2016 Discharge date: 05/15/2016  Time spent: >45 minutes  Recommendations for Outpatient Follow-up:  F/u with HHC, wound care at home F/u with surgery in 1-2 weeks F/u with PCP in 7-10 days as needed Discharge Diagnoses:  Principal Problem:   Wound infection ([place]) Active Problems:   OSA (obstructive sleep apnea)   Chronic systolic CHF (congestive heart failure) ([place])   Cellulitis   Hypertension   Diabetes mellitus without complication ([place])   Obesity   Hypothyroid   Sepsis ([place])   Discharge Condition:  Stable   Diet recommendation: DM, low sodium   Filed Weights   05/10/16 0522 05/11/16 2147 05/14/16 0500  Weight: 143.246 kg (315 lb 12.8 oz) 144.97 kg (319 lb 9.6 oz) 140.161 kg (309 lb)    History of present illness:   66 y.o. 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 She developed a blister in right hip after mechanical trauma 2 weeks prior to admission. It worsened to form a big wound with discharge, bleeding, and erythema to the area overnight. Pt was found to have WBC 12.6, lactate of 1. 60-->2.17, temperature 100.0, tachycardia, no tachypnea. Admitted with sepsis due to cellulitis, wound infection   Hospital Course:   1. epsis due to right hip wound infection and cellulitis s/p I&D on 6/14, pain, swelling, and erythema improving on antibiotic treatment post I&D.  -Wound PCR suggests MSSA, Blood cultures NGTD. Patient received IV ancef, then transitioned to oral atx at discharge. Cont wound vac, home health care. f/u with surgery in 1-2 weeks  2. OSA (obstructive sleep apnea): refused CPAP  3.  Chronic systolic CHF (congestive heart failure) ([place]): 2-D echo on 09/19/14 showed EF 30-35 percent. Patient is only taking spironolactone currently, not taking Lasix at home. No leg edema. CHF is compensated. -continue spironolactone, Continue aspirin   4. HTN, blood pressure elevated, Continue spironolactone  5. DM-II: Last A1c 8.4 on 10/29/14, poorly controlled. Continue invokana, Continue SSI+ lantus 10 units.   6. Hypothyroidism: Last TSH was 0.007 on 09/21/14. TSH again low (surgical panhypopit), but fT4 wnl. Continue home Synthroid  7. 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 Candidal yeast infection, received fluconazole x 1  9. Normocytic anemia, hemoglobin approximately stable near 8.8mg /dl. Defer iron studies due to active infection. Work up for anemia by PCP   Morbid obesity, recommend increased dietary fiber and decreased calories  Hypokalemia, oral potassium repletion. Resume spironolactone  Procedures:  I&D by surgery  (i.e. Studies not automatically included, echos, thoracentesis, etc; not x-rays)  Consultations:  Surgery   Discharge Exam: Filed Vitals:   05/15/16 0554 05/15/16 1357  BP: 164/50 153/61  Pulse: 73 80  Temp: 99.1 F (37.3 C) 97.9 F (36.6 C)  Resp: 18 17    General: alert, no distress  Cardiovascular: s1,s2 rrr Respiratory: CTA BL  Discharge Instructions  Discharge Instructions    Ambulatory referral to General Surgery    Complete by:  As directed      Diet - low sodium heart healthy    Complete by:  As directed      Discharge instructions    Complete by:  As directed  Please follow up with primary care doctor in 7-10 days as needed Please follow up with wound care     Increase activity slowly    Complete by:  As directed             Medication List    TAKE these medications        acetaminophen 500 MG tablet  Commonly known as:  TYLENOL  Take 1 tablet  (500 mg total) by mouth every 6 (six) hours as needed for mild pain or headache.     amoxicillin-clavulanate 875-125 MG tablet  Commonly known as:  AUGMENTIN  Take 1 tablet by mouth 2 (two) times daily.     aspirin EC 81 MG tablet  Take 81 mg by mouth daily with breakfast.     DESITIN 40 % ointment  Generic drug:  liver oil-zinc oxide  Apply 1 application topically as needed for irritation.     escitalopram 20 MG tablet  Commonly known as:  LEXAPRO  Take 20 mg by mouth daily.     furosemide 40 MG tablet  Commonly known as:  LASIX  Take 1 tablet (40 mg total) by mouth daily.     gabapentin 300 MG capsule  Commonly known as:  NEURONTIN  Take 600 mg by mouth 2 (two) times daily.     hydrocortisone 10 MG tablet  Commonly known as:  CORTEF  Take 10-20 mg by mouth 2 (two) times daily. 20mg  in the morning and 10mg  at bedtime     ibuprofen 800 MG tablet  Commonly known as:  ADVIL,MOTRIN  Take 800 mg by mouth every 8 (eight) hours as needed for moderate pain.     insulin aspart 100 UNIT/ML injection  Commonly known as:  novoLOG  Inject 0-9 Units into the skin 3 (three) times daily with meals.     insulin glargine 100 UNIT/ML injection  Commonly known as:  LANTUS  Inject 0.1 mLs (10 Units total) into the skin at bedtime.     JARDIANCE PO  Take 1 tablet by mouth daily.     levothyroxine 175 MCG tablet  Commonly known as:  SYNTHROID, LEVOTHROID  Take 175 mcg by mouth daily before breakfast.     oxyCODONE-acetaminophen 5-325 MG tablet  Commonly known as:  PERCOCET/ROXICET  Take 1 tablet by mouth every 4 (four) hours as needed for severe pain.     polyethylene glycol packet  Commonly known as:  MIRALAX / GLYCOLAX  Take 17 g by mouth daily as needed for mild constipation.     rOPINIRole 1 MG tablet  Commonly known as:  REQUIP  Take 2 mg by mouth 2 (two) times daily.     senna 8.6 MG Tabs tablet  Commonly known as:  SENOKOT  Take 2 tablets (17.2 mg total) by mouth at  bedtime.     spironolactone 25 MG tablet  Commonly known as:  ALDACTONE  Take 1 tablet (25 mg total) by mouth daily.       Allergies  Allergen Reactions  . Metformin And Related Diarrhea  . Actos [Pioglitazone] Other (See Comments)    Congestive heart   . Adhesive [Tape] Other (See Comments)    Pulls off skin  . Enalapril Swelling  . Livalo [Pitavastatin] Other (See Comments)    Makes leg muscles weak   . Maxzide [Hydrochlorothiazide W-Triamterene] Swelling  . Morphine And Related Itching and Other (See Comments)    Mood changes  . Statins Other (See Comments)  Makes legs numb   . Zetia [Ezetimibe] Swelling  . Prostate [Nutritional Supplements] Hives and Rash      The results of significant diagnostics from this hospitalization (including imaging, microbiology, ancillary and laboratory) are listed below for reference.    Significant Diagnostic Studies: Ct Abdomen Pelvis W Contrast  04/28/2016  CLINICAL DATA:  Abdominal pain and tenderness.  Nausea and vomiting. EXAM: CT ABDOMEN AND PELVIS WITH CONTRAST TECHNIQUE: Multidetector CT imaging of the abdomen and pelvis was performed using the standard protocol following bolus administration of intravenous contrast. CONTRAST:  100 cc Isovue-300 IV COMPARISON:  Radiographs yesterday FINDINGS: Lower chest: Linear atelectasis in the left lower lobe. The heart is enlarged. There are coronary artery calcifications. Liver: Enlarged with diffusely decreased density consistent with steatosis. Liver measures 24 cm cranial caudal. Hepatobiliary: Gallbladder physiologically distended, no calcified stone. No biliary dilatation. Pancreas: No ductal dilatation or inflammation. Spleen: Upper limits normal in size measuring 13.6 cm. Adrenal glands: No nodule. Kidneys: Symmetric renal enhancement. No hydronephrosis. Prominence of both renal pelvis in an extrarenal pelvis configuration. There is a 4.3 x 5.2 cm cyst in the interpolar right kidney posteriorly  with thin peripheral calcifications. Stomach/Bowel: Stomach physiologically distended. The small to moderate hiatal hernia. There are prominent small bowel loops in the left upper quadrant with mild fecalization of small bowel contents. No obstruction. No wall thickening or mesenteric edema. Moderate stool throughout the colon with multifocal colonic diverticulosis. No evidence of acute diverticulitis. Sigmoid colon is tortuous. The appendix is normal. Vascular/Lymphatic: No retroperitoneal adenopathy. Abdominal aorta is normal in caliber. Mild atherosclerosis of the abdominal aorta without aneurysm. Reproductive: Uterus and adnexa are normal for age. Bladder: Physiologically distended.  No wall thickening. Other: No free air, free fluid, or intra-abdominal fluid collection. Small fat containing umbilical hernia. Musculoskeletal: There are no acute or suspicious osseous abnormalities. There is posterior fusion L3 through L5 with interbody spacers. IMPRESSION: 1. Prominent small bowel loops on radiograph represented fecalized small bowel contents consistent with slow transit. No obstruction or bowel inflammation. 2. No acute abnormality in the abdomen/pelvis. 3. Colonic diverticulosis without diverticulitis. 4. Hepatomegaly and hepatic steatosis.  Borderline splenomegaly. 5. Bosniak type 2 right renal cyst. No specific follow-up is needed. Electronically Signed   By: Jeb Levering M.D.   On: 04/28/2016 01:32   Dg Chest Port 1 View  05/09/2016  CLINICAL DATA:  Sepsis.  Shortness of breath. EXAM: PORTABLE CHEST 1 VIEW COMPARISON:  04/27/2016 FINDINGS: Mild cardiac enlargement without vascular congestion. No focal airspace disease or consolidation in the lungs. No blunting of costophrenic angles. No pneumothorax. Calcified and tortuous aorta. Degenerative changes in the shoulders. IMPRESSION: Cardiac enlargement.  No evidence of active pulmonary disease. Electronically Signed   By: Lucienne Capers M.D.   On:  05/09/2016 01:46   Dg Abd Acute W/chest  04/27/2016  CLINICAL DATA:  Abdominal pain. EXAM: DG ABDOMEN ACUTE W/ 1V CHEST COMPARISON:  Chest radiographs 10/28/2014 FINDINGS: Mild cardiomegaly. Minimal interstitial prominence. No focal consolidation. No free intra-abdominal air. Mild gaseous gastric distention. Prominent small bowel loops in the left mid abdomen measuring 3.6 cm, nonspecific. Small volume of stool and air throughout the colon. Multiple pelvic phleboliths. Probable hepatic enlargement, difficult to assess due to divided technique secondary to habitus. Postsurgical change in the lumbar spine. Detailed evaluation limited by soft tissue attenuation from body habitus. IMPRESSION: 1. Prominent small bowel loops in the left mid abdomen, may reflect focal ileus, enteritis, or early small bowel obstruction could have this appearance.  2. Cardiomegaly.  No acute process in the chest. Electronically Signed   By: Jeb Levering M.D.   On: 04/27/2016 23:54    Microbiology: Recent Results (from the past 240 hour(s))  Blood Culture (routine x 2)     Status: None   Collection Time: 05/09/16  1:30 AM  Result Value Ref Range Status   Specimen Description BLOOD RIGHT ARM  Final   Special Requests BOTTLES DRAWN AEROBIC AND ANAEROBIC 5ML  Final   Culture NO GROWTH 5 DAYS  Final   Report Status 05/14/2016 FINAL  Final  Blood Culture (routine x 2)     Status: None   Collection Time: 05/09/16  1:55 AM  Result Value Ref Range Status   Specimen Description BLOOD LEFT ARM  Final   Special Requests IN PEDIATRIC BOTTLE 2ML  Final   Culture NO GROWTH 5 DAYS  Final   Report Status 05/14/2016 FINAL  Final  Urine culture     Status: None   Collection Time: 05/09/16  4:37 AM  Result Value Ref Range Status   Specimen Description URINE, CATHETERIZED  Final   Special Requests NONE  Final   Culture NO GROWTH  Final   Report Status 05/10/2016 FINAL  Final  Surgical pcr screen     Status: Abnormal   Collection  Time: 05/10/16 12:30 AM  Result Value Ref Range Status   MRSA, PCR NEGATIVE NEGATIVE Final   Staphylococcus aureus POSITIVE (A) NEGATIVE Final    Comment:        The Xpert SA Assay (FDA approved for NASAL specimens in patients over 19 years of age), is one component of a comprehensive surveillance program.  Test performance has been validated by [facility] for patients greater than or equal to 37 year old. It is not intended to diagnose infection nor to guide or monitor treatment.   Anaerobic culture     Status: None   Collection Time: 05/10/16 10:29 AM  Result Value Ref Range Status   Specimen Description WOUND RIGHT THIGH  Final   Special Requests PATIENT ON FOLLOWING VANCOMYCIN AND ZOSYN  Final   Gram Stain   Final    FEW WBC PRESENT,BOTH PMN AND MONONUCLEAR NO ORGANISMS SEEN    Culture NO ANAEROBES ISOLATED  Final   Report Status 05/15/2016 FINAL  Final  Aerobic Culture (superficial specimen)     Status: None   Collection Time: 05/10/16 10:29 AM  Result Value Ref Range Status   Specimen Description WOUND RIGHT THIGH  Final   Special Requests PATIENT ON FOLLOWING  VANCOMYCIN AND ZOSYN  Final   Gram Stain   Final    FEW WBC PRESENT,BOTH PMN AND MONONUCLEAR NO ORGANISMS SEEN    Culture   Final    RARE STAPHYLOCOCCUS AUREUS CRITICAL RESULT CALLED TO, READ BACK BY AND VERIFIED WITH: A MINTZ 05/11/16 @ [id number] M VESTAL    Report Status 05/13/2016 FINAL  Final   Organism ID, Bacteria STAPHYLOCOCCUS AUREUS  Final      Susceptibility   Staphylococcus aureus - MIC*    CIPROFLOXACIN >=8 RESISTANT Resistant     ERYTHROMYCIN >=8 RESISTANT Resistant     GENTAMICIN <=0.5 SENSITIVE Sensitive     OXACILLIN 0.5 SENSITIVE Sensitive     TETRACYCLINE <=1 SENSITIVE Sensitive     VANCOMYCIN <=0.5 SENSITIVE Sensitive     TRIMETH/SULFA <=10 SENSITIVE Sensitive     CLINDAMYCIN <=0.25 SENSITIVE Sensitive     RIFAMPIN <=0.5 SENSITIVE Sensitive     Inducible  Clindamycin NEGATIVE Sensitive      * RARE STAPHYLOCOCCUS AUREUS     Labs: Basic Metabolic Panel:  Recent Labs Lab 05/09/16 0441 05/10/16 1804 05/11/16 0642 05/12/16 0657 05/13/16 0556  NA 136 134* 139 139 139  K 3.3* 3.3* 3.2* 3.3* 3.3*  CL 101 99* 102 102 103  CO2 24 27 27 28 29   GLUCOSE 219* 268* 185* 195* 215*  BUN <5* <5* <5* <5* <5*  CREATININE 0.58 0.60 0.48 0.43* 0.51  CALCIUM 8.1* 7.9* 7.9* 8.3* 8.3*   Liver Function Tests:  Recent Labs Lab 05/08/16 1800  AST 11*  ALT 10*  ALKPHOS 68  BILITOT 0.6  PROT 7.6  ALBUMIN 2.4*   No results for input(s): LIPASE, AMYLASE in the last 168 hours. No results for input(s): AMMONIA in the last 168 hours. CBC:  Recent Labs Lab 05/08/16 1800 05/09/16 0441 05/11/16 0642 05/12/16 0657 05/13/16 0556  WBC 12.6* 8.5 5.0 6.0 6.1  NEUTROABS 9.7*  --  2.8 3.4 3.7  HGB 11.0* 10.0* 8.5* 9.0* 8.8*  HCT 35.5* 31.1* 28.2* 29.8* 29.1*  MCV 93.9 91.7 94.6 93.4 94.8  PLT 356 349 334 356 353   Cardiac Enzymes: No results for input(s): CKTOTAL, CKMB, CKMBINDEX, TROPONINI in the last 168 hours. BNP: BNP (last 3 results)  Recent Labs  05/09/16 0441  BNP 1320.8*    ProBNP (last 3 results) No results for input(s): PROBNP in the last 8760 hours.  CBG:  Recent Labs Lab 05/14/16 1216 05/14/16 1635 05/14/16 2156 05/15/16 0808 05/15/16 1152  GLUCAP 229* 184* 183* 131* 151*       Signed:  Asyria Kolander N  [facility] 05/15/2016, 2:17 PM

## 2016-05-15 NOTE — Consult Note (Signed)
WOC wound follow up Wound type: surgical  Measurement: 7cm x 6cm x 3.0cm x 3.5cm  Tunnel at 4 o'clock  Wound bed: clean, early granulation tissue, subcutaneous tissue. Some darkness at the 9 o'clock position not significant  Drainage (amount, consistency, odor) minimal, serosanguinous in canister  Periwound: hardened tissue but chronic, dependent tissue Dressing procedure/placement/frequency: 1pc of white foam used to fill the tunneled area, 1pc of black foam used to fill the remainder of the wound bed.  Draped and sealed at 150mmHG.  Patient tolerated well.  Patient hoping to DC home today.  Dr. Cher Nakai and Estill Bakes NP at bedside to assess wound today.   Notified CM of the need for home VAC unit and [facility] for dressing changes beginning Wednesday of this week if DC home today.    WOC will follow along as needed for M/W/F VAC changes.  Jairo Bellew Violet RN,CWOCN [id number]

## 2016-05-15 NOTE — Care Management Note (Signed)
Case Management Note  Patient Details  Name: Kathy Simpson MRN: [id number] Date of Birth: 1950/12/29  Subjective/Objective:             Admitted with infected R hip wound, s/p I & D  05/10/16.   Action/Plan: Plan is to d/c today with home health services wit wound vac. Expected Discharge Date:  05/15/16             Expected Discharge Plan:  [facility]  In-House Referral:     Discharge planning Services  CM Consult  Post Acute Care Choice:    Choice offered to:  Patient  DME Arranged:  Wound vac/ Lenna Sciara [phone] DME Agency:  [place] Arranged:  RN (wound vac)/ Jonelle Sidle [phone] [place]:  [place]  Status of Service:  Completed, signed off  Medicare Important Message Given:  Yes Date Medicare IM Given:    Medicare IM give by:    Date Additional Medicare IM Given:    Additional Medicare Important Message give by:     If discussed at [place] of Stay Meetings, dates discussed:    Additional Comments:  Sharin Mons, [place] [phone] 05/15/2016, 2:12 PM

## 2016-05-15 NOTE — Progress Notes (Signed)
PT Cancellation Note  Patient Details Name: Kathy Simpson MRN: [id number] DOB: 12/08/1950   Cancelled Treatment:    Reason Eval/Treat Not Completed: Patient declined, no reason specified Pt declined mobility. PT will check on pt later as time allows.    [place] April, PTA Pager: [phone]   05/15/2016, 12:06 PM

## 2016-05-16 LAB — HEMOGLOBIN A1C
Hgb A1c MFr Bld: 10.9 % — ABNORMAL HIGH (ref 4.8–5.6)
Mean Plasma Glucose: 266 mg/dL

## 2016-05-17 DIAGNOSIS — T798XXD Other early complications of trauma, subsequent encounter: Secondary | ICD-10-CM | POA: Diagnosis not present

## 2016-05-17 DIAGNOSIS — I10 Essential (primary) hypertension: Secondary | ICD-10-CM | POA: Diagnosis not present

## 2016-05-17 DIAGNOSIS — A419 Sepsis, unspecified organism: Secondary | ICD-10-CM | POA: Diagnosis not present

## 2016-05-17 DIAGNOSIS — E039 Hypothyroidism, unspecified: Secondary | ICD-10-CM | POA: Diagnosis not present

## 2016-05-17 DIAGNOSIS — L089 Local infection of the skin and subcutaneous tissue, unspecified: Secondary | ICD-10-CM | POA: Diagnosis not present

## 2016-05-17 DIAGNOSIS — L03115 Cellulitis of right lower limb: Secondary | ICD-10-CM | POA: Diagnosis not present

## 2016-05-17 DIAGNOSIS — I5022 Chronic systolic (congestive) heart failure: Secondary | ICD-10-CM | POA: Diagnosis not present

## 2016-05-17 DIAGNOSIS — G4733 Obstructive sleep apnea (adult) (pediatric): Secondary | ICD-10-CM | POA: Diagnosis not present

## 2016-05-17 DIAGNOSIS — T148 Other injury of unspecified body region: Secondary | ICD-10-CM | POA: Diagnosis not present

## 2016-05-17 DIAGNOSIS — E119 Type 2 diabetes mellitus without complications: Secondary | ICD-10-CM | POA: Diagnosis not present

## 2016-05-17 DIAGNOSIS — F329 Major depressive disorder, single episode, unspecified: Secondary | ICD-10-CM | POA: Diagnosis not present

## 2016-05-19 DIAGNOSIS — T798XXD Other early complications of trauma, subsequent encounter: Secondary | ICD-10-CM | POA: Diagnosis not present

## 2016-05-19 DIAGNOSIS — I5022 Chronic systolic (congestive) heart failure: Secondary | ICD-10-CM | POA: Diagnosis not present

## 2016-05-19 DIAGNOSIS — F329 Major depressive disorder, single episode, unspecified: Secondary | ICD-10-CM | POA: Diagnosis not present

## 2016-05-19 DIAGNOSIS — G4733 Obstructive sleep apnea (adult) (pediatric): Secondary | ICD-10-CM | POA: Diagnosis not present

## 2016-05-19 DIAGNOSIS — L03115 Cellulitis of right lower limb: Secondary | ICD-10-CM | POA: Diagnosis not present

## 2016-05-19 DIAGNOSIS — I10 Essential (primary) hypertension: Secondary | ICD-10-CM | POA: Diagnosis not present

## 2016-05-19 DIAGNOSIS — E119 Type 2 diabetes mellitus without complications: Secondary | ICD-10-CM | POA: Diagnosis not present

## 2016-05-19 DIAGNOSIS — E039 Hypothyroidism, unspecified: Secondary | ICD-10-CM | POA: Diagnosis not present

## 2016-05-22 DIAGNOSIS — I5022 Chronic systolic (congestive) heart failure: Secondary | ICD-10-CM | POA: Diagnosis not present

## 2016-05-22 DIAGNOSIS — E119 Type 2 diabetes mellitus without complications: Secondary | ICD-10-CM | POA: Diagnosis not present

## 2016-05-22 DIAGNOSIS — L03115 Cellulitis of right lower limb: Secondary | ICD-10-CM | POA: Diagnosis not present

## 2016-05-22 DIAGNOSIS — E039 Hypothyroidism, unspecified: Secondary | ICD-10-CM | POA: Diagnosis not present

## 2016-05-22 DIAGNOSIS — I10 Essential (primary) hypertension: Secondary | ICD-10-CM | POA: Diagnosis not present

## 2016-05-22 DIAGNOSIS — T798XXD Other early complications of trauma, subsequent encounter: Secondary | ICD-10-CM | POA: Diagnosis not present

## 2016-05-22 DIAGNOSIS — G4733 Obstructive sleep apnea (adult) (pediatric): Secondary | ICD-10-CM | POA: Diagnosis not present

## 2016-05-22 DIAGNOSIS — F329 Major depressive disorder, single episode, unspecified: Secondary | ICD-10-CM | POA: Diagnosis not present

## 2016-05-24 DIAGNOSIS — T798XXD Other early complications of trauma, subsequent encounter: Secondary | ICD-10-CM | POA: Diagnosis not present

## 2016-05-24 DIAGNOSIS — L03115 Cellulitis of right lower limb: Secondary | ICD-10-CM | POA: Diagnosis not present

## 2016-05-24 DIAGNOSIS — F329 Major depressive disorder, single episode, unspecified: Secondary | ICD-10-CM | POA: Diagnosis not present

## 2016-05-24 DIAGNOSIS — E039 Hypothyroidism, unspecified: Secondary | ICD-10-CM | POA: Diagnosis not present

## 2016-05-24 DIAGNOSIS — E119 Type 2 diabetes mellitus without complications: Secondary | ICD-10-CM | POA: Diagnosis not present

## 2016-05-24 DIAGNOSIS — G4733 Obstructive sleep apnea (adult) (pediatric): Secondary | ICD-10-CM | POA: Diagnosis not present

## 2016-05-24 DIAGNOSIS — I5022 Chronic systolic (congestive) heart failure: Secondary | ICD-10-CM | POA: Diagnosis not present

## 2016-05-24 DIAGNOSIS — I10 Essential (primary) hypertension: Secondary | ICD-10-CM | POA: Diagnosis not present

## 2016-05-26 DIAGNOSIS — I5022 Chronic systolic (congestive) heart failure: Secondary | ICD-10-CM | POA: Diagnosis not present

## 2016-05-26 DIAGNOSIS — E039 Hypothyroidism, unspecified: Secondary | ICD-10-CM | POA: Diagnosis not present

## 2016-05-26 DIAGNOSIS — T798XXD Other early complications of trauma, subsequent encounter: Secondary | ICD-10-CM | POA: Diagnosis not present

## 2016-05-26 DIAGNOSIS — G4733 Obstructive sleep apnea (adult) (pediatric): Secondary | ICD-10-CM | POA: Diagnosis not present

## 2016-05-26 DIAGNOSIS — I10 Essential (primary) hypertension: Secondary | ICD-10-CM | POA: Diagnosis not present

## 2016-05-26 DIAGNOSIS — F329 Major depressive disorder, single episode, unspecified: Secondary | ICD-10-CM | POA: Diagnosis not present

## 2016-05-26 DIAGNOSIS — E119 Type 2 diabetes mellitus without complications: Secondary | ICD-10-CM | POA: Diagnosis not present

## 2016-05-26 DIAGNOSIS — L03115 Cellulitis of right lower limb: Secondary | ICD-10-CM | POA: Diagnosis not present

## 2016-05-29 DIAGNOSIS — I10 Essential (primary) hypertension: Secondary | ICD-10-CM | POA: Diagnosis not present

## 2016-05-29 DIAGNOSIS — E039 Hypothyroidism, unspecified: Secondary | ICD-10-CM | POA: Diagnosis not present

## 2016-05-29 DIAGNOSIS — T798XXD Other early complications of trauma, subsequent encounter: Secondary | ICD-10-CM | POA: Diagnosis not present

## 2016-05-29 DIAGNOSIS — A419 Sepsis, unspecified organism: Secondary | ICD-10-CM | POA: Diagnosis not present

## 2016-05-29 DIAGNOSIS — G4733 Obstructive sleep apnea (adult) (pediatric): Secondary | ICD-10-CM | POA: Diagnosis not present

## 2016-05-29 DIAGNOSIS — L089 Local infection of the skin and subcutaneous tissue, unspecified: Secondary | ICD-10-CM | POA: Diagnosis not present

## 2016-05-29 DIAGNOSIS — L03115 Cellulitis of right lower limb: Secondary | ICD-10-CM | POA: Diagnosis not present

## 2016-05-29 DIAGNOSIS — T148 Other injury of unspecified body region: Secondary | ICD-10-CM | POA: Diagnosis not present

## 2016-05-29 DIAGNOSIS — F329 Major depressive disorder, single episode, unspecified: Secondary | ICD-10-CM | POA: Diagnosis not present

## 2016-05-29 DIAGNOSIS — I5022 Chronic systolic (congestive) heart failure: Secondary | ICD-10-CM | POA: Diagnosis not present

## 2016-05-29 DIAGNOSIS — E119 Type 2 diabetes mellitus without complications: Secondary | ICD-10-CM | POA: Diagnosis not present

## 2016-06-01 DIAGNOSIS — I5022 Chronic systolic (congestive) heart failure: Secondary | ICD-10-CM | POA: Diagnosis not present

## 2016-06-01 DIAGNOSIS — E119 Type 2 diabetes mellitus without complications: Secondary | ICD-10-CM | POA: Diagnosis not present

## 2016-06-01 DIAGNOSIS — F329 Major depressive disorder, single episode, unspecified: Secondary | ICD-10-CM | POA: Diagnosis not present

## 2016-06-01 DIAGNOSIS — E039 Hypothyroidism, unspecified: Secondary | ICD-10-CM | POA: Diagnosis not present

## 2016-06-01 DIAGNOSIS — L03115 Cellulitis of right lower limb: Secondary | ICD-10-CM | POA: Diagnosis not present

## 2016-06-01 DIAGNOSIS — T798XXD Other early complications of trauma, subsequent encounter: Secondary | ICD-10-CM | POA: Diagnosis not present

## 2016-06-01 DIAGNOSIS — G4733 Obstructive sleep apnea (adult) (pediatric): Secondary | ICD-10-CM | POA: Diagnosis not present

## 2016-06-01 DIAGNOSIS — I10 Essential (primary) hypertension: Secondary | ICD-10-CM | POA: Diagnosis not present

## 2016-06-02 DIAGNOSIS — E039 Hypothyroidism, unspecified: Secondary | ICD-10-CM | POA: Diagnosis not present

## 2016-06-02 DIAGNOSIS — I10 Essential (primary) hypertension: Secondary | ICD-10-CM | POA: Diagnosis not present

## 2016-06-02 DIAGNOSIS — E119 Type 2 diabetes mellitus without complications: Secondary | ICD-10-CM | POA: Diagnosis not present

## 2016-06-02 DIAGNOSIS — I5022 Chronic systolic (congestive) heart failure: Secondary | ICD-10-CM | POA: Diagnosis not present

## 2016-06-02 DIAGNOSIS — T798XXD Other early complications of trauma, subsequent encounter: Secondary | ICD-10-CM | POA: Diagnosis not present

## 2016-06-02 DIAGNOSIS — F329 Major depressive disorder, single episode, unspecified: Secondary | ICD-10-CM | POA: Diagnosis not present

## 2016-06-02 DIAGNOSIS — G4733 Obstructive sleep apnea (adult) (pediatric): Secondary | ICD-10-CM | POA: Diagnosis not present

## 2016-06-02 DIAGNOSIS — L03115 Cellulitis of right lower limb: Secondary | ICD-10-CM | POA: Diagnosis not present

## 2016-06-05 DIAGNOSIS — I10 Essential (primary) hypertension: Secondary | ICD-10-CM | POA: Diagnosis not present

## 2016-06-05 DIAGNOSIS — I5022 Chronic systolic (congestive) heart failure: Secondary | ICD-10-CM | POA: Diagnosis not present

## 2016-06-05 DIAGNOSIS — T798XXD Other early complications of trauma, subsequent encounter: Secondary | ICD-10-CM | POA: Diagnosis not present

## 2016-06-05 DIAGNOSIS — E039 Hypothyroidism, unspecified: Secondary | ICD-10-CM | POA: Diagnosis not present

## 2016-06-05 DIAGNOSIS — F329 Major depressive disorder, single episode, unspecified: Secondary | ICD-10-CM | POA: Diagnosis not present

## 2016-06-05 DIAGNOSIS — G4733 Obstructive sleep apnea (adult) (pediatric): Secondary | ICD-10-CM | POA: Diagnosis not present

## 2016-06-05 DIAGNOSIS — E119 Type 2 diabetes mellitus without complications: Secondary | ICD-10-CM | POA: Diagnosis not present

## 2016-06-05 DIAGNOSIS — L03115 Cellulitis of right lower limb: Secondary | ICD-10-CM | POA: Diagnosis not present

## 2016-06-07 DIAGNOSIS — G4733 Obstructive sleep apnea (adult) (pediatric): Secondary | ICD-10-CM | POA: Diagnosis not present

## 2016-06-07 DIAGNOSIS — L03115 Cellulitis of right lower limb: Secondary | ICD-10-CM | POA: Diagnosis not present

## 2016-06-07 DIAGNOSIS — I5022 Chronic systolic (congestive) heart failure: Secondary | ICD-10-CM | POA: Diagnosis not present

## 2016-06-07 DIAGNOSIS — F329 Major depressive disorder, single episode, unspecified: Secondary | ICD-10-CM | POA: Diagnosis not present

## 2016-06-07 DIAGNOSIS — I10 Essential (primary) hypertension: Secondary | ICD-10-CM | POA: Diagnosis not present

## 2016-06-07 DIAGNOSIS — T798XXD Other early complications of trauma, subsequent encounter: Secondary | ICD-10-CM | POA: Diagnosis not present

## 2016-06-07 DIAGNOSIS — E119 Type 2 diabetes mellitus without complications: Secondary | ICD-10-CM | POA: Diagnosis not present

## 2016-06-07 DIAGNOSIS — E039 Hypothyroidism, unspecified: Secondary | ICD-10-CM | POA: Diagnosis not present

## 2016-06-15 DIAGNOSIS — T798XXD Other early complications of trauma, subsequent encounter: Secondary | ICD-10-CM | POA: Diagnosis not present

## 2016-06-15 DIAGNOSIS — I10 Essential (primary) hypertension: Secondary | ICD-10-CM | POA: Diagnosis not present

## 2016-06-15 DIAGNOSIS — G4733 Obstructive sleep apnea (adult) (pediatric): Secondary | ICD-10-CM | POA: Diagnosis not present

## 2016-06-15 DIAGNOSIS — L03115 Cellulitis of right lower limb: Secondary | ICD-10-CM | POA: Diagnosis not present

## 2016-06-15 DIAGNOSIS — E039 Hypothyroidism, unspecified: Secondary | ICD-10-CM | POA: Diagnosis not present

## 2016-06-15 DIAGNOSIS — I5022 Chronic systolic (congestive) heart failure: Secondary | ICD-10-CM | POA: Diagnosis not present

## 2016-06-15 DIAGNOSIS — E119 Type 2 diabetes mellitus without complications: Secondary | ICD-10-CM | POA: Diagnosis not present

## 2016-06-15 DIAGNOSIS — F329 Major depressive disorder, single episode, unspecified: Secondary | ICD-10-CM | POA: Diagnosis not present

## 2016-06-22 DIAGNOSIS — I5022 Chronic systolic (congestive) heart failure: Secondary | ICD-10-CM | POA: Diagnosis not present

## 2016-06-22 DIAGNOSIS — T798XXD Other early complications of trauma, subsequent encounter: Secondary | ICD-10-CM | POA: Diagnosis not present

## 2016-06-22 DIAGNOSIS — G4733 Obstructive sleep apnea (adult) (pediatric): Secondary | ICD-10-CM | POA: Diagnosis not present

## 2016-06-22 DIAGNOSIS — F329 Major depressive disorder, single episode, unspecified: Secondary | ICD-10-CM | POA: Diagnosis not present

## 2016-06-22 DIAGNOSIS — I10 Essential (primary) hypertension: Secondary | ICD-10-CM | POA: Diagnosis not present

## 2016-06-22 DIAGNOSIS — E039 Hypothyroidism, unspecified: Secondary | ICD-10-CM | POA: Diagnosis not present

## 2016-06-22 DIAGNOSIS — E119 Type 2 diabetes mellitus without complications: Secondary | ICD-10-CM | POA: Diagnosis not present

## 2016-06-22 DIAGNOSIS — L03115 Cellulitis of right lower limb: Secondary | ICD-10-CM | POA: Diagnosis not present

## 2016-06-29 DIAGNOSIS — E119 Type 2 diabetes mellitus without complications: Secondary | ICD-10-CM | POA: Diagnosis not present

## 2016-06-29 DIAGNOSIS — E039 Hypothyroidism, unspecified: Secondary | ICD-10-CM | POA: Diagnosis not present

## 2016-06-29 DIAGNOSIS — G4733 Obstructive sleep apnea (adult) (pediatric): Secondary | ICD-10-CM | POA: Diagnosis not present

## 2016-06-29 DIAGNOSIS — F329 Major depressive disorder, single episode, unspecified: Secondary | ICD-10-CM | POA: Diagnosis not present

## 2016-06-29 DIAGNOSIS — I5022 Chronic systolic (congestive) heart failure: Secondary | ICD-10-CM | POA: Diagnosis not present

## 2016-06-29 DIAGNOSIS — T798XXD Other early complications of trauma, subsequent encounter: Secondary | ICD-10-CM | POA: Diagnosis not present

## 2016-06-29 DIAGNOSIS — I10 Essential (primary) hypertension: Secondary | ICD-10-CM | POA: Diagnosis not present

## 2016-06-29 DIAGNOSIS — L03115 Cellulitis of right lower limb: Secondary | ICD-10-CM | POA: Diagnosis not present

## 2016-07-10 DIAGNOSIS — Z1211 Encounter for screening for malignant neoplasm of colon: Secondary | ICD-10-CM | POA: Diagnosis not present

## 2016-07-10 DIAGNOSIS — I10 Essential (primary) hypertension: Secondary | ICD-10-CM | POA: Diagnosis not present

## 2016-07-10 DIAGNOSIS — E236 Other disorders of pituitary gland: Secondary | ICD-10-CM | POA: Diagnosis not present

## 2016-07-10 DIAGNOSIS — Z23 Encounter for immunization: Secondary | ICD-10-CM | POA: Diagnosis not present

## 2016-07-10 DIAGNOSIS — D696 Thrombocytopenia, unspecified: Secondary | ICD-10-CM | POA: Diagnosis not present

## 2016-07-10 DIAGNOSIS — E099 Drug or chemical induced diabetes mellitus without complications: Secondary | ICD-10-CM | POA: Diagnosis not present

## 2016-07-10 DIAGNOSIS — Z794 Long term (current) use of insulin: Secondary | ICD-10-CM | POA: Diagnosis not present

## 2016-07-10 DIAGNOSIS — Z0000 Encounter for general adult medical examination without abnormal findings: Secondary | ICD-10-CM | POA: Diagnosis not present

## 2016-07-10 DIAGNOSIS — Z6841 Body Mass Index (BMI) 40.0 and over, adult: Secondary | ICD-10-CM | POA: Diagnosis not present

## 2016-07-10 DIAGNOSIS — E876 Hypokalemia: Secondary | ICD-10-CM | POA: Diagnosis not present

## 2016-07-10 DIAGNOSIS — Z1389 Encounter for screening for other disorder: Secondary | ICD-10-CM | POA: Diagnosis not present

## 2016-07-10 DIAGNOSIS — F4320 Adjustment disorder, unspecified: Secondary | ICD-10-CM | POA: Diagnosis not present

## 2016-07-10 DIAGNOSIS — Z7189 Other specified counseling: Secondary | ICD-10-CM | POA: Diagnosis not present

## 2016-07-12 DIAGNOSIS — I5022 Chronic systolic (congestive) heart failure: Secondary | ICD-10-CM | POA: Diagnosis not present

## 2016-07-12 DIAGNOSIS — E039 Hypothyroidism, unspecified: Secondary | ICD-10-CM | POA: Diagnosis not present

## 2016-07-12 DIAGNOSIS — F329 Major depressive disorder, single episode, unspecified: Secondary | ICD-10-CM | POA: Diagnosis not present

## 2016-07-12 DIAGNOSIS — I10 Essential (primary) hypertension: Secondary | ICD-10-CM | POA: Diagnosis not present

## 2016-07-12 DIAGNOSIS — E119 Type 2 diabetes mellitus without complications: Secondary | ICD-10-CM | POA: Diagnosis not present

## 2016-07-12 DIAGNOSIS — M25561 Pain in right knee: Secondary | ICD-10-CM | POA: Diagnosis not present

## 2016-07-12 DIAGNOSIS — G4733 Obstructive sleep apnea (adult) (pediatric): Secondary | ICD-10-CM | POA: Diagnosis not present

## 2016-07-12 DIAGNOSIS — T798XXD Other early complications of trauma, subsequent encounter: Secondary | ICD-10-CM | POA: Diagnosis not present

## 2016-07-12 DIAGNOSIS — M25562 Pain in left knee: Secondary | ICD-10-CM | POA: Diagnosis not present

## 2016-07-12 DIAGNOSIS — L03115 Cellulitis of right lower limb: Secondary | ICD-10-CM | POA: Diagnosis not present

## 2016-08-22 DIAGNOSIS — Z23 Encounter for immunization: Secondary | ICD-10-CM | POA: Diagnosis not present

## 2016-08-22 DIAGNOSIS — E236 Other disorders of pituitary gland: Secondary | ICD-10-CM | POA: Diagnosis not present

## 2016-08-22 DIAGNOSIS — E099 Drug or chemical induced diabetes mellitus without complications: Secondary | ICD-10-CM | POA: Diagnosis not present

## 2016-08-22 DIAGNOSIS — Z794 Long term (current) use of insulin: Secondary | ICD-10-CM | POA: Diagnosis not present

## 2016-12-05 DIAGNOSIS — E236 Other disorders of pituitary gland: Secondary | ICD-10-CM | POA: Diagnosis not present

## 2016-12-05 DIAGNOSIS — Z794 Long term (current) use of insulin: Secondary | ICD-10-CM | POA: Diagnosis not present

## 2016-12-05 DIAGNOSIS — E099 Drug or chemical induced diabetes mellitus without complications: Secondary | ICD-10-CM | POA: Diagnosis not present

## 2017-01-08 DIAGNOSIS — M170 Bilateral primary osteoarthritis of knee: Secondary | ICD-10-CM | POA: Diagnosis not present

## 2017-02-01 DIAGNOSIS — I10 Essential (primary) hypertension: Secondary | ICD-10-CM | POA: Diagnosis not present

## 2017-03-08 DIAGNOSIS — Z794 Long term (current) use of insulin: Secondary | ICD-10-CM | POA: Diagnosis not present

## 2017-03-08 DIAGNOSIS — B373 Candidiasis of vulva and vagina: Secondary | ICD-10-CM | POA: Diagnosis not present

## 2017-03-08 DIAGNOSIS — E236 Other disorders of pituitary gland: Secondary | ICD-10-CM | POA: Diagnosis not present

## 2017-03-08 DIAGNOSIS — E099 Drug or chemical induced diabetes mellitus without complications: Secondary | ICD-10-CM | POA: Diagnosis not present

## 2017-04-11 ENCOUNTER — Other Ambulatory Visit: Payer: Self-pay | Admitting: Internal Medicine

## 2017-04-11 DIAGNOSIS — E236 Other disorders of pituitary gland: Secondary | ICD-10-CM | POA: Diagnosis not present

## 2017-04-11 DIAGNOSIS — Z794 Long term (current) use of insulin: Secondary | ICD-10-CM | POA: Diagnosis not present

## 2017-04-11 DIAGNOSIS — E099 Drug or chemical induced diabetes mellitus without complications: Secondary | ICD-10-CM | POA: Diagnosis not present

## 2017-04-11 DIAGNOSIS — R946 Abnormal results of thyroid function studies: Secondary | ICD-10-CM

## 2017-04-19 ENCOUNTER — Ambulatory Visit
Admission: RE | Admit: 2017-04-19 | Discharge: 2017-04-19 | Disposition: A | Discharge status: Home / Self Care | Payer: Medicare HMO | Source: Ambulatory Visit | Attending: Internal Medicine | Admitting: Internal Medicine

## 2017-04-19 DIAGNOSIS — E041 Nontoxic single thyroid nodule: Secondary | ICD-10-CM | POA: Diagnosis not present

## 2017-04-19 DIAGNOSIS — R946 Abnormal results of thyroid function studies: Secondary | ICD-10-CM

## 2017-04-24 ENCOUNTER — Other Ambulatory Visit: Payer: Self-pay | Admitting: Internal Medicine

## 2017-04-24 DIAGNOSIS — E041 Nontoxic single thyroid nodule: Secondary | ICD-10-CM

## 2017-05-02 ENCOUNTER — Other Ambulatory Visit: Payer: Medicare HMO

## 2017-05-03 ENCOUNTER — Ambulatory Visit
Admission: RE | Admit: 2017-05-03 | Discharge: 2017-05-03 | Disposition: A | Discharge status: Home / Self Care | Payer: Medicare HMO | Source: Ambulatory Visit | Attending: Internal Medicine | Admitting: Internal Medicine

## 2017-05-03 ENCOUNTER — Other Ambulatory Visit (HOSPITAL_COMMUNITY)
Admission: RE | Admit: 2017-05-03 | Discharge: 2017-05-03 | Disposition: A | Discharge status: Home / Self Care | Payer: Medicare HMO | Source: Ambulatory Visit | Attending: Radiology | Admitting: Radiology

## 2017-05-03 DIAGNOSIS — E041 Nontoxic single thyroid nodule: Secondary | ICD-10-CM | POA: Insufficient documentation

## 2017-05-10 ENCOUNTER — Other Ambulatory Visit: Payer: Self-pay | Admitting: Internal Medicine

## 2017-05-10 DIAGNOSIS — E041 Nontoxic single thyroid nodule: Secondary | ICD-10-CM

## 2017-05-21 DIAGNOSIS — M25561 Pain in right knee: Secondary | ICD-10-CM | POA: Diagnosis not present

## 2017-05-21 DIAGNOSIS — M25562 Pain in left knee: Secondary | ICD-10-CM | POA: Diagnosis not present

## 2017-06-01 DIAGNOSIS — E119 Type 2 diabetes mellitus without complications: Secondary | ICD-10-CM | POA: Diagnosis not present

## 2017-06-01 DIAGNOSIS — H43812 Vitreous degeneration, left eye: Secondary | ICD-10-CM | POA: Diagnosis not present

## 2017-06-01 DIAGNOSIS — H2513 Age-related nuclear cataract, bilateral: Secondary | ICD-10-CM | POA: Diagnosis not present

## 2017-06-12 DIAGNOSIS — B372 Candidiasis of skin and nail: Secondary | ICD-10-CM | POA: Diagnosis not present

## 2017-06-12 DIAGNOSIS — L03115 Cellulitis of right lower limb: Secondary | ICD-10-CM | POA: Diagnosis not present

## 2017-06-14 DIAGNOSIS — L03115 Cellulitis of right lower limb: Secondary | ICD-10-CM | POA: Diagnosis not present

## 2017-06-19 DIAGNOSIS — E099 Drug or chemical induced diabetes mellitus without complications: Secondary | ICD-10-CM | POA: Diagnosis not present

## 2017-06-19 DIAGNOSIS — E041 Nontoxic single thyroid nodule: Secondary | ICD-10-CM | POA: Diagnosis not present

## 2017-06-19 DIAGNOSIS — Z794 Long term (current) use of insulin: Secondary | ICD-10-CM | POA: Diagnosis not present

## 2017-06-19 DIAGNOSIS — E236 Other disorders of pituitary gland: Secondary | ICD-10-CM | POA: Diagnosis not present

## 2017-07-17 DIAGNOSIS — Z0000 Encounter for general adult medical examination without abnormal findings: Secondary | ICD-10-CM | POA: Diagnosis not present

## 2017-07-17 DIAGNOSIS — Z1211 Encounter for screening for malignant neoplasm of colon: Secondary | ICD-10-CM | POA: Diagnosis not present

## 2017-07-17 DIAGNOSIS — F324 Major depressive disorder, single episode, in partial remission: Secondary | ICD-10-CM | POA: Diagnosis not present

## 2017-07-17 DIAGNOSIS — I10 Essential (primary) hypertension: Secondary | ICD-10-CM | POA: Diagnosis not present

## 2017-07-17 DIAGNOSIS — F4320 Adjustment disorder, unspecified: Secondary | ICD-10-CM | POA: Diagnosis not present

## 2017-07-17 DIAGNOSIS — E782 Mixed hyperlipidemia: Secondary | ICD-10-CM | POA: Diagnosis not present

## 2017-07-17 DIAGNOSIS — Z6841 Body Mass Index (BMI) 40.0 and over, adult: Secondary | ICD-10-CM | POA: Diagnosis not present

## 2017-07-17 DIAGNOSIS — R609 Edema, unspecified: Secondary | ICD-10-CM | POA: Diagnosis not present

## 2017-07-17 DIAGNOSIS — G2581 Restless legs syndrome: Secondary | ICD-10-CM | POA: Diagnosis not present

## 2017-07-17 DIAGNOSIS — F325 Major depressive disorder, single episode, in full remission: Secondary | ICD-10-CM | POA: Diagnosis not present

## 2017-09-07 IMAGING — CT CT ABD-PELV W/ CM
2 of 5 series · 16 of 46 positions shown, 18 images · IV contrast (APPLIED)
Comparison: Radiographs yesterday

CLINICAL DATA: Abdominal pain and tenderness.  Nausea and vomiting.

EXAM:
CT ABDOMEN AND PELVIS WITH CONTRAST
TECHNIQUE: Multidetector CT imaging of the abdomen and pelvis was performed
using the standard protocol following bolus administration of
intravenous contrast.
CONTRAST:  100 cc Ssovue-AFF IV

[Series 2: abd/ pelvis 5.0 i30f 1 · axial · 0.98mm/px · z∈[-725,-285]mm · 13 of 100 slices shown, 15 images]
[im 6/100  soft-tissue]
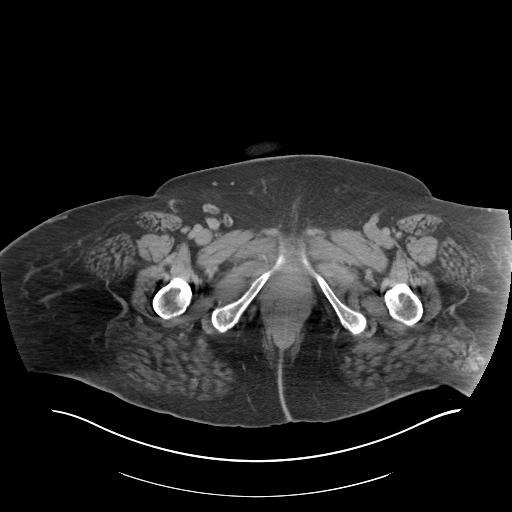
[im 6/100  bone]
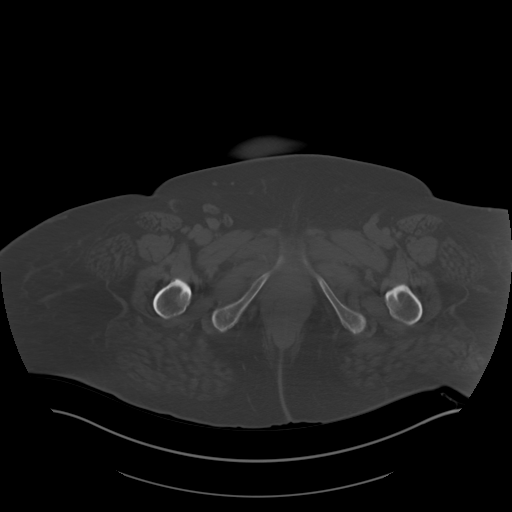
[im 12/100  soft-tissue]
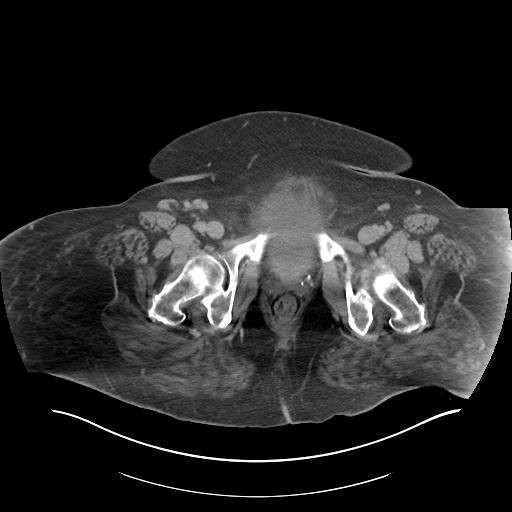
[im 23/100  soft-tissue]
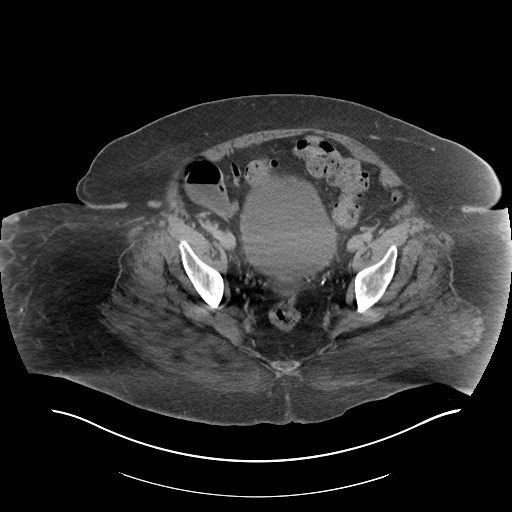
[im 28/100  soft-tissue]
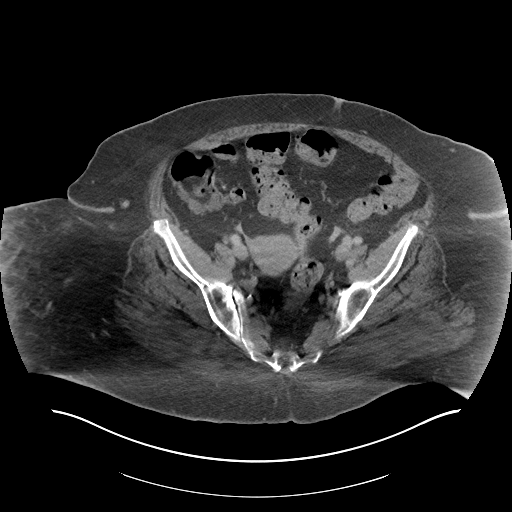
[im 34/100  soft-tissue]
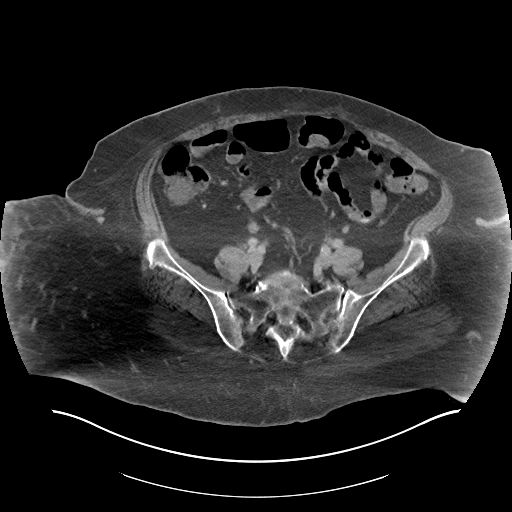
[im 45/100  soft-tissue]
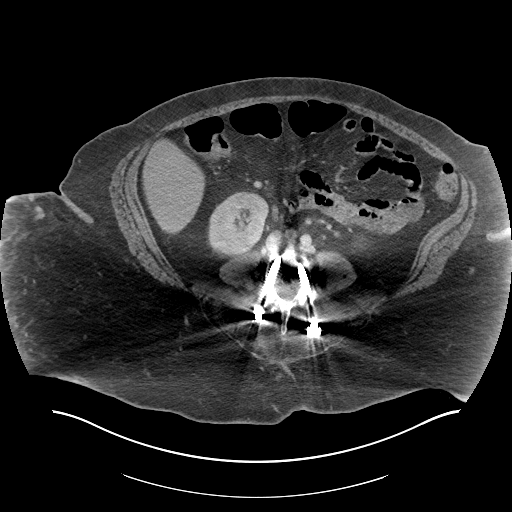
[im 50/100  soft-tissue]
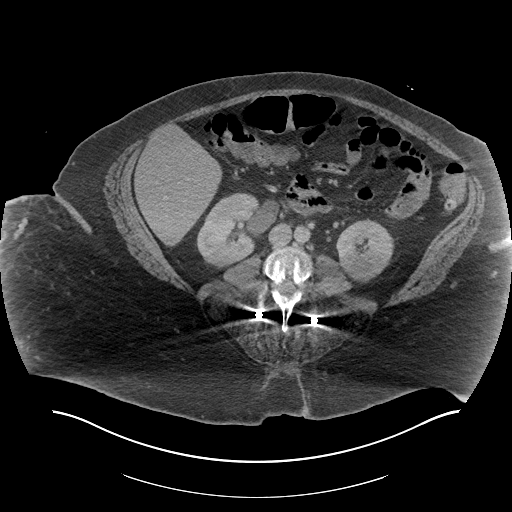
[im 56/100  soft-tissue]
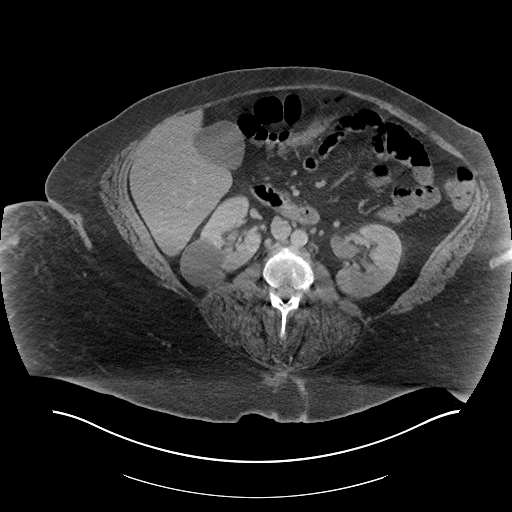
[im 67/100  soft-tissue]
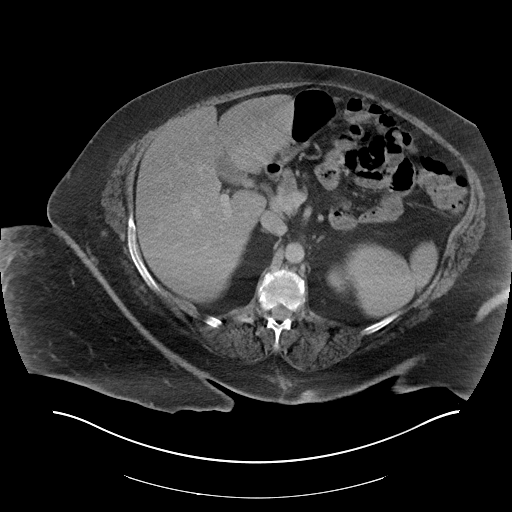
[im 67/100  bone]
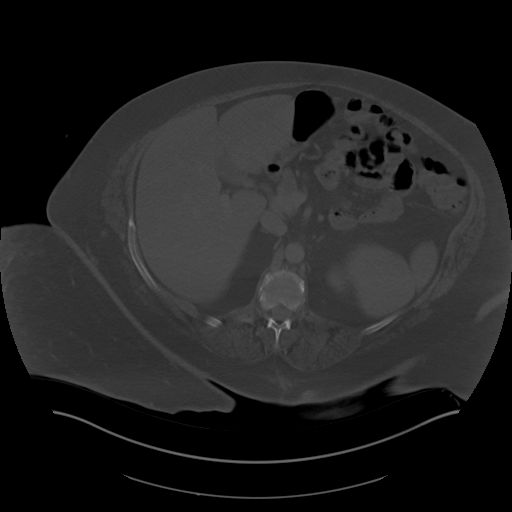
[im 72/100  soft-tissue]
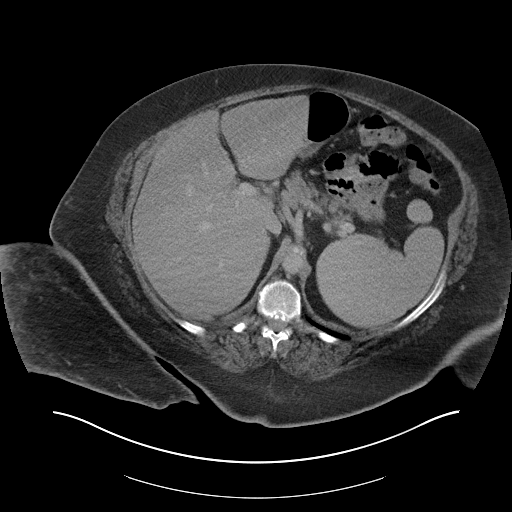
[im 78/100  soft-tissue]
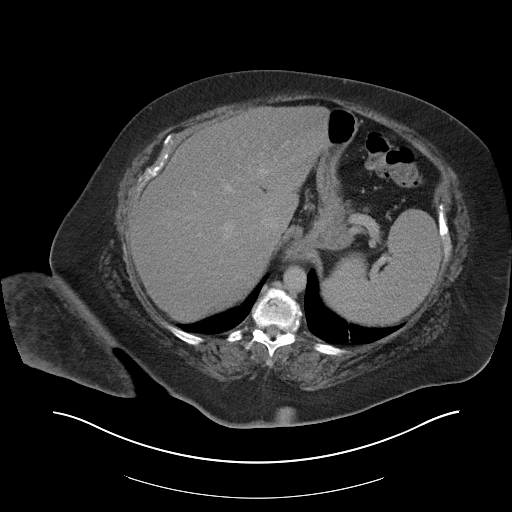
[im 89/100  soft-tissue]
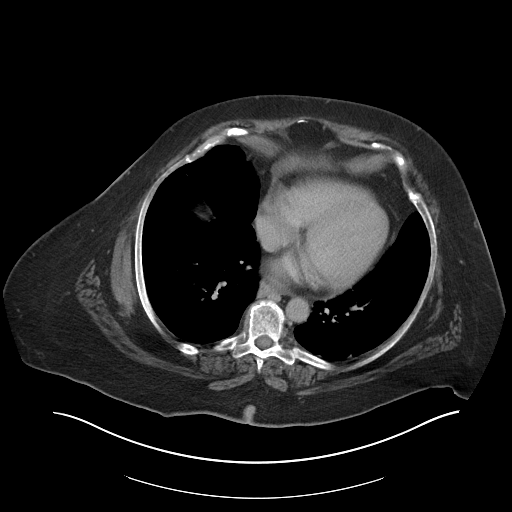
[im 94/100  soft-tissue]
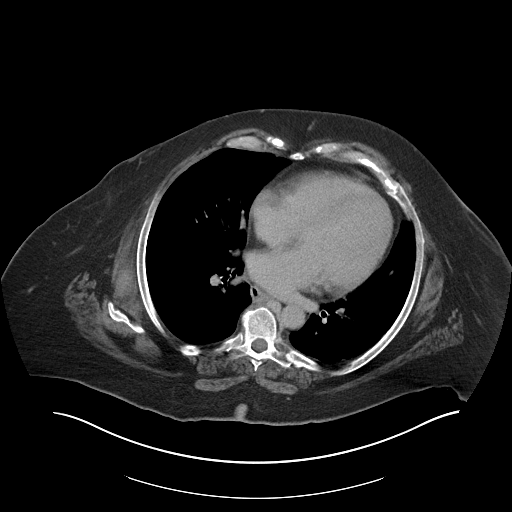

[Series 5: coronal soft tissue · coronal · 0.98mm/px · 3 of 108 slices shown]
[im 36/108  soft-tissue]
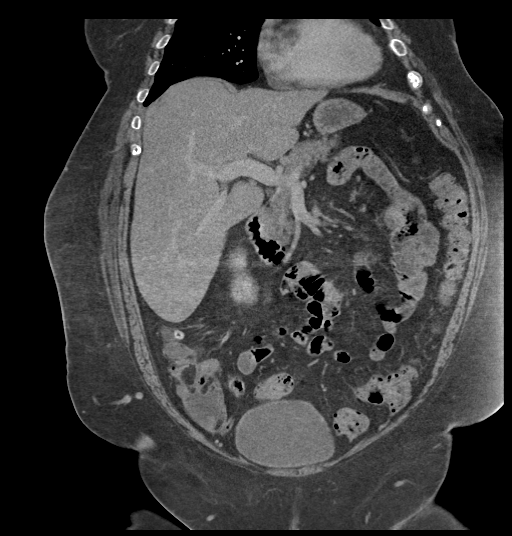
[im 48/108  soft-tissue]
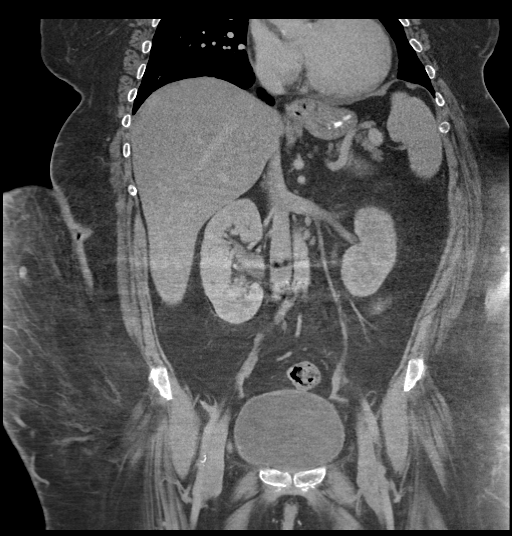
[im 60/108  soft-tissue]
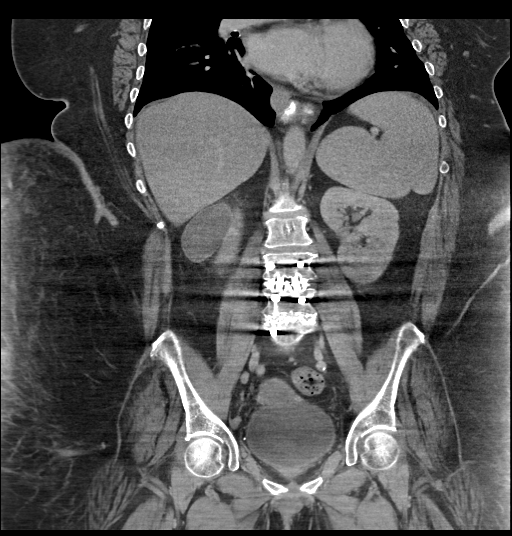

[16 of 46 positions shown; findings below may reference images not displayed]

FINDINGS: Lower chest: Linear atelectasis in the left lower lobe. The heart is
enlarged. There are coronary artery calcifications.

Liver: Enlarged with diffusely decreased density consistent with
steatosis. Liver measures 24 cm cranial caudal.

Hepatobiliary: Gallbladder physiologically distended, no calcified
stone. No biliary dilatation.

Pancreas: No ductal dilatation or inflammation.

Spleen: Upper limits normal in size measuring 13.6 cm.

Adrenal glands: No nodule.

Kidneys: Symmetric renal enhancement. No hydronephrosis. Prominence
of both renal pelvis in an extrarenal pelvis configuration. There is
a 4.3 x 5.2 cm cyst in the interpolar right kidney posteriorly with
thin peripheral calcifications.

Stomach/Bowel: Stomach physiologically distended. The small to
moderate hiatal hernia. There are prominent small bowel loops in the
left upper quadrant with mild fecalization of small bowel contents.
No obstruction. No wall thickening or mesenteric edema. Moderate
stool throughout the colon with multifocal colonic diverticulosis.
No evidence of acute diverticulitis. Sigmoid colon is tortuous. The
appendix is normal.

Vascular/Lymphatic: No retroperitoneal adenopathy. Abdominal aorta
is normal in caliber. Mild atherosclerosis of the abdominal aorta
without aneurysm.

Reproductive: Uterus and adnexa are normal for age.

Bladder: Physiologically distended.  No wall thickening.

Other: No free air, free fluid, or intra-abdominal fluid collection.
Small fat containing umbilical hernia.

Musculoskeletal: There are no acute or suspicious osseous
abnormalities. There is posterior fusion L3 through L5 with
interbody spacers.
IMPRESSION: 1. Prominent small bowel loops on radiograph represented fecalized
small bowel contents consistent with slow transit. No obstruction or
bowel inflammation.
2. No acute abnormality in the abdomen/pelvis.
3. Colonic diverticulosis without diverticulitis.
4. Hepatomegaly and hepatic steatosis.  Borderline splenomegaly.
5. Bosniak type 2 right renal cyst. No specific follow-up is needed.

## 2017-09-27 ENCOUNTER — Other Ambulatory Visit: Payer: Self-pay | Admitting: Internal Medicine

## 2017-09-27 DIAGNOSIS — Z794 Long term (current) use of insulin: Secondary | ICD-10-CM | POA: Diagnosis not present

## 2017-09-27 DIAGNOSIS — E099 Drug or chemical induced diabetes mellitus without complications: Secondary | ICD-10-CM | POA: Diagnosis not present

## 2017-09-27 DIAGNOSIS — E041 Nontoxic single thyroid nodule: Secondary | ICD-10-CM

## 2017-09-27 DIAGNOSIS — E236 Other disorders of pituitary gland: Secondary | ICD-10-CM | POA: Diagnosis not present

## 2017-10-24 DIAGNOSIS — M170 Bilateral primary osteoarthritis of knee: Secondary | ICD-10-CM | POA: Diagnosis not present

## 2017-10-24 DIAGNOSIS — M25561 Pain in right knee: Secondary | ICD-10-CM | POA: Diagnosis not present

## 2017-10-24 DIAGNOSIS — M25562 Pain in left knee: Secondary | ICD-10-CM | POA: Diagnosis not present

## 2017-11-06 ENCOUNTER — Other Ambulatory Visit: Payer: Medicare HMO

## 2017-11-29 ENCOUNTER — Ambulatory Visit
Admission: RE | Admit: 2017-11-29 | Discharge: 2017-11-29 | Disposition: A | Discharge status: Home / Self Care | Payer: Medicare HMO | Source: Ambulatory Visit | Attending: Internal Medicine | Admitting: Internal Medicine

## 2017-11-29 DIAGNOSIS — E041 Nontoxic single thyroid nodule: Secondary | ICD-10-CM

## 2017-11-29 DIAGNOSIS — E042 Nontoxic multinodular goiter: Secondary | ICD-10-CM | POA: Diagnosis not present

## 2018-01-18 DIAGNOSIS — E041 Nontoxic single thyroid nodule: Secondary | ICD-10-CM | POA: Diagnosis not present

## 2018-01-18 DIAGNOSIS — E236 Other disorders of pituitary gland: Secondary | ICD-10-CM | POA: Diagnosis not present

## 2018-01-18 DIAGNOSIS — E099 Drug or chemical induced diabetes mellitus without complications: Secondary | ICD-10-CM | POA: Diagnosis not present

## 2018-01-18 DIAGNOSIS — Z5181 Encounter for therapeutic drug level monitoring: Secondary | ICD-10-CM | POA: Diagnosis not present

## 2018-01-18 DIAGNOSIS — Z794 Long term (current) use of insulin: Secondary | ICD-10-CM | POA: Diagnosis not present

## 2018-03-21 DIAGNOSIS — E0965 Drug or chemical induced diabetes mellitus with hyperglycemia: Secondary | ICD-10-CM | POA: Diagnosis not present

## 2018-03-21 DIAGNOSIS — Z79899 Other long term (current) drug therapy: Secondary | ICD-10-CM | POA: Diagnosis not present

## 2018-04-18 DIAGNOSIS — E236 Other disorders of pituitary gland: Secondary | ICD-10-CM | POA: Diagnosis not present

## 2018-04-18 DIAGNOSIS — Z5181 Encounter for therapeutic drug level monitoring: Secondary | ICD-10-CM | POA: Diagnosis not present

## 2018-04-18 DIAGNOSIS — E041 Nontoxic single thyroid nodule: Secondary | ICD-10-CM | POA: Diagnosis not present

## 2018-04-18 DIAGNOSIS — E099 Drug or chemical induced diabetes mellitus without complications: Secondary | ICD-10-CM | POA: Diagnosis not present

## 2018-04-18 DIAGNOSIS — Z794 Long term (current) use of insulin: Secondary | ICD-10-CM | POA: Diagnosis not present

## 2018-05-20 DIAGNOSIS — M170 Bilateral primary osteoarthritis of knee: Secondary | ICD-10-CM | POA: Diagnosis not present

## 2018-06-03 DIAGNOSIS — H2513 Age-related nuclear cataract, bilateral: Secondary | ICD-10-CM | POA: Diagnosis not present

## 2018-06-03 DIAGNOSIS — H43812 Vitreous degeneration, left eye: Secondary | ICD-10-CM | POA: Diagnosis not present

## 2018-06-03 DIAGNOSIS — D352 Benign neoplasm of pituitary gland: Secondary | ICD-10-CM | POA: Diagnosis not present

## 2018-06-03 DIAGNOSIS — E119 Type 2 diabetes mellitus without complications: Secondary | ICD-10-CM | POA: Diagnosis not present

## 2018-07-23 DIAGNOSIS — M5136 Other intervertebral disc degeneration, lumbar region: Secondary | ICD-10-CM | POA: Diagnosis not present

## 2018-07-23 DIAGNOSIS — E1140 Type 2 diabetes mellitus with diabetic neuropathy, unspecified: Secondary | ICD-10-CM | POA: Diagnosis not present

## 2018-07-23 DIAGNOSIS — E039 Hypothyroidism, unspecified: Secondary | ICD-10-CM | POA: Diagnosis not present

## 2018-07-23 DIAGNOSIS — E1165 Type 2 diabetes mellitus with hyperglycemia: Secondary | ICD-10-CM | POA: Diagnosis not present

## 2018-07-23 DIAGNOSIS — F324 Major depressive disorder, single episode, in partial remission: Secondary | ICD-10-CM | POA: Diagnosis not present

## 2018-07-23 DIAGNOSIS — I10 Essential (primary) hypertension: Secondary | ICD-10-CM | POA: Diagnosis not present

## 2018-07-23 DIAGNOSIS — G2581 Restless legs syndrome: Secondary | ICD-10-CM | POA: Diagnosis not present

## 2018-07-23 DIAGNOSIS — E782 Mixed hyperlipidemia: Secondary | ICD-10-CM | POA: Diagnosis not present

## 2018-07-23 DIAGNOSIS — G4733 Obstructive sleep apnea (adult) (pediatric): Secondary | ICD-10-CM | POA: Diagnosis not present

## 2018-07-25 ENCOUNTER — Other Ambulatory Visit: Payer: Self-pay | Admitting: Internal Medicine

## 2018-07-25 DIAGNOSIS — Z1231 Encounter for screening mammogram for malignant neoplasm of breast: Secondary | ICD-10-CM

## 2018-07-26 DIAGNOSIS — Z794 Long term (current) use of insulin: Secondary | ICD-10-CM | POA: Diagnosis not present

## 2018-07-26 DIAGNOSIS — E236 Other disorders of pituitary gland: Secondary | ICD-10-CM | POA: Diagnosis not present

## 2018-07-26 DIAGNOSIS — Z5181 Encounter for therapeutic drug level monitoring: Secondary | ICD-10-CM | POA: Diagnosis not present

## 2018-07-26 DIAGNOSIS — E041 Nontoxic single thyroid nodule: Secondary | ICD-10-CM | POA: Diagnosis not present

## 2018-07-26 DIAGNOSIS — E099 Drug or chemical induced diabetes mellitus without complications: Secondary | ICD-10-CM | POA: Diagnosis not present

## 2018-08-20 ENCOUNTER — Ambulatory Visit
Admission: RE | Admit: 2018-08-20 | Discharge: 2018-08-20 | Disposition: A | Discharge status: Home / Self Care | Payer: Medicare HMO | Source: Ambulatory Visit | Attending: Internal Medicine | Admitting: Internal Medicine

## 2018-08-20 DIAGNOSIS — Z1231 Encounter for screening mammogram for malignant neoplasm of breast: Secondary | ICD-10-CM

## 2018-09-06 DIAGNOSIS — E099 Drug or chemical induced diabetes mellitus without complications: Secondary | ICD-10-CM | POA: Diagnosis not present

## 2018-09-06 DIAGNOSIS — E236 Other disorders of pituitary gland: Secondary | ICD-10-CM | POA: Diagnosis not present

## 2018-09-06 DIAGNOSIS — Z794 Long term (current) use of insulin: Secondary | ICD-10-CM | POA: Diagnosis not present

## 2018-09-06 DIAGNOSIS — E041 Nontoxic single thyroid nodule: Secondary | ICD-10-CM | POA: Diagnosis not present

## 2018-09-06 DIAGNOSIS — Z5181 Encounter for therapeutic drug level monitoring: Secondary | ICD-10-CM | POA: Diagnosis not present

## 2018-09-10 DIAGNOSIS — Z6841 Body Mass Index (BMI) 40.0 and over, adult: Secondary | ICD-10-CM | POA: Diagnosis not present

## 2018-09-10 DIAGNOSIS — Z124 Encounter for screening for malignant neoplasm of cervix: Secondary | ICD-10-CM | POA: Diagnosis not present

## 2018-11-25 DIAGNOSIS — Z5181 Encounter for therapeutic drug level monitoring: Secondary | ICD-10-CM | POA: Diagnosis not present

## 2018-11-25 DIAGNOSIS — E236 Other disorders of pituitary gland: Secondary | ICD-10-CM | POA: Diagnosis not present

## 2018-11-25 DIAGNOSIS — Z794 Long term (current) use of insulin: Secondary | ICD-10-CM | POA: Diagnosis not present

## 2018-11-25 DIAGNOSIS — E099 Drug or chemical induced diabetes mellitus without complications: Secondary | ICD-10-CM | POA: Diagnosis not present

## 2018-11-25 DIAGNOSIS — E1165 Type 2 diabetes mellitus with hyperglycemia: Secondary | ICD-10-CM | POA: Diagnosis not present

## 2018-11-25 DIAGNOSIS — E041 Nontoxic single thyroid nodule: Secondary | ICD-10-CM | POA: Diagnosis not present

## 2019-02-05 DIAGNOSIS — M170 Bilateral primary osteoarthritis of knee: Secondary | ICD-10-CM | POA: Diagnosis not present

## 2019-07-29 DIAGNOSIS — Z0000 Encounter for general adult medical examination without abnormal findings: Secondary | ICD-10-CM | POA: Diagnosis not present

## 2019-07-29 DIAGNOSIS — Z1389 Encounter for screening for other disorder: Secondary | ICD-10-CM | POA: Diagnosis not present

## 2019-08-27 DIAGNOSIS — E1140 Type 2 diabetes mellitus with diabetic neuropathy, unspecified: Secondary | ICD-10-CM | POA: Diagnosis not present

## 2019-08-27 DIAGNOSIS — E039 Hypothyroidism, unspecified: Secondary | ICD-10-CM | POA: Diagnosis not present

## 2019-08-27 DIAGNOSIS — M8580 Other specified disorders of bone density and structure, unspecified site: Secondary | ICD-10-CM | POA: Diagnosis not present

## 2019-08-27 DIAGNOSIS — E0965 Drug or chemical induced diabetes mellitus with hyperglycemia: Secondary | ICD-10-CM | POA: Diagnosis not present

## 2019-08-27 DIAGNOSIS — E099 Drug or chemical induced diabetes mellitus without complications: Secondary | ICD-10-CM | POA: Diagnosis not present

## 2019-08-27 DIAGNOSIS — F324 Major depressive disorder, single episode, in partial remission: Secondary | ICD-10-CM | POA: Diagnosis not present

## 2019-08-27 DIAGNOSIS — E782 Mixed hyperlipidemia: Secondary | ICD-10-CM | POA: Diagnosis not present

## 2019-08-27 DIAGNOSIS — I5020 Unspecified systolic (congestive) heart failure: Secondary | ICD-10-CM | POA: Diagnosis not present

## 2019-08-27 DIAGNOSIS — I10 Essential (primary) hypertension: Secondary | ICD-10-CM | POA: Diagnosis not present

## 2019-08-28 DIAGNOSIS — E1140 Type 2 diabetes mellitus with diabetic neuropathy, unspecified: Secondary | ICD-10-CM | POA: Diagnosis not present

## 2019-08-28 DIAGNOSIS — Z23 Encounter for immunization: Secondary | ICD-10-CM | POA: Diagnosis not present

## 2019-08-28 DIAGNOSIS — Z794 Long term (current) use of insulin: Secondary | ICD-10-CM | POA: Diagnosis not present

## 2019-09-26 DIAGNOSIS — I5020 Unspecified systolic (congestive) heart failure: Secondary | ICD-10-CM | POA: Diagnosis not present

## 2019-09-26 DIAGNOSIS — F324 Major depressive disorder, single episode, in partial remission: Secondary | ICD-10-CM | POA: Diagnosis not present

## 2019-09-26 DIAGNOSIS — F325 Major depressive disorder, single episode, in full remission: Secondary | ICD-10-CM | POA: Diagnosis not present

## 2019-09-26 DIAGNOSIS — E099 Drug or chemical induced diabetes mellitus without complications: Secondary | ICD-10-CM | POA: Diagnosis not present

## 2019-09-26 DIAGNOSIS — I10 Essential (primary) hypertension: Secondary | ICD-10-CM | POA: Diagnosis not present

## 2019-09-26 DIAGNOSIS — E0965 Drug or chemical induced diabetes mellitus with hyperglycemia: Secondary | ICD-10-CM | POA: Diagnosis not present

## 2019-09-26 DIAGNOSIS — E039 Hypothyroidism, unspecified: Secondary | ICD-10-CM | POA: Diagnosis not present

## 2019-09-26 DIAGNOSIS — E782 Mixed hyperlipidemia: Secondary | ICD-10-CM | POA: Diagnosis not present

## 2019-09-26 DIAGNOSIS — M8580 Other specified disorders of bone density and structure, unspecified site: Secondary | ICD-10-CM | POA: Diagnosis not present

## 2019-10-03 DIAGNOSIS — M170 Bilateral primary osteoarthritis of knee: Secondary | ICD-10-CM | POA: Diagnosis not present

## 2019-10-03 DIAGNOSIS — Z6841 Body Mass Index (BMI) 40.0 and over, adult: Secondary | ICD-10-CM | POA: Diagnosis not present

## 2019-10-14 DIAGNOSIS — E039 Hypothyroidism, unspecified: Secondary | ICD-10-CM | POA: Diagnosis not present

## 2019-10-14 DIAGNOSIS — E0965 Drug or chemical induced diabetes mellitus with hyperglycemia: Secondary | ICD-10-CM | POA: Diagnosis not present

## 2019-10-14 DIAGNOSIS — E1140 Type 2 diabetes mellitus with diabetic neuropathy, unspecified: Secondary | ICD-10-CM | POA: Diagnosis not present

## 2019-10-14 DIAGNOSIS — M8580 Other specified disorders of bone density and structure, unspecified site: Secondary | ICD-10-CM | POA: Diagnosis not present

## 2019-10-14 DIAGNOSIS — F324 Major depressive disorder, single episode, in partial remission: Secondary | ICD-10-CM | POA: Diagnosis not present

## 2019-10-14 DIAGNOSIS — I10 Essential (primary) hypertension: Secondary | ICD-10-CM | POA: Diagnosis not present

## 2019-10-14 DIAGNOSIS — M179 Osteoarthritis of knee, unspecified: Secondary | ICD-10-CM | POA: Diagnosis not present

## 2019-10-14 DIAGNOSIS — I5020 Unspecified systolic (congestive) heart failure: Secondary | ICD-10-CM | POA: Diagnosis not present

## 2019-10-14 DIAGNOSIS — E782 Mixed hyperlipidemia: Secondary | ICD-10-CM | POA: Diagnosis not present

## 2019-11-07 DIAGNOSIS — M8580 Other specified disorders of bone density and structure, unspecified site: Secondary | ICD-10-CM | POA: Diagnosis not present

## 2019-11-07 DIAGNOSIS — E1140 Type 2 diabetes mellitus with diabetic neuropathy, unspecified: Secondary | ICD-10-CM | POA: Diagnosis not present

## 2019-11-07 DIAGNOSIS — E039 Hypothyroidism, unspecified: Secondary | ICD-10-CM | POA: Diagnosis not present

## 2019-11-07 DIAGNOSIS — E0965 Drug or chemical induced diabetes mellitus with hyperglycemia: Secondary | ICD-10-CM | POA: Diagnosis not present

## 2019-11-07 DIAGNOSIS — E099 Drug or chemical induced diabetes mellitus without complications: Secondary | ICD-10-CM | POA: Diagnosis not present

## 2019-11-07 DIAGNOSIS — I10 Essential (primary) hypertension: Secondary | ICD-10-CM | POA: Diagnosis not present

## 2019-11-07 DIAGNOSIS — E782 Mixed hyperlipidemia: Secondary | ICD-10-CM | POA: Diagnosis not present

## 2019-11-07 DIAGNOSIS — I5020 Unspecified systolic (congestive) heart failure: Secondary | ICD-10-CM | POA: Diagnosis not present

## 2019-11-07 DIAGNOSIS — F324 Major depressive disorder, single episode, in partial remission: Secondary | ICD-10-CM | POA: Diagnosis not present

## 2019-11-17 DIAGNOSIS — H2513 Age-related nuclear cataract, bilateral: Secondary | ICD-10-CM | POA: Diagnosis not present

## 2019-11-17 DIAGNOSIS — H43812 Vitreous degeneration, left eye: Secondary | ICD-10-CM | POA: Diagnosis not present

## 2019-11-17 DIAGNOSIS — D443 Neoplasm of uncertain behavior of pituitary gland: Secondary | ICD-10-CM | POA: Diagnosis not present

## 2019-11-17 DIAGNOSIS — E119 Type 2 diabetes mellitus without complications: Secondary | ICD-10-CM | POA: Diagnosis not present

## 2019-12-09 DIAGNOSIS — M8580 Other specified disorders of bone density and structure, unspecified site: Secondary | ICD-10-CM | POA: Diagnosis not present

## 2019-12-09 DIAGNOSIS — F324 Major depressive disorder, single episode, in partial remission: Secondary | ICD-10-CM | POA: Diagnosis not present

## 2019-12-09 DIAGNOSIS — F325 Major depressive disorder, single episode, in full remission: Secondary | ICD-10-CM | POA: Diagnosis not present

## 2019-12-09 DIAGNOSIS — I10 Essential (primary) hypertension: Secondary | ICD-10-CM | POA: Diagnosis not present

## 2019-12-09 DIAGNOSIS — I5020 Unspecified systolic (congestive) heart failure: Secondary | ICD-10-CM | POA: Diagnosis not present

## 2019-12-09 DIAGNOSIS — E099 Drug or chemical induced diabetes mellitus without complications: Secondary | ICD-10-CM | POA: Diagnosis not present

## 2019-12-09 DIAGNOSIS — E039 Hypothyroidism, unspecified: Secondary | ICD-10-CM | POA: Diagnosis not present

## 2019-12-09 DIAGNOSIS — E0965 Drug or chemical induced diabetes mellitus with hyperglycemia: Secondary | ICD-10-CM | POA: Diagnosis not present

## 2019-12-09 DIAGNOSIS — E782 Mixed hyperlipidemia: Secondary | ICD-10-CM | POA: Diagnosis not present

## 2019-12-15 DIAGNOSIS — Z5181 Encounter for therapeutic drug level monitoring: Secondary | ICD-10-CM | POA: Diagnosis not present

## 2019-12-15 DIAGNOSIS — M5136 Other intervertebral disc degeneration, lumbar region: Secondary | ICD-10-CM | POA: Diagnosis not present

## 2019-12-15 DIAGNOSIS — E041 Nontoxic single thyroid nodule: Secondary | ICD-10-CM | POA: Diagnosis not present

## 2019-12-15 DIAGNOSIS — E236 Other disorders of pituitary gland: Secondary | ICD-10-CM | POA: Diagnosis not present

## 2019-12-15 DIAGNOSIS — Z794 Long term (current) use of insulin: Secondary | ICD-10-CM | POA: Diagnosis not present

## 2019-12-15 DIAGNOSIS — Z6841 Body Mass Index (BMI) 40.0 and over, adult: Secondary | ICD-10-CM | POA: Diagnosis not present

## 2019-12-15 DIAGNOSIS — I10 Essential (primary) hypertension: Secondary | ICD-10-CM | POA: Diagnosis not present

## 2019-12-15 DIAGNOSIS — G629 Polyneuropathy, unspecified: Secondary | ICD-10-CM | POA: Diagnosis not present

## 2019-12-15 DIAGNOSIS — G2581 Restless legs syndrome: Secondary | ICD-10-CM | POA: Diagnosis not present

## 2019-12-15 DIAGNOSIS — F325 Major depressive disorder, single episode, in full remission: Secondary | ICD-10-CM | POA: Diagnosis not present

## 2019-12-15 DIAGNOSIS — E0936 Drug or chemical induced diabetes mellitus with diabetic cataract: Secondary | ICD-10-CM | POA: Diagnosis not present

## 2019-12-15 DIAGNOSIS — E099 Drug or chemical induced diabetes mellitus without complications: Secondary | ICD-10-CM | POA: Diagnosis not present

## 2019-12-30 DIAGNOSIS — Z794 Long term (current) use of insulin: Secondary | ICD-10-CM | POA: Diagnosis not present

## 2019-12-30 DIAGNOSIS — M179 Osteoarthritis of knee, unspecified: Secondary | ICD-10-CM | POA: Diagnosis not present

## 2019-12-30 DIAGNOSIS — E0965 Drug or chemical induced diabetes mellitus with hyperglycemia: Secondary | ICD-10-CM | POA: Diagnosis not present

## 2019-12-30 DIAGNOSIS — E1140 Type 2 diabetes mellitus with diabetic neuropathy, unspecified: Secondary | ICD-10-CM | POA: Diagnosis not present

## 2019-12-30 DIAGNOSIS — I10 Essential (primary) hypertension: Secondary | ICD-10-CM | POA: Diagnosis not present

## 2019-12-30 DIAGNOSIS — E039 Hypothyroidism, unspecified: Secondary | ICD-10-CM | POA: Diagnosis not present

## 2019-12-30 DIAGNOSIS — M8580 Other specified disorders of bone density and structure, unspecified site: Secondary | ICD-10-CM | POA: Diagnosis not present

## 2019-12-30 DIAGNOSIS — E782 Mixed hyperlipidemia: Secondary | ICD-10-CM | POA: Diagnosis not present

## 2019-12-30 DIAGNOSIS — F324 Major depressive disorder, single episode, in partial remission: Secondary | ICD-10-CM | POA: Diagnosis not present

## 2019-12-30 DIAGNOSIS — I5020 Unspecified systolic (congestive) heart failure: Secondary | ICD-10-CM | POA: Diagnosis not present

## 2020-02-02 DIAGNOSIS — H04123 Dry eye syndrome of bilateral lacrimal glands: Secondary | ICD-10-CM | POA: Diagnosis not present

## 2020-02-02 DIAGNOSIS — H52223 Regular astigmatism, bilateral: Secondary | ICD-10-CM | POA: Diagnosis not present

## 2020-02-02 DIAGNOSIS — E119 Type 2 diabetes mellitus without complications: Secondary | ICD-10-CM | POA: Diagnosis not present

## 2020-02-02 DIAGNOSIS — H25813 Combined forms of age-related cataract, bilateral: Secondary | ICD-10-CM | POA: Diagnosis not present

## 2020-02-12 DIAGNOSIS — E236 Other disorders of pituitary gland: Secondary | ICD-10-CM | POA: Diagnosis not present

## 2020-02-22 DIAGNOSIS — Z20822 Contact with and (suspected) exposure to covid-19: Secondary | ICD-10-CM | POA: Diagnosis not present

## 2020-02-22 DIAGNOSIS — Z01812 Encounter for preprocedural laboratory examination: Secondary | ICD-10-CM | POA: Diagnosis not present

## 2020-02-22 DIAGNOSIS — H268 Other specified cataract: Secondary | ICD-10-CM | POA: Diagnosis not present

## 2020-02-25 DIAGNOSIS — E11319 Type 2 diabetes mellitus with unspecified diabetic retinopathy without macular edema: Secondary | ICD-10-CM | POA: Diagnosis not present

## 2020-02-25 DIAGNOSIS — Z6841 Body Mass Index (BMI) 40.0 and over, adult: Secondary | ICD-10-CM | POA: Diagnosis not present

## 2020-02-25 DIAGNOSIS — H52223 Regular astigmatism, bilateral: Secondary | ICD-10-CM | POA: Diagnosis not present

## 2020-02-25 DIAGNOSIS — E039 Hypothyroidism, unspecified: Secondary | ICD-10-CM | POA: Diagnosis not present

## 2020-02-25 DIAGNOSIS — Z794 Long term (current) use of insulin: Secondary | ICD-10-CM | POA: Diagnosis not present

## 2020-02-25 DIAGNOSIS — H25813 Combined forms of age-related cataract, bilateral: Secondary | ICD-10-CM | POA: Diagnosis not present

## 2020-02-25 DIAGNOSIS — I10 Essential (primary) hypertension: Secondary | ICD-10-CM | POA: Diagnosis not present

## 2020-02-25 DIAGNOSIS — H04123 Dry eye syndrome of bilateral lacrimal glands: Secondary | ICD-10-CM | POA: Diagnosis not present

## 2020-02-25 DIAGNOSIS — E1136 Type 2 diabetes mellitus with diabetic cataract: Secondary | ICD-10-CM | POA: Diagnosis not present

## 2020-02-25 DIAGNOSIS — H25812 Combined forms of age-related cataract, left eye: Secondary | ICD-10-CM | POA: Diagnosis not present

## 2020-03-08 ENCOUNTER — Other Ambulatory Visit: Payer: Self-pay | Admitting: Internal Medicine

## 2020-03-14 DIAGNOSIS — Z20822 Contact with and (suspected) exposure to covid-19: Secondary | ICD-10-CM | POA: Diagnosis not present

## 2020-03-14 DIAGNOSIS — H25811 Combined forms of age-related cataract, right eye: Secondary | ICD-10-CM | POA: Diagnosis not present

## 2020-03-14 DIAGNOSIS — Z01812 Encounter for preprocedural laboratory examination: Secondary | ICD-10-CM | POA: Diagnosis not present

## 2020-03-16 DIAGNOSIS — Z794 Long term (current) use of insulin: Secondary | ICD-10-CM | POA: Diagnosis not present

## 2020-03-16 DIAGNOSIS — E041 Nontoxic single thyroid nodule: Secondary | ICD-10-CM | POA: Diagnosis not present

## 2020-03-16 DIAGNOSIS — E236 Other disorders of pituitary gland: Secondary | ICD-10-CM | POA: Diagnosis not present

## 2020-03-16 DIAGNOSIS — Z5181 Encounter for therapeutic drug level monitoring: Secondary | ICD-10-CM | POA: Diagnosis not present

## 2020-03-16 DIAGNOSIS — E099 Drug or chemical induced diabetes mellitus without complications: Secondary | ICD-10-CM | POA: Diagnosis not present

## 2020-03-17 DIAGNOSIS — Z79899 Other long term (current) drug therapy: Secondary | ICD-10-CM | POA: Diagnosis not present

## 2020-03-17 DIAGNOSIS — E1136 Type 2 diabetes mellitus with diabetic cataract: Secondary | ICD-10-CM | POA: Diagnosis not present

## 2020-03-17 DIAGNOSIS — Z794 Long term (current) use of insulin: Secondary | ICD-10-CM | POA: Diagnosis not present

## 2020-03-17 DIAGNOSIS — Z6841 Body Mass Index (BMI) 40.0 and over, adult: Secondary | ICD-10-CM | POA: Diagnosis not present

## 2020-03-17 DIAGNOSIS — I10 Essential (primary) hypertension: Secondary | ICD-10-CM | POA: Diagnosis not present

## 2020-03-17 DIAGNOSIS — Z79890 Hormone replacement therapy (postmenopausal): Secondary | ICD-10-CM | POA: Diagnosis not present

## 2020-03-17 DIAGNOSIS — Z888 Allergy status to other drugs, medicaments and biological substances status: Secondary | ICD-10-CM | POA: Diagnosis not present

## 2020-03-17 DIAGNOSIS — H25811 Combined forms of age-related cataract, right eye: Secondary | ICD-10-CM | POA: Diagnosis not present

## 2020-03-17 DIAGNOSIS — E039 Hypothyroidism, unspecified: Secondary | ICD-10-CM | POA: Diagnosis not present

## 2020-03-17 DIAGNOSIS — L231 Allergic contact dermatitis due to adhesives: Secondary | ICD-10-CM | POA: Diagnosis not present

## 2020-03-17 DIAGNOSIS — Z885 Allergy status to narcotic agent status: Secondary | ICD-10-CM | POA: Diagnosis not present

## 2020-03-25 DIAGNOSIS — F325 Major depressive disorder, single episode, in full remission: Secondary | ICD-10-CM | POA: Diagnosis not present

## 2020-03-25 DIAGNOSIS — E099 Drug or chemical induced diabetes mellitus without complications: Secondary | ICD-10-CM | POA: Diagnosis not present

## 2020-03-25 DIAGNOSIS — I5020 Unspecified systolic (congestive) heart failure: Secondary | ICD-10-CM | POA: Diagnosis not present

## 2020-03-25 DIAGNOSIS — E0965 Drug or chemical induced diabetes mellitus with hyperglycemia: Secondary | ICD-10-CM | POA: Diagnosis not present

## 2020-03-25 DIAGNOSIS — I10 Essential (primary) hypertension: Secondary | ICD-10-CM | POA: Diagnosis not present

## 2020-03-25 DIAGNOSIS — F324 Major depressive disorder, single episode, in partial remission: Secondary | ICD-10-CM | POA: Diagnosis not present

## 2020-03-25 DIAGNOSIS — M8580 Other specified disorders of bone density and structure, unspecified site: Secondary | ICD-10-CM | POA: Diagnosis not present

## 2020-03-25 DIAGNOSIS — E782 Mixed hyperlipidemia: Secondary | ICD-10-CM | POA: Diagnosis not present

## 2020-03-25 DIAGNOSIS — E039 Hypothyroidism, unspecified: Secondary | ICD-10-CM | POA: Diagnosis not present

## 2020-04-09 DIAGNOSIS — I5020 Unspecified systolic (congestive) heart failure: Secondary | ICD-10-CM | POA: Diagnosis not present

## 2020-04-09 DIAGNOSIS — I10 Essential (primary) hypertension: Secondary | ICD-10-CM | POA: Diagnosis not present

## 2020-04-09 DIAGNOSIS — M8580 Other specified disorders of bone density and structure, unspecified site: Secondary | ICD-10-CM | POA: Diagnosis not present

## 2020-04-09 DIAGNOSIS — E782 Mixed hyperlipidemia: Secondary | ICD-10-CM | POA: Diagnosis not present

## 2020-04-09 DIAGNOSIS — E039 Hypothyroidism, unspecified: Secondary | ICD-10-CM | POA: Diagnosis not present

## 2020-04-09 DIAGNOSIS — F324 Major depressive disorder, single episode, in partial remission: Secondary | ICD-10-CM | POA: Diagnosis not present

## 2020-04-09 DIAGNOSIS — E099 Drug or chemical induced diabetes mellitus without complications: Secondary | ICD-10-CM | POA: Diagnosis not present

## 2020-04-09 DIAGNOSIS — F325 Major depressive disorder, single episode, in full remission: Secondary | ICD-10-CM | POA: Diagnosis not present

## 2020-04-09 DIAGNOSIS — E0965 Drug or chemical induced diabetes mellitus with hyperglycemia: Secondary | ICD-10-CM | POA: Diagnosis not present

## 2020-06-09 ENCOUNTER — Inpatient Hospital Stay (HOSPITAL_COMMUNITY): Payer: Medicare HMO

## 2020-06-09 ENCOUNTER — Other Ambulatory Visit: Payer: Self-pay

## 2020-06-09 ENCOUNTER — Emergency Department (HOSPITAL_COMMUNITY): Payer: Medicare HMO

## 2020-06-09 ENCOUNTER — Encounter (HOSPITAL_COMMUNITY): Payer: Self-pay | Admitting: Emergency Medicine

## 2020-06-09 ENCOUNTER — Inpatient Hospital Stay (HOSPITAL_COMMUNITY)
Admission: EM | Admit: 2020-06-09 | Discharge: 2020-06-13 | DRG: 871 | Disposition: A | Discharge status: Home / Self Care | Payer: Medicare HMO | Attending: Internal Medicine | Admitting: Internal Medicine

## 2020-06-09 DIAGNOSIS — E1169 Type 2 diabetes mellitus with other specified complication: Secondary | ICD-10-CM | POA: Diagnosis not present

## 2020-06-09 DIAGNOSIS — Z888 Allergy status to other drugs, medicaments and biological substances status: Secondary | ICD-10-CM

## 2020-06-09 DIAGNOSIS — R0902 Hypoxemia: Secondary | ICD-10-CM | POA: Diagnosis not present

## 2020-06-09 DIAGNOSIS — Z7982 Long term (current) use of aspirin: Secondary | ICD-10-CM

## 2020-06-09 DIAGNOSIS — I110 Hypertensive heart disease with heart failure: Secondary | ICD-10-CM | POA: Diagnosis not present

## 2020-06-09 DIAGNOSIS — Z794 Long term (current) use of insulin: Secondary | ICD-10-CM

## 2020-06-09 DIAGNOSIS — A419 Sepsis, unspecified organism: Secondary | ICD-10-CM | POA: Diagnosis not present

## 2020-06-09 DIAGNOSIS — Z91048 Other nonmedicinal substance allergy status: Secondary | ICD-10-CM

## 2020-06-09 DIAGNOSIS — G2581 Restless legs syndrome: Secondary | ICD-10-CM | POA: Diagnosis present

## 2020-06-09 DIAGNOSIS — I5033 Acute on chronic diastolic (congestive) heart failure: Secondary | ICD-10-CM | POA: Diagnosis not present

## 2020-06-09 DIAGNOSIS — Z885 Allergy status to narcotic agent status: Secondary | ICD-10-CM

## 2020-06-09 DIAGNOSIS — E876 Hypokalemia: Secondary | ICD-10-CM | POA: Diagnosis not present

## 2020-06-09 DIAGNOSIS — B372 Candidiasis of skin and nail: Secondary | ICD-10-CM | POA: Diagnosis not present

## 2020-06-09 DIAGNOSIS — A4189 Other specified sepsis: Secondary | ICD-10-CM | POA: Diagnosis not present

## 2020-06-09 DIAGNOSIS — I480 Paroxysmal atrial fibrillation: Secondary | ICD-10-CM | POA: Diagnosis present

## 2020-06-09 DIAGNOSIS — R000 Tachycardia, unspecified: Secondary | ICD-10-CM | POA: Diagnosis not present

## 2020-06-09 DIAGNOSIS — Z713 Dietary counseling and surveillance: Secondary | ICD-10-CM

## 2020-06-09 DIAGNOSIS — J9601 Acute respiratory failure with hypoxia: Secondary | ICD-10-CM | POA: Diagnosis present

## 2020-06-09 DIAGNOSIS — R7989 Other specified abnormal findings of blood chemistry: Secondary | ICD-10-CM

## 2020-06-09 DIAGNOSIS — Z981 Arthrodesis status: Secondary | ICD-10-CM | POA: Diagnosis not present

## 2020-06-09 DIAGNOSIS — J810 Acute pulmonary edema: Secondary | ICD-10-CM | POA: Diagnosis present

## 2020-06-09 DIAGNOSIS — Z8249 Family history of ischemic heart disease and other diseases of the circulatory system: Secondary | ICD-10-CM

## 2020-06-09 DIAGNOSIS — R42 Dizziness and giddiness: Secondary | ICD-10-CM | POA: Diagnosis not present

## 2020-06-09 DIAGNOSIS — E1165 Type 2 diabetes mellitus with hyperglycemia: Secondary | ICD-10-CM | POA: Diagnosis present

## 2020-06-09 DIAGNOSIS — E669 Obesity, unspecified: Secondary | ICD-10-CM | POA: Diagnosis not present

## 2020-06-09 DIAGNOSIS — Z6841 Body Mass Index (BMI) 40.0 and over, adult: Secondary | ICD-10-CM

## 2020-06-09 DIAGNOSIS — I4891 Unspecified atrial fibrillation: Secondary | ICD-10-CM

## 2020-06-09 DIAGNOSIS — G4733 Obstructive sleep apnea (adult) (pediatric): Secondary | ICD-10-CM | POA: Diagnosis present

## 2020-06-09 DIAGNOSIS — U071 COVID-19: Secondary | ICD-10-CM | POA: Diagnosis not present

## 2020-06-09 DIAGNOSIS — D6869 Other thrombophilia: Secondary | ICD-10-CM | POA: Diagnosis not present

## 2020-06-09 DIAGNOSIS — M25551 Pain in right hip: Secondary | ICD-10-CM | POA: Diagnosis not present

## 2020-06-09 DIAGNOSIS — I10 Essential (primary) hypertension: Secondary | ICD-10-CM | POA: Diagnosis not present

## 2020-06-09 DIAGNOSIS — I517 Cardiomegaly: Secondary | ICD-10-CM | POA: Diagnosis not present

## 2020-06-09 DIAGNOSIS — E039 Hypothyroidism, unspecified: Secondary | ICD-10-CM | POA: Diagnosis present

## 2020-06-09 DIAGNOSIS — J1282 Pneumonia due to coronavirus disease 2019: Secondary | ICD-10-CM | POA: Diagnosis present

## 2020-06-09 DIAGNOSIS — R0602 Shortness of breath: Secondary | ICD-10-CM | POA: Diagnosis not present

## 2020-06-09 DIAGNOSIS — E2740 Unspecified adrenocortical insufficiency: Secondary | ICD-10-CM | POA: Diagnosis present

## 2020-06-09 DIAGNOSIS — Z79899 Other long term (current) drug therapy: Secondary | ICD-10-CM | POA: Diagnosis not present

## 2020-06-09 DIAGNOSIS — R6520 Severe sepsis without septic shock: Secondary | ICD-10-CM | POA: Diagnosis not present

## 2020-06-09 DIAGNOSIS — Z79890 Hormone replacement therapy (postmenopausal): Secondary | ICD-10-CM

## 2020-06-09 DIAGNOSIS — R778 Other specified abnormalities of plasma proteins: Secondary | ICD-10-CM

## 2020-06-09 LAB — URINALYSIS, ROUTINE W REFLEX MICROSCOPIC
Bilirubin Urine: NEGATIVE
Glucose, UA: NEGATIVE mg/dL
Hgb urine dipstick: NEGATIVE
Ketones, ur: NEGATIVE mg/dL
Leukocytes,Ua: NEGATIVE
Nitrite: NEGATIVE
Protein, ur: NEGATIVE mg/dL
Specific Gravity, Urine: <1.005 — ABNORMAL LOW (ref 1.005–1.030)
pH: 6 (ref 5.0–8.0)

## 2020-06-09 LAB — LACTIC ACID, PLASMA
Lactic Acid, Venous: 1.7 mmol/L (ref 0.5–1.9)
Lactic Acid, Venous: 3.1 mmol/L (ref 0.5–1.9)
Lactic Acid, Venous: 1.6 mmol/L (ref 0.5–1.9)

## 2020-06-09 LAB — HEPATIC FUNCTION PANEL
ALT: 29 U/L (ref 0–44)
AST: 37 U/L (ref 15–41)
Albumin: 2.9 g/dL — ABNORMAL LOW (ref 3.5–5.0)
Alkaline Phosphatase: 44 U/L (ref 38–126)
Bilirubin, Direct: 0.2 mg/dL (ref 0.0–0.2)
Indirect Bilirubin: 0.4 mg/dL (ref 0.3–0.9)
Total Bilirubin: 0.6 mg/dL (ref 0.3–1.2)
Total Protein: 6.3 g/dL — ABNORMAL LOW (ref 6.5–8.1)

## 2020-06-09 LAB — C-REACTIVE PROTEIN: CRP: 5.5 mg/dL — ABNORMAL HIGH (ref <1.0)

## 2020-06-09 LAB — BASIC METABOLIC PANEL
Anion gap: 12 (ref 5–15)
BUN: 9 mg/dL (ref 8–23)
CO2: 29 mmol/L (ref 22–32)
Calcium: 8.3 mg/dL — ABNORMAL LOW (ref 8.9–10.3)
Chloride: 97 mmol/L — ABNORMAL LOW (ref 98–111)
Creatinine, Ser: 0.86 mg/dL (ref 0.44–1.00)
GFR calc Af Amer: >60 mL/min (ref >60)
GFR calc non Af Amer: >60 mL/min (ref >60)
Glucose, Bld: 147 mg/dL — ABNORMAL HIGH (ref 70–99)
Potassium: 2.9 mmol/L — ABNORMAL LOW (ref 3.5–5.1)
Sodium: 138 mmol/L (ref 135–145)

## 2020-06-09 LAB — CBG MONITORING, ED: Glucose-Capillary: 124 mg/dL — ABNORMAL HIGH (ref 70–99)

## 2020-06-09 LAB — CBC
HCT: 43.1 % (ref 36.0–46.0)
Hemoglobin: 13.8 g/dL (ref 12.0–15.0)
MCH: 29.1 pg (ref 26.0–34.0)
MCHC: 32 g/dL (ref 30.0–36.0)
MCV: 90.9 fL (ref 80.0–100.0)
Platelets: 123 10*3/uL — ABNORMAL LOW (ref 150–400)
RBC: 4.74 MIL/uL (ref 3.87–5.11)
RDW: 13.7 % (ref 11.5–15.5)
WBC: 4.1 10*3/uL (ref 4.0–10.5)
nRBC: 0 % (ref 0.0–0.2)

## 2020-06-09 LAB — APTT: aPTT: 34 seconds (ref 24–36)

## 2020-06-09 LAB — GLUCOSE, CAPILLARY
Glucose-Capillary: 225 mg/dL — ABNORMAL HIGH (ref 70–99)
Glucose-Capillary: 340 mg/dL — ABNORMAL HIGH (ref 70–99)

## 2020-06-09 LAB — FERRITIN: Ferritin: 269 ng/mL (ref 11–307)

## 2020-06-09 LAB — TSH: TSH: <0.01 u[IU]/mL — ABNORMAL LOW (ref 0.350–4.500)

## 2020-06-09 LAB — PROTIME-INR
INR: 1.1 (ref 0.8–1.2)
Prothrombin Time: 13.7 seconds (ref 11.4–15.2)

## 2020-06-09 LAB — SARS CORONAVIRUS 2 BY RT PCR (HOSPITAL ORDER, PERFORMED IN ~~LOC~~ HOSPITAL LAB): SARS Coronavirus 2: POSITIVE — AB

## 2020-06-09 LAB — MAGNESIUM: Magnesium: 1.8 mg/dL (ref 1.7–2.4)

## 2020-06-09 LAB — FIBRINOGEN: Fibrinogen: 423 mg/dL (ref 210–475)

## 2020-06-09 LAB — D-DIMER, QUANTITATIVE: D-Dimer, Quant: 0.89 ug/mL-FEU — ABNORMAL HIGH (ref 0.00–0.50)

## 2020-06-09 LAB — PROCALCITONIN: Procalcitonin: <0.1 ng/mL

## 2020-06-09 LAB — TROPONIN I (HIGH SENSITIVITY)
Troponin I (High Sensitivity): 35 ng/L — ABNORMAL HIGH (ref <18)
Troponin I (High Sensitivity): 94 ng/L — ABNORMAL HIGH (ref <18)

## 2020-06-09 LAB — ECHOCARDIOGRAM LIMITED

## 2020-06-09 LAB — HIV ANTIBODY (ROUTINE TESTING W REFLEX): HIV Screen 4th Generation wRfx: NONREACTIVE

## 2020-06-09 LAB — LACTATE DEHYDROGENASE: LDH: 230 U/L — ABNORMAL HIGH (ref 98–192)

## 2020-06-09 LAB — BRAIN NATRIURETIC PEPTIDE: B Natriuretic Peptide: 120.4 pg/mL — ABNORMAL HIGH (ref 0.0–100.0)

## 2020-06-09 LAB — HEPATITIS B SURFACE ANTIGEN: Hepatitis B Surface Ag: NONREACTIVE

## 2020-06-09 MED ORDER — NYSTATIN 100000 UNIT/GM EX POWD
Freq: Two times a day (BID) | CUTANEOUS | Status: DC
  Filled 2020-06-09: qty 15

## 2020-06-09 MED ORDER — VANCOMYCIN HCL 2000 MG/400ML IV SOLN
2000.0000 mg | Freq: Once | INTRAVENOUS | Status: AC
  Administered 2020-06-09: 2000 mg via INTRAVENOUS
  Filled 2020-06-09 (×2): qty 400

## 2020-06-09 MED ORDER — POLYVINYL ALCOHOL 1.4 % OP SOLN
1.0000 [drp] | Freq: Four times a day (QID) | OPHTHALMIC | Status: DC | PRN
  Filled 2020-06-09: qty 15

## 2020-06-09 MED ORDER — DILTIAZEM HCL-DEXTROSE 125-5 MG/125ML-% IV SOLN (PREMIX)
5.0000 mg/h | INTRAVENOUS | Status: DC
  Filled 2020-06-09: qty 125

## 2020-06-09 MED ORDER — INSULIN ASPART 100 UNIT/ML ~~LOC~~ SOLN
0.0000 [IU] | Freq: Three times a day (TID) | SUBCUTANEOUS | Status: DC
  Administered 2020-06-09: 5 [IU] via SUBCUTANEOUS
  Administered 2020-06-09: 2 [IU] via SUBCUTANEOUS
  Administered 2020-06-10: 8 [IU] via SUBCUTANEOUS
  Administered 2020-06-10: 15 [IU] via SUBCUTANEOUS
  Administered 2020-06-10: 8 [IU] via SUBCUTANEOUS
  Administered 2020-06-11: 5 [IU] via SUBCUTANEOUS
  Administered 2020-06-11: 11 [IU] via SUBCUTANEOUS
  Administered 2020-06-11: 5 [IU] via SUBCUTANEOUS
  Administered 2020-06-12: 11 [IU] via SUBCUTANEOUS
  Administered 2020-06-12: 8 [IU] via SUBCUTANEOUS
  Administered 2020-06-12: 5 [IU] via SUBCUTANEOUS
  Administered 2020-06-13: 3 [IU] via SUBCUTANEOUS
  Administered 2020-06-13: 8 [IU] via SUBCUTANEOUS

## 2020-06-09 MED ORDER — VANCOMYCIN HCL IN DEXTROSE 1-5 GM/200ML-% IV SOLN
1000.0000 mg | Freq: Once | INTRAVENOUS | Status: DC
  Filled 2020-06-09: qty 200

## 2020-06-09 MED ORDER — POTASSIUM CHLORIDE CRYS ER 20 MEQ PO TBCR
40.0000 meq | EXTENDED_RELEASE_TABLET | ORAL | Status: AC
  Administered 2020-06-09: 40 meq via ORAL
  Filled 2020-06-09: qty 2

## 2020-06-09 MED ORDER — INSULIN ASPART PROT & ASPART (70-30 MIX) 100 UNIT/ML ~~LOC~~ SUSP
35.0000 [IU] | Freq: Two times a day (BID) | SUBCUTANEOUS | Status: DC
  Administered 2020-06-09: 35 [IU] via SUBCUTANEOUS
  Filled 2020-06-09: qty 10

## 2020-06-09 MED ORDER — ALBUTEROL SULFATE HFA 108 (90 BASE) MCG/ACT IN AERS
2.0000 | INHALATION_SPRAY | Freq: Four times a day (QID) | RESPIRATORY_TRACT | Status: DC
  Administered 2020-06-09 – 2020-06-13 (×14): 2 via RESPIRATORY_TRACT
  Filled 2020-06-09 (×2): qty 6.7

## 2020-06-09 MED ORDER — FUROSEMIDE 10 MG/ML IJ SOLN
40.0000 mg | Freq: Once | INTRAMUSCULAR | Status: AC
  Administered 2020-06-09: 40 mg via INTRAVENOUS
  Filled 2020-06-09: qty 4

## 2020-06-09 MED ORDER — LEVOTHYROXINE SODIUM 75 MCG PO TABS
175.0000 ug | ORAL_TABLET | Freq: Every day | ORAL | Status: DC
  Administered 2020-06-10 – 2020-06-13 (×4): 175 ug via ORAL
  Filled 2020-06-09 (×4): qty 1

## 2020-06-09 MED ORDER — SODIUM CHLORIDE 0.9 % IV SOLN
200.0000 mg | Freq: Once | INTRAVENOUS | Status: AC
  Administered 2020-06-09: 200 mg via INTRAVENOUS
  Filled 2020-06-09: qty 40

## 2020-06-09 MED ORDER — DEXAMETHASONE SODIUM PHOSPHATE 10 MG/ML IJ SOLN: 6.0000 mg | Freq: Once | INTRAMUSCULAR | Status: DC

## 2020-06-09 MED ORDER — ASCORBIC ACID 500 MG PO TABS
500.0000 mg | ORAL_TABLET | Freq: Every day | ORAL | Status: DC
  Administered 2020-06-09 – 2020-06-13 (×5): 500 mg via ORAL
  Filled 2020-06-09 (×5): qty 1

## 2020-06-09 MED ORDER — GABAPENTIN 300 MG PO CAPS
600.0000 mg | ORAL_CAPSULE | Freq: Two times a day (BID) | ORAL | Status: DC
  Administered 2020-06-09 – 2020-06-13 (×9): 600 mg via ORAL
  Filled 2020-06-09 (×9): qty 2

## 2020-06-09 MED ORDER — ENOXAPARIN SODIUM 150 MG/ML ~~LOC~~ SOLN
1.0000 mg/kg | Freq: Two times a day (BID) | SUBCUTANEOUS | Status: DC
  Administered 2020-06-09 – 2020-06-11 (×4): 150 mg via SUBCUTANEOUS
  Filled 2020-06-09 (×5): qty 1

## 2020-06-09 MED ORDER — ESCITALOPRAM OXALATE 20 MG PO TABS
20.0000 mg | ORAL_TABLET | Freq: Every day | ORAL | Status: DC
  Administered 2020-06-09 – 2020-06-10 (×2): 20 mg via ORAL
  Filled 2020-06-09: qty 1
  Filled 2020-06-09: qty 2
  Filled 2020-06-09: qty 1

## 2020-06-09 MED ORDER — HYDROCORTISONE 10 MG PO TABS: 10.0000 mg | ORAL_TABLET | Freq: Two times a day (BID) | ORAL | Status: DC

## 2020-06-09 MED ORDER — DIPHENHYDRAMINE-APAP (SLEEP) 25-500 MG PO TABS: 1.0000 | ORAL_TABLET | Freq: Every evening | ORAL | Status: DC | PRN

## 2020-06-09 MED ORDER — POTASSIUM CHLORIDE 10 MEQ/100ML IV SOLN
INTRAVENOUS | Status: AC
  Administered 2020-06-09: 10 meq via INTRAVENOUS
  Filled 2020-06-09: qty 100

## 2020-06-09 MED ORDER — SODIUM CHLORIDE 0.9% FLUSH
3.0000 mL | Freq: Once | INTRAVENOUS | Status: AC
  Administered 2020-06-09: 3 mL via INTRAVENOUS

## 2020-06-09 MED ORDER — HYDROCORTISONE 10 MG PO TABS
10.0000 mg | ORAL_TABLET | Freq: Every day | ORAL | Status: DC
  Administered 2020-06-09: 10 mg via ORAL
  Filled 2020-06-09: qty 1

## 2020-06-09 MED ORDER — METRONIDAZOLE IN NACL 5-0.79 MG/ML-% IV SOLN
500.0000 mg | Freq: Once | INTRAVENOUS | Status: AC
  Administered 2020-06-09: 500 mg via INTRAVENOUS
  Filled 2020-06-09: qty 100

## 2020-06-09 MED ORDER — ROPINIROLE HCL 1 MG PO TABS: 1.0000 mg | ORAL_TABLET | Freq: Two times a day (BID) | ORAL | Status: DC

## 2020-06-09 MED ORDER — FUROSEMIDE 10 MG/ML IJ SOLN
40.0000 mg | Freq: Two times a day (BID) | INTRAMUSCULAR | Status: DC
  Administered 2020-06-10: 40 mg via INTRAVENOUS
  Filled 2020-06-09: qty 4

## 2020-06-09 MED ORDER — DIPHENHYDRAMINE HCL 25 MG PO CAPS
25.0000 mg | ORAL_CAPSULE | Freq: Every evening | ORAL | Status: DC | PRN
  Administered 2020-06-09 – 2020-06-12 (×4): 25 mg via ORAL
  Filled 2020-06-09 (×4): qty 1

## 2020-06-09 MED ORDER — POTASSIUM CHLORIDE CRYS ER 20 MEQ PO TBCR
60.0000 meq | EXTENDED_RELEASE_TABLET | ORAL | Status: AC
  Administered 2020-06-09: 60 meq via ORAL
  Filled 2020-06-09: qty 3

## 2020-06-09 MED ORDER — INSULIN ASPART PROT & ASPART (70-30 MIX) 100 UNIT/ML ~~LOC~~ SUSP
50.0000 [IU] | Freq: Two times a day (BID) | SUBCUTANEOUS | Status: DC
  Administered 2020-06-10 – 2020-06-11 (×3): 50 [IU] via SUBCUTANEOUS
  Filled 2020-06-09: qty 10

## 2020-06-09 MED ORDER — DEXAMETHASONE SODIUM PHOSPHATE 10 MG/ML IJ SOLN
6.0000 mg | Freq: Once | INTRAMUSCULAR | Status: AC
  Administered 2020-06-09: 6 mg via INTRAVENOUS
  Filled 2020-06-09: qty 1

## 2020-06-09 MED ORDER — SODIUM CHLORIDE 0.9 % IV SOLN
100.0000 mg | Freq: Every day | INTRAVENOUS | Status: AC
  Administered 2020-06-10 – 2020-06-13 (×4): 100 mg via INTRAVENOUS
  Filled 2020-06-09 (×4): qty 20

## 2020-06-09 MED ORDER — SENNA 8.6 MG PO TABS
2.0000 | ORAL_TABLET | Freq: Every day | ORAL | Status: DC
  Administered 2020-06-12: 17.2 mg via ORAL
  Filled 2020-06-09 (×2): qty 2

## 2020-06-09 MED ORDER — SODIUM CHLORIDE 0.9 % IV SOLN
2.0000 g | Freq: Once | INTRAVENOUS | Status: AC
  Administered 2020-06-09: 2 g via INTRAVENOUS
  Filled 2020-06-09: qty 2

## 2020-06-09 MED ORDER — ROPINIROLE HCL 1 MG PO TABS
2.0000 mg | ORAL_TABLET | Freq: Every morning | ORAL | Status: DC
  Administered 2020-06-09 – 2020-06-13 (×5): 2 mg via ORAL
  Filled 2020-06-09 (×5): qty 2

## 2020-06-09 MED ORDER — DEXAMETHASONE 6 MG PO TABS
6.0000 mg | ORAL_TABLET | Freq: Every day | ORAL | Status: DC
  Administered 2020-06-10 – 2020-06-11 (×2): 6 mg via ORAL
  Filled 2020-06-09 (×2): qty 1

## 2020-06-09 MED ORDER — VANCOMYCIN HCL 1250 MG/250ML IV SOLN
1250.0000 mg | Freq: Two times a day (BID) | INTRAVENOUS | Status: DC
  Filled 2020-06-09 (×2): qty 250

## 2020-06-09 MED ORDER — ZINC SULFATE 220 (50 ZN) MG PO CAPS
220.0000 mg | ORAL_CAPSULE | Freq: Every day | ORAL | Status: DC
  Administered 2020-06-09 – 2020-06-13 (×5): 220 mg via ORAL
  Filled 2020-06-09 (×5): qty 1

## 2020-06-09 MED ORDER — SODIUM CHLORIDE 0.9% FLUSH
3.0000 mL | Freq: Two times a day (BID) | INTRAVENOUS | Status: DC
  Administered 2020-06-09 – 2020-06-13 (×9): 3 mL via INTRAVENOUS

## 2020-06-09 MED ORDER — ENOXAPARIN SODIUM 40 MG/0.4ML ~~LOC~~ SOLN
40.0000 mg | SUBCUTANEOUS | Status: DC
  Administered 2020-06-09: 40 mg via SUBCUTANEOUS
  Filled 2020-06-09: qty 0.4

## 2020-06-09 MED ORDER — SODIUM CHLORIDE 0.9 % IV SOLN
2.0000 g | Freq: Three times a day (TID) | INTRAVENOUS | Status: DC
  Administered 2020-06-09: 2 g via INTRAVENOUS
  Filled 2020-06-09: qty 2

## 2020-06-09 MED ORDER — HYDROCORTISONE 20 MG PO TABS
20.0000 mg | ORAL_TABLET | Freq: Every morning | ORAL | Status: DC
  Administered 2020-06-09 – 2020-06-10 (×2): 20 mg via ORAL
  Filled 2020-06-09 (×2): qty 1

## 2020-06-09 MED ORDER — IOHEXOL 350 MG/ML SOLN
100.0000 mL | Freq: Once | INTRAVENOUS | Status: AC | PRN
  Administered 2020-06-09: 75 mL via INTRAVENOUS

## 2020-06-09 MED ORDER — LACTATED RINGERS IV BOLUS (SEPSIS)
1000.0000 mL | Freq: Once | INTRAVENOUS | Status: AC
  Administered 2020-06-09: 1000 mL via INTRAVENOUS

## 2020-06-09 MED ORDER — INSULIN ASPART 100 UNIT/ML ~~LOC~~ SOLN
4.0000 [IU] | Freq: Once | SUBCUTANEOUS | Status: AC
  Administered 2020-06-09: 4 [IU] via SUBCUTANEOUS

## 2020-06-09 MED ORDER — ACETAMINOPHEN 325 MG PO TABS
650.0000 mg | ORAL_TABLET | Freq: Once | ORAL | Status: AC | PRN
  Administered 2020-06-09: 650 mg via ORAL
  Filled 2020-06-09: qty 2

## 2020-06-09 MED ORDER — GUAIFENESIN-DM 100-10 MG/5ML PO SYRP: 10.0000 mL | ORAL_SOLUTION | ORAL | Status: DC | PRN

## 2020-06-09 MED ORDER — ROPINIROLE HCL 1 MG PO TABS
1.0000 mg | ORAL_TABLET | Freq: Every day | ORAL | Status: DC
  Administered 2020-06-09 – 2020-06-12 (×4): 1 mg via ORAL
  Filled 2020-06-09 (×5): qty 1

## 2020-06-09 MED ORDER — POTASSIUM CHLORIDE 10 MEQ/100ML IV SOLN
10.0000 meq | INTRAVENOUS | Status: AC
  Administered 2020-06-09: 10 meq via INTRAVENOUS
  Filled 2020-06-09: qty 100

## 2020-06-09 NOTE — Sepsis Progress Note (Signed)
Notified bedside nurse of need to draw repeat lactic acid. 

## 2020-06-09 NOTE — ED Triage Notes (Signed)
Pt arrives via [facility] from home with c/o sob x2 hours, much worse with exertion. O2 sats 91% on room air, pt placed on 4L O2 via n.c. and sats improved to 95%. A/ox4, resp labored. Pt also endorsed nausea with ems, 4mg  zofran given en route. BP 190/110, ST 116, 98.4 temp, cbg 140, 18g IV RAC.

## 2020-06-09 NOTE — ED Notes (Signed)
ECHO at bedside.

## 2020-06-09 NOTE — Progress Notes (Addendum)
ANTICOAGULATION CONSULT NOTE - Initial Consult  Pharmacy Consult for Enoxaparin Indication: atrial fibrillation  Allergies  Allergen Reactions  . Metformin And Related Diarrhea  . Actos [Pioglitazone] Other (See Comments)    Congestive heart   . Adhesive [Tape] Other (See Comments)    Pulls off skin  . Enalapril Swelling  . Livalo [Pitavastatin] Other (See Comments)    Makes leg muscles weak   . Maxzide [Hydrochlorothiazide W-Triamterene] Swelling  . Morphine And Related Itching and Other (See Comments)    Mood changes  . Statins Other (See Comments)    Makes legs numb   . Zetia [Ezetimibe] Swelling  . Prostate [Nutritional Supplements] Hives and Rash    Patient Measurements: Total Body Weight: 151.1 kg Height: 62 inches  Vital Signs: Temp: 98.5 F (36.9 C) (07/14 1744) Temp Source: Oral (07/14 1744) BP: 130/58 (07/14 1744) Pulse Rate: 99 (07/14 1744)  Labs: Recent Labs    06/09/20 0602 06/09/20 0754  HGB 13.8  --   HCT 43.1  --   PLT 123*  --   APTT  --  34  LABPROT  --  13.7  INR  --  1.1  CREATININE 0.86  --   TROPONINIHS 35* 94*    Estimated Creatinine Clearance: 90.7 mL/min (by C-G formula based on SCr of 0.86 mg/dL).   Medical History: Past Medical History:  Diagnosis Date  . Diabetes mellitus without complication ([place])   . Edema   . Hypertension   . Hypothyroid   . Obesity     Assessment: 69 yr old female was admitted 06/09/20 with acute respiratory failure with hypoxia secondary to COVID infection. Pt also found to be in new-onset a fib. Pharmacy is consulted to dose enoxaparin for atrial fibrillation. Pt was on no anticoagulation PTA. She rec'd enoxaparin 40 mg SQ at 12:37 PM today.  H/H WNL, platelets 123, Scr 0.86, TBW CrCl >100 ml/min, d-dimer 0.89, INR 1.1  Goal of Therapy:  LMWH level: 0.6-1.2 IU/mL Prevention of stroke secondary to atrial fibrillation Monitor platelets by anticoagulation protocol: Yes   Plan:  Lovenox 1 mg/kg  (150 mg) SQ Q 12 hrs Monitor LWMH level 4 after fourth dose (goal: 0.6-1.2 IU/mL) Monitor CBC Monitor for signs/symptoms of bleeding F/U transition to oral anticoagulant  Gillermina Hu, PharmD, BCPS, [facility] Clinical Pharmacist 06/09/2020,7:02 PM

## 2020-06-09 NOTE — ED Notes (Signed)
Lunch tray delivered.

## 2020-06-09 NOTE — ED Provider Notes (Signed)
[facility] EMERGENCY DEPARTMENT Provider Note   CSN: [id number] Arrival date & time: 06/09/20  0548    History Chief Complaint  Patient presents with  . Shortness of Breath    Kathy Simpson is a 69 y.o. female with past medical history significant for diabetes, hypertension, obesity, CHF, not on diuretics who presents for evaluation of shortness of breath.  Has had some shortness of breath however increased about 2 hours  prior to arrival.  1 episode of emesis yesterday which is NBNB.  Cough yesterday however none today.  Does not sleep flat at baseline.  Does not use chronic oxygen however on arrival with EMS she is on 3 L.  Patient febrile, tachycardic.  Did not receive Covid vaccine.  Denies fever at home, congestion, rhinorrhea, chest pain, abdominal pain, diarrhea, dysuria, rashes or lesions.  Had some generalized weakness over the last 24 hours.  Denies additional aggravating or relieving factors.  History obtained from patient, family in room and prior medical records.  No interpreter is used.  HPI     Past Medical History:  Diagnosis Date  . Diabetes mellitus without complication ([place])   . Edema   . Hypertension   . Hypothyroid   . Obesity     Patient Active Problem List   Diagnosis Date Noted  . Cellulitis 05/09/2016  . Wound infection 05/09/2016  . Sepsis ([place]) 05/09/2016  . Hypertension   . Diabetes mellitus without complication ([place])   . Obesity   . Hypothyroid   . Thyroid activity decreased   . Chronic systolic CHF (congestive heart failure) ([place]) 09/21/2014  . OSA (obstructive sleep apnea) 09/15/2014  . Ankle fracture, left 09/15/2014  . Acute renal failure ([place]) 09/14/2014  . Shock ([place]) 09/13/2014    Past Surgical History:  Procedure Laterality Date  . BACK SURGERY    . BREAST SURGERY     breast reduction  . INCISION AND DRAINAGE OF WOUND Right 05/10/2016   Procedure: IRRIGATION AND DEBRIDEMENT WOUND;  Surgeon: Judeth Horn, MD;   Location: [place];  Service: General;  Laterality: Right;  . KNEE SURGERY    . PITUITARY SURGERY  1984  . REDUCTION MAMMAPLASTY Bilateral 1990  . TUBAL LIGATION    . WRIST SURGERY       OB History   No obstetric history on file.     Family History  Problem Relation Age of Onset  . CAD Mother        s/p of CABG  . Hypertension Mother   . Lung cancer Mother   . Lung cancer Father   . Bronchitis Sister     Social History   Tobacco Use  . Smoking status: Never Smoker  . Smokeless tobacco: Never Used  Substance Use Topics  . Alcohol use: No  . Drug use: No    Home Medications Prior to Admission medications   Medication Sig Start Date End Date Taking? Authorizing Provider  acetaminophen (TYLENOL) 500 MG tablet Take 1 tablet (500 mg total) by mouth every 6 (six) hours as needed for mild pain or headache. 05/15/16  Yes Buriev, Arie Sabina, MD  diphenhydramine-acetaminophen (TYLENOL PM) 25-500 MG TABS tablet Take 1 tablet by mouth at bedtime as needed (sleep).   Yes [provider]  Dulaglutide (TRULICITY) 3 XB/9.3JQ SOPN Inject 3 mg into the skin once a week. 12/16/19  Yes [provider]  escitalopram (LEXAPRO) 20 MG tablet Take 20 mg by mouth daily.   Yes [provider]  furosemide (LASIX) 40 MG tablet Take 1 tablet (40 mg total) by mouth daily. Patient taking differently: Take 40 mg by mouth every other day.  09/24/14  Yes Tat, Shanon Brow, MD  gabapentin (NEURONTIN) 300 MG capsule Take 600 mg by mouth 2 (two) times daily.   Yes [provider]  hydrocortisone (CORTEF) 10 MG tablet Take 10-20 mg by mouth 2 (two) times daily. 20mg  in the morning and 10mg  at bedtime   Yes [provider]  ibuprofen (ADVIL,MOTRIN) 800 MG tablet Take 800 mg by mouth every 8 (eight) hours as needed for moderate pain.   Yes [provider]  insulin isophane & regular human (NOVOLIN 70/30 FLEXPEN) (70-30) 100 UNIT/ML KwikPen Inject 50 Units into the skin in  the morning and at bedtime.   Yes [provider]  levothyroxine (SYNTHROID, LEVOTHROID) 175 MCG tablet Take 175 mcg by mouth daily before breakfast.   Yes [provider]  Polyvinyl Alcohol-Povidone (REFRESH OP) Apply 1 drop to eye in the morning, at noon, and at bedtime.   Yes [provider]  rOPINIRole (REQUIP) 1 MG tablet Take 1-2 mg by mouth 2 (two) times daily. 2mg  in the am , and 1mg  hs.   Yes [provider]  senna (SENOKOT) 8.6 MG TABS tablet Take 2 tablets (17.2 mg total) by mouth at bedtime. 05/15/16  Yes Kinnie Feil, MD  amoxicillin-clavulanate (AUGMENTIN) 875-125 MG tablet Take 1 tablet by mouth 2 (two) times daily. Patient not taking: Reported on 06/09/2020 05/15/16   Kinnie Feil, MD  aspirin EC 81 MG tablet Take 81 mg by mouth daily with breakfast. Patient not taking: Reported on 06/09/2020    [provider]  Empagliflozin (JARDIANCE PO) Take 1 tablet by mouth daily. Patient not taking: Reported on 06/09/2020    [provider]  insulin aspart (NOVOLOG) 100 UNIT/ML injection Inject 0-9 Units into the skin 3 (three) times daily with meals. Patient not taking: Reported on 06/09/2020 05/15/16   Kinnie Feil, MD  insulin glargine (LANTUS) 100 UNIT/ML injection Inject 0.1 mLs (10 Units total) into the skin at bedtime. Patient not taking: Reported on 06/09/2020 05/15/16   Kinnie Feil, MD  liver oil-zinc oxide (DESITIN) 40 % ointment Apply 1 application topically as needed for irritation. Patient not taking: Reported on 06/09/2020    [provider]  oxyCODONE-acetaminophen (PERCOCET/ROXICET) 5-325 MG tablet Take 1 tablet by mouth every 4 (four) hours as needed for severe pain. Patient not taking: Reported on 06/09/2020 05/15/16   Kinnie Feil, MD  polyethylene glycol (MIRALAX / GLYCOLAX) packet Take 17 g by mouth daily as needed for mild constipation. Patient not taking: Reported on 06/09/2020    [provider]  spironolactone (ALDACTONE) 25 MG tablet Take 1 tablet (25 mg total) by mouth daily. Patient not taking: Reported on 06/09/2020 09/24/14   Orson Eva, MD    Allergies    Metformin and related, Actos [pioglitazone], Adhesive [tape], Enalapril, Livalo [pitavastatin], Maxzide [hydrochlorothiazide w-triamterene], Morphine and related, Statins, Zetia [ezetimibe], and Prostate [nutritional supplements]  Review of Systems   Review of Systems  Constitutional: Positive for activity change, chills and fatigue. Negative for appetite change, diaphoresis and fever.  HENT: Negative.   Respiratory: Positive for choking and shortness of breath. Negative for apnea, cough, chest tightness, wheezing and stridor.   Cardiovascular: Negative.   Gastrointestinal: Positive for vomiting. Negative for abdominal distention, abdominal pain, anal bleeding, blood in stool, constipation, diarrhea, nausea  and rectal pain.  Genitourinary: Negative.   Musculoskeletal: Negative.   Skin: Negative.   Neurological: Positive for weakness (Generalized). Negative for dizziness, tremors, seizures, syncope, facial asymmetry, speech difficulty, light-headedness, numbness and headaches.  All other systems reviewed and are negative.   Physical Exam Updated Vital Signs BP 110/63   Pulse (!) 104   Temp (!) 102.8 F (39.3 C) (Oral)   Resp (!) 25   SpO2 98%   Physical Exam Vitals and nursing note reviewed.  Constitutional:      General: She is not in acute distress.    Appearance: She is well-developed. She is obese. She is ill-appearing. She is not toxic-appearing or diaphoretic.     Comments: 3 L [place]  HENT:     Head: Normocephalic and atraumatic.     Jaw: There is normal jaw occlusion.     Right Ear: Tympanic membrane, ear canal and external ear normal. There is no impacted cerumen. No hemotympanum. Tympanic membrane is not injected, scarred, perforated, erythematous, retracted or bulging.     Left Ear:  Tympanic membrane, ear canal and external ear normal. There is no impacted cerumen. No hemotympanum. Tympanic membrane is not injected, scarred, perforated, erythematous, retracted or bulging.     Ears:     Comments: No Mastoid tenderness.    Nose:     Comments: Clear rhinorrhea and congestion to bilateral nares.  No sinus tenderness.    Mouth/Throat:     Lips: Pink.     Mouth: Mucous membranes are dry.     Pharynx: Oropharynx is clear.     Comments: Posterior oropharynx clear. Tonsils without erythema or exudate. No evidence of PTA or RPA.  No drooling, dysphasia or trismus.  Phonation normal. Dry mucous membranes Eyes:     Extraocular Movements: Extraocular movements intact.     Comments: No nystagmus  Neck:     Trachea: Trachea and phonation normal.     Meningeal: Brudzinski's sign and Kernig's sign absent.     Comments: No Neck stiffness or neck rigidity.  No meningismus.  No cervical lymphadenopathy. Cardiovascular:     Rate and Rhythm: Tachycardia present. Rhythm irregularly irregular.     Pulses: Normal pulses.          Radial pulses are 2+ on the right side and 2+ on the left side.       Dorsalis pedis pulses are 2+ on the right side and 2+ on the left side.       Posterior tibial pulses are 2+ on the right side and 2+ on the left side.     Heart sounds: Normal heart sounds.     Comments: No murmurs rubs or gallops. New onset Afib with RVR. Pulmonary:     Effort: Tachypnea present.     Comments: Clear to auscultation bilaterally without wheeze, rhonchi or rales however difficulty to access due to chest wall. No accessory muscle usage.  Able speak in full sentences. Chest:     Comments: Normal rise and fall of chest. No crepitus, strep offs.  Abdominal:     General: Bowel sounds are normal.     Palpations: Abdomen is soft.     Tenderness: There is no abdominal tenderness. There is no right CVA tenderness, left CVA tenderness, guarding or rebound. Negative signs include  [name]'s sign and McBurney's sign.     Hernia: No hernia is present.     Comments: Soft, nontender without rebound or guarding.  No CVA tenderness.  Musculoskeletal:  Cervical back: Full passive range of motion without pain and normal range of motion.     Comments: Moves all 4 extremities without difficulty.  Lower extremities without edema, erythema or warmth. Unable to assess sacral region 2/2 body habitus.  Skin:    General: Skin is warm.     Capillary Refill: Capillary refill takes 2 to 3 seconds.     Comments: Brisk capillary refill.  No rashes or lesions. Thickened area on bilateral lateral aspect of gluteal region without fluctuance, erythema, warmth. No obvious cellulitis or abscess  Neurological:     General: No focal deficit present.     Mental Status: She is alert.     Cranial Nerves: Cranial nerves are intact.     Sensory: Sensation is intact.     Motor: Motor function is intact.     Coordination: Coordination is intact.     Comments: Cranial nerves II through XII grossly intact.  No facial droop.  Moves all 4 extremities equally. Intact sensation. / grip strength bilaterally.    ED Results / Procedures / Treatments   Labs (all labs ordered are listed, but only abnormal results are displayed) Labs Reviewed  SARS CORONAVIRUS 2 BY RT PCR ([facility]) - Abnormal; Notable for the following components:      Result Value   SARS Coronavirus 2 POSITIVE (*)    All other components within normal limits  BASIC METABOLIC PANEL - Abnormal; Notable for the following components:   Potassium 2.9 (*)    Chloride 97 (*)    Glucose, Bld 147 (*)    Calcium 8.3 (*)    All other components within normal limits  CBC - Abnormal; Notable for the following components:   Platelets 123 (*)    All other components within normal limits  LACTIC ACID, PLASMA - Abnormal; Notable for the following components:   Lactic Acid, Venous 3.1 (*)    All other  components within normal limits  BRAIN NATRIURETIC PEPTIDE - Abnormal; Notable for the following components:   B Natriuretic Peptide 120.4 (*)    All other components within normal limits  D-DIMER, QUANTITATIVE (NOT AT [facility]) - Abnormal; Notable for the following components:   D-Dimer, Quant 0.89 (*)    All other components within normal limits  HEPATIC FUNCTION PANEL - Abnormal; Notable for the following components:   Total Protein 6.3 (*)    Albumin 2.9 (*)    All other components within normal limits  TROPONIN I (HIGH SENSITIVITY) - Abnormal; Notable for the following components:   Troponin I (High Sensitivity) 35 (*)    All other components within normal limits  TROPONIN I (HIGH SENSITIVITY) - Abnormal; Notable for the following components:   Troponin I (High Sensitivity) 94 (*)    All other components within normal limits  CULTURE, BLOOD (ROUTINE X 2)  CULTURE, BLOOD (ROUTINE X 2)  LACTIC ACID, PLASMA  APTT  PROTIME-INR  MAGNESIUM  URINALYSIS, ROUTINE W REFLEX MICROSCOPIC  PROCALCITONIN    EKG EKG Interpretation  Date/Time:  Wednesday June 09 2020 05:53:21 EDT Ventricular Rate:  132 PR Interval:    QRS Duration: 100 QT Interval:  316 QTC Calculation: 468 R Axis:   -36 Text Interpretation: Atrial fibrillation with rapid ventricular response Left axis deviation Minimal voltage criteria for LVH, may be normal variant ( Cornell product ) Septal infarct , age undetermined Abnormal ECG When compared with ECG of EARLIER SAME DATE No significant change was found  Confirmed by Delora Fuel ([id number]) on 06/09/2020 7:00:16 AM   Radiology DG Chest 2 View  Result Date: 06/09/2020 CLINICAL DATA:  Shortness of breath for 2 hours EXAM: CHEST - 2 VIEW COMPARISON:  05/09/2016 FINDINGS: Cardiomegaly with the generalized interstitial prominence above prior baseline. No air bronchogram, effusion, or pneumothorax IMPRESSION: Cardiomegaly with probable mild edema. Electronically Signed   By:  Monte Fantasia M.D.   On: 06/09/2020 06:43   CT Angio Chest PE W and/or Wo Contrast  Result Date: 06/09/2020 CLINICAL DATA:  Shortness of breath EXAM: CT ANGIOGRAPHY CHEST WITH CONTRAST TECHNIQUE: Multidetector CT imaging of the chest was performed using the standard protocol during bolus administration of intravenous contrast. Multiplanar CT image reconstructions and MIPs were obtained to evaluate the vascular anatomy. CONTRAST:  57mL OMNIPAQUE IOHEXOL 350 MG/ML SOLN COMPARISON:  Chest x-ray today FINDINGS: Cardiovascular: Cardiomegaly. Small pericardial effusion. Coronary artery calcifications most pronounced in the left main, left anterior descending and left circumflex coronary arteries. Aortic atherosclerosis. No aneurysm. No evidence of pulmonary embolus. Mediastinum/Nodes: No mediastinal, hilar, or axillary adenopathy. Trachea and esophagus are unremarkable. Thyroid unremarkable. Lungs/Pleura: Mild vascular congestion. Scattered mild hazy opacities in the perihilar and lower lobe regions may reflect early interstitial edema. 9 mm nodule along the right minor fissure. Nodular airspace opacity also seen in the right middle lobe adjacent to the major fissure measuring up to 17 mm, favor inflammatory/infectious. Upper Abdomen: Imaging into the upper abdomen shows no acute findings. Musculoskeletal: Chest wall soft tissues are unremarkable. No acute bony abnormality. Review of the MIP images confirms the above findings. IMPRESSION: Cardiomegaly, vascular congestion mild perihilar hazy opacities which could reflect early interstitial edema. No evidence of pulmonary embolus. Small pericardial effusion. 9 mm nodule along the right minor fissure. 17 mm nodular airspace opacity in the right middle lobe. Favor inflammatory/infectious. These could be followed with repeat chest CT in 6 months to ensure resolution/stability. Electronically Signed   By: Rolm Baptise M.D.   On: 06/09/2020 09:55     Procedures .Critical Care Performed by: Nettie Elm, PA-C Authorized by: Nettie Elm, PA-C   Critical care provider statement:    Critical care time (minutes):  45   Critical care was necessary to treat or prevent imminent or life-threatening deterioration of the following conditions:  Respiratory failure, sepsis and circulatory failure   Critical care was time spent personally by me on the following activities:  Discussions with consultants, evaluation of patient's response to treatment, examination of patient, ordering and performing treatments and interventions, ordering and review of laboratory studies, ordering and review of radiographic studies, pulse oximetry, re-evaluation of patient's condition, obtaining history from patient or surrogate and review of old charts   (including critical care time)  Medications Ordered in ED Medications  potassium chloride 10 mEq in 100 mL IVPB (10 mEq Intravenous New Bag/Given 06/09/20 1015)  vancomycin (VANCOREADY) IVPB 2000 mg/400 mL (2,000 mg Intravenous New Bag/Given 06/09/20 1013)  vancomycin (VANCOREADY) IVPB 1250 mg/250 mL (has no administration in time range)  ceFEPIme (MAXIPIME) 2 g in sodium chloride 0.9 % 100 mL IVPB (has no administration in time range)  sodium chloride flush (NS) 0.9 % injection 3 mL (3 mLs Intravenous Given 06/09/20 0736)  acetaminophen (TYLENOL) tablet 650 mg (650 mg Oral Given 06/09/20 0603)  lactated ringers bolus 1,000 mL (1,000 mLs Intravenous New Bag/Given 06/09/20 0827)  ceFEPIme (MAXIPIME) 2 g in sodium chloride 0.9 % 100 mL IVPB (0 g Intravenous Stopped 06/09/20 0933)  metroNIDAZOLE (FLAGYL)  IVPB 500 mg (0 mg Intravenous Stopped 06/09/20 1007)  iohexol (OMNIPAQUE) 350 MG/ML injection 100 mL (75 mLs Intravenous Contrast Given 06/09/20 0913)    ED Course  I have reviewed the triage vital signs and the nursing notes.  Pertinent labs & imaging results that were available during my care of the patient  were reviewed by me and considered in my medical decision making (see chart for details).  25 female arrives for evaluation of shortness of breath.  On arrival patient is found febrile, tachycardic, tachypneic and hypoxic with new onset 3 L nasal cannula requirement.  Did not get Covid vaccine.  No obvious signs of skin infection on exam.  Her heart and lungs are clear however difficult to examine due to body habitus.  Abdomen soft, nontender.  She has no urinary complaints.  Unfortunately not able to assess her sacral region due to body habitus.  She does have thickened area on bilateral lateral aspect gluteal folds however they do not appear cellulitic without any obvious abscess.  Patient with new onset A. fib with RVR.  Given vital signs, code sepsis called on initial evaluation.  She was given broad-spectrum antibiotics and 1 L of normal saline due to patient appears very dry.  She did have work-up started from the waiting room.  The lateral leg swelling, redness warmth.  No prior history of PE or DVT  Personally reviewed and interpreted labs and imaging: Chest x-ray with cardiomegaly and possible pulmonary edema Lactic acid 1.7 CBC without leukocytosis at 4.1 Metabolic panel with mild hypokalemia to 2.9, hyperglycemia to 147 we will check magnesium and replace potassium Trop 35, denies CP question demand from new onset afib  Patient returned to sinus tachycardia at 108 on reevaluation.  The monitoring heart rate after some gentle fluid bolus.  Chadsvasc score 5-defer to admitting team for anticoagulation evaluation  Patient reassessed.  Covid positive.  Will need admission for acute hypoxic respiratory failure.  Lactic acid was 3.1 however had not gotten her fluid bolus.  We will plan on repeat after fluid bolus.  Troponins trending up questions Sepsis vs tachycardia vs demand.  Remdesivir per pharm ordered.  CONSULT with Dr. Tamala Julian with [facility] who will evaluate patient for admission.  JONTAVIA LEATHERBURY was evaluated in Emergency Department on 06/09/2020 for the symptoms described in the history of present illness. She was evaluated in the context of the global COVID-19 pandemic, which necessitated consideration that the patient might be at risk for infection with the SARS-CoV-2 virus that causes COVID-19. Institutional protocols and algorithms that pertain to the evaluation of patients at risk for COVID-19 are in a state of rapid change based on information released by regulatory bodies including the CDC and federal and state organizations. These policies and algorithms were followed during the patient's care in the ED.  Clinical Course as of Jun 09 1040  Wed Jun 09, 2020  0957 This is a 69 year old female with a history of morbid obesity, diabetes, congestive heart failure, presented to emergency department with shortness of breath and fatigue for the past 2 weeks.  She is not sure exactly when it began.  She has noted she has been more short of breath than usual for the past several days, abruptly worsening yesterday.  She has not received Covid vaccines.  She lives by herself but her daughter lives next door checks on her several times a day.  On initial presentation the patient was febrile and tachycardic, tachypneic and mildly hypoxic requiring  2 L nasal cannula.  Her lactate was elevated.  Initially a sepsis work-up was initiated with broad-spectrum antibiotics ordered.  Subsequently her Covid test came back positive, which I now suspect is likely the cause of all of her symptoms.  She is pending radiologist read for her CT PE study. Repeat ECG pending as initial had poor baseline.  Trops 30s -> 94, possible mild demand ischemia but not consistent with an infarct.   [MT]    Clinical Course User Index [MT] Trifan, Carola Rhine, MD   MDM Rules/Calculators/A&P                          CHA2DS2-VASc Score = 5  The patient's score is based upon: CHF History: 1 HTN History: 1 Age : 1 Diabetes  History: 1 Stroke History: 0 Vascular Disease History: 0 Gender: 1      ASSESSMENT AND PLAN: Paroxysmal Atrial Fibrillation (ICD10:  I48.0) The patient's CHA2DS2-VASc score is 5, indicating a 7.2% annual risk of stroke.  Defer to admitting team for anticoagulation  Secondary Hypercoagulable State (ICD10:  [phone]) The patient is at significant risk for stroke/thromboembolism based upon her CHA2DS2-VASc Score of 5.  Defer to admitting team Defer to admitting team.    Signed,  Harvest Deist A Demyan Fugate, PA-C    06/09/2020 10:41 AM   Final Clinical Impression(s) / ED Diagnoses Final diagnoses:  COVID-19  Sepsis with acute hypoxic respiratory failure without septic shock, due to unspecified organism ([place])  Elevated troponin  Elevated lactic acid level  Acute pulmonary edema (HCC)  Atrial fibrillation with RVR [facility])    Rx / DC Orders ED Discharge Orders    None       Debera Sterba A, PA-C 06/09/20 1041    Wyvonnia Dusky, MD 06/09/20 1807

## 2020-06-09 NOTE — Progress Notes (Signed)
Pharmacy Antibiotic Note  Kathy Simpson is a 69 y.o. female admitted on 06/09/2020 with sepsis.  Pharmacy has been consulted for vancomycin and cefepime dosing.  Febrile, tachy, WBC wnl, LA 1.7  Plan: Vancomycin 2g IV x 1, then 1250mg  IV every 12 hours (target vancomycin trough 15-20) Cefepime 2g IV every 8 hours Monitor renal function, Cx and clinical progression to narrow Vancomycin trough at steady state     Temp (24hrs), Avg:102.8 F (39.3 C), Min:102.8 F (39.3 C), Max:102.8 F (39.3 C)  Recent Labs  Lab 06/09/20 0602 06/09/20 0605  WBC 4.1  --   CREATININE 0.86  --   LATICACIDVEN  --  1.7    CrCl cannot be calculated (Unknown ideal weight.).    Allergies  Allergen Reactions  . Metformin And Related Diarrhea  . Actos [Pioglitazone] Other (See Comments)    Congestive heart   . Adhesive [Tape] Other (See Comments)    Pulls off skin  . Enalapril Swelling  . Livalo [Pitavastatin] Other (See Comments)    Makes leg muscles weak   . Maxzide [Hydrochlorothiazide W-Triamterene] Swelling  . Morphine And Related Itching and Other (See Comments)    Mood changes  . Statins Other (See Comments)    Makes legs numb   . Zetia [Ezetimibe] Swelling  . Prostate [Nutritional Supplements] Hives and Rash    Antimicrobials this admission: Vanco 7/14>> Cefepime 7/14>>  Dose adjustments this admission: na  Microbiology results: 7/14 BCx: sent  Bertis Ruddy, PharmD Clinical Pharmacist ED Pharmacist Phone # [phone] 06/09/2020 7:49 AM

## 2020-06-09 NOTE — Progress Notes (Signed)
Kathy Simpson [id number] Admission Data: 06/09/2020 6:13 PM Attending Provider: Norval Morton, MD  OFH:[id number], Kathy Reichmann, MD Consults/ Treatment Team:   Kathy Simpson is a 69 y.o. female patient admitted from ED awake, alert  & orientated  X 3,  Full Code, VSS - Blood pressure (!) 130/58, pulse 99, temperature 98.5 F (36.9 C), temperature source Oral, resp. rate 18, SpO2 96 %., O2  2 L nasal cannular, no c/o shortness of breath, no c/o chest pain, no distress noted. Tele # 16 placed and pt is currently running:normal sinus rhythm.   IV site WDL:  hand right, condition patent and no redness and left, condition patent and no redness and forearm right, condition patent and no redness and left, condition patent and no redness with a transparent dsg that's clean dry and intact.  Allergies:   Allergies  Allergen Reactions  . Metformin And Related Diarrhea  . Actos [Pioglitazone] Other (See Comments)    Congestive heart   . Adhesive [Tape] Other (See Comments)    Pulls off skin  . Enalapril Swelling  . Livalo [Pitavastatin] Other (See Comments)    Makes leg muscles weak   . Maxzide [Hydrochlorothiazide W-Triamterene] Swelling  . Morphine And Related Itching and Other (See Comments)    Mood changes  . Statins Other (See Comments)    Makes legs numb   . Zetia [Ezetimibe] Swelling  . Prostate [Nutritional Supplements] Hives and Rash     Past Medical History:  Diagnosis Date  . Diabetes mellitus without complication ([place])   . Edema   . Hypertension   . Hypothyroid   . Obesity      Pt orientation to unit, room and routine. Information packet given to patient/family and safety video watched.  Admission INP armband ID verified with patient/family, and in place. SR up x 2, fall risk assessment complete with Patient and family verbalizing understanding of risks associated with falls. Pt verbalizes an understanding of how to use the call bell and to call for help before getting out of bed.   Some redness noted in right, lower knee.  No evidence of skin break down noted on exam.   Will cont to monitor and assist as needed.  Hosie Spangle, [place] 06/09/2020 6:13 PM

## 2020-06-09 NOTE — ED Notes (Signed)
ED Provider at bedside. 

## 2020-06-09 NOTE — H&P (Addendum)
History and Physical    Kathy Simpson Kathy Simpson DOB: 01/13/1951 Kathy Simpson  Referring MD/NP/PA:Britini Rubye Oaks, PA-C PCP: Kathy Orn, MD  Patient coming from: home via EMS  Chief Complaint: Shortness of breath  I have personally briefly reviewed patient's old medical records in [place]   HPI: Kathy Simpson is a 69 y.o. female with medical history significant of hypertension, hypothyroidism, diabetes mellitus type 2, and obesity presented with acute shortness of breath overnight.  She reports that she has been using diuretics intermittently for swelling with some improvement. However, she tried taking 2 of her furosemide pills without the symptoms this morning.  She called her daughter who called 28.  Patient notes that she had gained weight with COVID-19 pandemic and basically been homebound and sedentary.    She did not receive her Covid vaccines as she was fearful.  She has had associated symptoms of nausea, one episode of vomiting, headache, and right hip pain.  Denied complaints of fever, abdominal pain, chest pain, recent falls, or dysuria.  She does also note a rash under her right. She had only been interacting with family, but daughter came back positive for Covid-19.  ED Course:  Upon admission into the emergency department patient was seen to be febrile up to 102.8 F, pulse elevated to 125, respirations 25-29, and O2 saturations currently maintained on 3 L nasal cannula oxygen.  Labs significant for platelet count 123, potassium 2.9, lactic acid 3.1, troponin 35-> 94, BNP 120.4, and D-dimer 0.89.sepsis protocol has been initiated and patient has been started on empiric antibiotics of vancomycin, metronidazole, and cefepime.  Chest x-ray concerning for cardiomegaly with mild pulmonary edema.  Review of Systems  Constitutional: Positive for malaise/fatigue. Negative for fever.  HENT: Negative for congestion and nosebleeds.   Eyes: Negative for photophobia and  pain.  Respiratory: Positive for shortness of breath.   Cardiovascular: Positive for leg swelling. Negative for chest pain.  Gastrointestinal: Negative for abdominal pain, nausea and vomiting.  Genitourinary: Negative for dysuria and frequency.  Musculoskeletal: Positive for joint pain and myalgias.  Skin: Positive for rash.  Neurological: Positive for headaches. Negative for dizziness and weakness.  Psychiatric/Behavioral: Negative for substance abuse. The patient has insomnia.     Past Medical History:  Diagnosis Date  . Diabetes mellitus without complication (Kathy Simpson)   . Edema   . Hypertension   . Hypothyroid   . Obesity     Past Surgical History:  Procedure Laterality Date  . BACK SURGERY    . BREAST SURGERY     breast reduction  . INCISION AND DRAINAGE OF WOUND Right 05/10/2016   Procedure: IRRIGATION AND DEBRIDEMENT WOUND;  Surgeon: Kathy Horn, MD;  Location: [facility];  Service: General;  Laterality: Right;  . KNEE SURGERY    . PITUITARY SURGERY  1984  . REDUCTION MAMMAPLASTY Bilateral 1990  . TUBAL LIGATION    . WRIST SURGERY       reports that she has never smoked. She has never used smokeless tobacco. She reports that she does not drink alcohol and does not use drugs.  Allergies  Allergen Reactions  . Metformin And Related Diarrhea  . Actos [Pioglitazone] Other (See Comments)    Congestive heart   . Adhesive [Tape] Other (See Comments)    Pulls off skin  . Enalapril Swelling  . Livalo [Pitavastatin] Other (See Comments)    Makes leg muscles weak   . Maxzide [Hydrochlorothiazide W-Triamterene] Swelling  . Morphine And Related Itching  and Other (See Comments)    Mood changes  . Statins Other (See Comments)    Makes legs numb   . Zetia [Ezetimibe] Swelling  . Prostate [Nutritional Supplements] Hives and Rash    Family History  Problem Relation Age of Onset  . CAD Mother        s/p of CABG  . Hypertension Mother   . Lung cancer Mother   . Lung cancer Father    . Bronchitis Sister     Prior to Admission medications   Medication Sig Start Date End Date Taking? Authorizing Provider  acetaminophen (TYLENOL) 500 MG tablet Take 1 tablet (500 mg total) by mouth every 6 (six) hours as needed for mild pain or headache. 05/15/16  Yes Kathy Simpson, Kathy Sabina, MD  diphenhydramine-acetaminophen (TYLENOL PM) 25-500 MG TABS tablet Take 1 tablet by mouth at bedtime as needed (sleep).   Yes [provider]  Dulaglutide (TRULICITY) 3 PQ/3.3AQ SOPN Inject 3 mg into the skin once a week. 12/16/19  Yes [provider]  escitalopram (LEXAPRO) 20 MG tablet Take 20 mg by mouth daily.   Yes [provider]  furosemide (LASIX) 40 MG tablet Take 1 tablet (40 mg total) by mouth daily. Patient taking differently: Take 40 mg by mouth every other day.  09/24/14  Yes Kathy Simpson, Kathy Brow, MD  gabapentin (NEURONTIN) 300 MG capsule Take 600 mg by mouth 2 (two) times daily.   Yes [provider]  hydrocortisone (CORTEF) 10 MG tablet Take 10-20 mg by mouth 2 (two) times daily. 20mg  in the morning and 10mg  at bedtime   Yes [provider]  ibuprofen (ADVIL,MOTRIN) 800 MG tablet Take 800 mg by mouth every 8 (eight) hours as needed for moderate pain.   Yes [provider]  insulin isophane & regular human (NOVOLIN 70/30 FLEXPEN) (70-30) 100 UNIT/ML KwikPen Inject 50 Units into the skin in the morning and at bedtime.   Yes [provider]  levothyroxine (SYNTHROID, LEVOTHROID) 175 MCG tablet Take 175 mcg by mouth daily before breakfast.   Yes [provider]  Polyvinyl Alcohol-Povidone (REFRESH OP) Apply 1 drop to eye in the morning, at noon, and at bedtime.   Yes [provider]  rOPINIRole (REQUIP) 1 MG tablet Take 1-2 mg by mouth 2 (two) times daily. 2mg  in the am , and 1mg  hs.   Yes [provider]  senna (SENOKOT) 8.6 MG TABS tablet Take 2 tablets (17.2 mg total) by mouth at bedtime. 05/15/16  Yes Kathy Feil,  MD  amoxicillin-clavulanate (AUGMENTIN) 875-125 MG tablet Take 1 tablet by mouth 2 (two) times daily. Patient not taking: Reported on Simpson 05/15/16   Kathy Feil, MD  aspirin EC 81 MG tablet Take 81 mg by mouth daily with breakfast. Patient not taking: Reported on Simpson    [provider]  Empagliflozin (JARDIANCE PO) Take 1 tablet by mouth daily. Patient not taking: Reported on Simpson    [provider]  insulin aspart (NOVOLOG) 100 UNIT/ML injection Inject 0-9 Units into the skin 3 (three) times daily with meals. Patient not taking: Reported on Simpson 05/15/16   Kathy Feil, MD  insulin glargine (LANTUS) 100 UNIT/ML injection Inject 0.1 mLs (10 Units total) into the skin at bedtime. Patient not taking: Reported on Simpson 05/15/16   Kathy Feil, MD  liver oil-zinc oxide (DESITIN) 40 % ointment Apply 1 application topically as needed for irritation. Patient not taking: Reported on Simpson    [provider]  oxyCODONE-acetaminophen (PERCOCET/ROXICET) 5-325 MG tablet Take 1 tablet by mouth every 4 (four) hours as needed for severe pain. Patient not taking: Reported on Simpson 05/15/16   Kathy Feil, MD  polyethylene glycol (MIRALAX / GLYCOLAX) packet Take 17 g by mouth daily as needed for mild constipation. Patient not taking: Reported on Simpson    [provider]  spironolactone (ALDACTONE) 25 MG tablet Take 1 tablet (25 mg total) by mouth daily. Patient not taking: Reported on Simpson 09/24/14   Orson Eva, MD    Physical Exam:  Constitutional: Morbidly obese female currently in no acute distress Vitals:   06/09/20 0747 06/09/20 0938 06/09/20 1002 06/09/20 1015  BP:   110/63   Pulse: (!) 108 (!) 106 (!) 105 (!) 104  Resp: (!) 27 (!) 26 (!) 29 (!) 25  Temp:      TempSrc:      SpO2: 98% 99% 99% 98%   Eyes: PERRL, lids and conjunctivae normal ENMT: Mucous membranes are moist. Posterior pharynx clear of  any exudate or lesions.  Neck: normal, supple, no masses, no thyromegaly Respiratory: Decreased overall aeration without wheezes or rhonchi appreciated.  Fine crackles heard in lung fields. Cardiovascular: Irregularly irregular no murmurs / rubs / gallops.  Lower extremity swelling appreciated  Abdomen: Protuberant abdomen, no masses palpated.Bowel sounds positive.  Musculoskeletal: no clubbing / cyanosis. No joint deformity upper and lower extremities. Good ROM, no contractures. Normal muscle tone.  Skin: Fungal appearing rash on the right Neurologic: CN 2-12 grossly intact. Sensation intact, DTR normal. Strength 5/5 in all 4.  Psychiatric: Normal judgment and insight. Alert and oriented x 3. Normal mood.     Labs on Admission: I have personally reviewed following labs and imaging studies  CBC: Recent Labs  Lab 06/09/20 0602  WBC 4.1  HGB 13.8  HCT 43.1  MCV 90.9  PLT 962*   Basic Metabolic Panel: Recent Labs  Lab 06/09/20 0602 06/09/20 0754  NA 138  --   K 2.9*  --   CL 97*  --   CO2 29  --   GLUCOSE 147*  --   BUN 9  --   CREATININE 0.86  --   CALCIUM 8.3*  --   MG  --  1.8   GFR: CrCl cannot be calculated (Unknown ideal weight.). Liver Function Tests: Recent Labs  Lab 06/09/20 0754  AST 37  ALT 29  ALKPHOS 44  BILITOT 0.6  PROT 6.3*  ALBUMIN 2.9*   No results for input(s): LIPASE, AMYLASE in the last 168 hours. No results for input(s): AMMONIA in the last 168 hours. Coagulation Profile: Recent Labs  Lab 06/09/20 0754  INR 1.1   Cardiac Enzymes: No results for input(s): CKTOTAL, CKMB, CKMBINDEX, TROPONINI in the last 168 hours. BNP (last 3 results) No results for input(s): PROBNP in the last 8760 hours. HbA1C: No results for input(s): HGBA1C in the last 72 hours. CBG: No results for input(s): GLUCAP in the last 168 hours. Lipid Profile: No results for input(s): CHOL, HDL, LDLCALC, TRIG, CHOLHDL, LDLDIRECT in the last 72 hours. Thyroid Function  Tests: No results for input(s): TSH, T4TOTAL, FREET4, T3FREE, THYROIDAB in the last 72 hours. Anemia Panel: No results for input(s): VITAMINB12, FOLATE, FERRITIN, TIBC, IRON, RETICCTPCT in the last 72 hours. Urine analysis:    Component Value Date/Time   COLORURINE YELLOW 05/09/2016 [place] 05/09/2016 0437   APPEARANCEUR [place] 10/29/2014 0123   LABSPEC 1.023 05/09/2016 8366  LABSPEC 1.002 10/29/2014 0123   PHURINE 6.5 05/09/2016 0437   GLUCOSEU >1000 (A) 05/09/2016 0437   GLUCOSEU >=500 10/29/2014 0123   HGBUR NEGATIVE 05/09/2016 0437   BILIRUBINUR NEGATIVE 05/09/2016 0437   BILIRUBINUR Negative 10/29/2014 0123   KETONESUR 15 (A) 05/09/2016 0437   PROTEINUR NEGATIVE 05/09/2016 0437   UROBILINOGEN 1.0 09/13/2014 1649   NITRITE NEGATIVE 05/09/2016 0437   LEUKOCYTESUR NEGATIVE 05/09/2016 0437   LEUKOCYTESUR 2+ 10/29/2014 0123   Sepsis Labs: Recent Results (from the past 240 hour(s))  SARS Coronavirus 2 by RT PCR (hospital order, performed in [facility] hospital lab) Nasopharyngeal Nasopharyngeal Swab     Status: Abnormal   Collection Time: 06/09/20  8:30 AM   Specimen: Nasopharyngeal Swab  Result Value Ref Range Status   SARS Coronavirus 2 POSITIVE (A) NEGATIVE Final    Comment: RESULT CALLED TO, READ BACK BY AND VERIFIED WITH: RN JESSICA EASLEY 0945 06/09/2020 KB (NOTE) SARS-CoV-2 target nucleic acids are DETECTED  SARS-CoV-2 RNA is generally detectable in upper respiratory specimens  during the acute phase of infection.  Positive results are indicative  of the presence of the identified virus, but do not rule out bacterial infection or co-infection with other pathogens not detected by the test.  Clinical correlation with patient history and  other diagnostic information is necessary to determine patient infection status.  The expected result is negative.  Fact Sheet for Patients:   StrictlyIdeas.no   Fact Sheet for Healthcare  Providers:   BankingDealers.co.za    This test is not yet approved or cleared by the [place] FDA and  has been authorized for detection and/or diagnosis of SARS-CoV-2 by FDA under an Emergency Use Authorization (EUA).  This EUA will remain in effect (meaning this  test can be used) for the duration of  the COVID-19 declaration under Section 564(b)(1) of the Act, 21 U.S.C. section 360-bbb-3(b)(1), unless the authorization is terminated or revoked sooner.  Performed at [facility], [place] [address], [place], [place] [address]      Radiological Exams on Admission: DG Chest 2 View  Result Date: Simpson CLINICAL DATA:  Shortness of breath for 2 hours EXAM: CHEST - 2 VIEW COMPARISON:  05/09/2016 FINDINGS: Cardiomegaly with the generalized interstitial prominence above prior baseline. No air bronchogram, effusion, or pneumothorax IMPRESSION: Cardiomegaly with probable mild edema. Electronically Signed   By: Monte Fantasia M.D.   On: 06/09/2020 06:43   CT Angio Chest PE W and/or Wo Contrast  Result Date: Simpson CLINICAL DATA:  Shortness of breath EXAM: CT ANGIOGRAPHY CHEST WITH CONTRAST TECHNIQUE: Multidetector CT imaging of the chest was performed using the standard protocol during bolus administration of intravenous contrast. Multiplanar CT image reconstructions and MIPs were obtained to evaluate the vascular anatomy. CONTRAST:  93mL OMNIPAQUE IOHEXOL 350 MG/ML SOLN COMPARISON:  Chest x-ray today FINDINGS: Cardiovascular: Cardiomegaly. Small pericardial effusion. Coronary artery calcifications most pronounced in the left main, left anterior descending and left circumflex coronary arteries. Aortic atherosclerosis. No aneurysm. No evidence of pulmonary embolus. Mediastinum/Nodes: No mediastinal, hilar, or axillary adenopathy. Trachea and esophagus are unremarkable. Thyroid unremarkable. Lungs/Pleura: Mild vascular congestion. Scattered mild hazy opacities in the  perihilar and lower lobe regions may reflect early interstitial edema. 9 mm nodule along the right minor fissure. Nodular airspace opacity also seen in the right middle lobe adjacent to the major fissure measuring up to 17 mm, favor inflammatory/infectious. Upper Abdomen: Imaging into the upper abdomen shows no acute findings. Musculoskeletal: Chest wall soft tissues  are unremarkable. No acute bony abnormality. Review of the MIP images confirms the above findings. IMPRESSION: Cardiomegaly, vascular congestion mild perihilar hazy opacities which could reflect early interstitial edema. No evidence of pulmonary embolus. Small pericardial effusion. 9 mm nodule along the right minor fissure. 17 mm nodular airspace opacity in the right middle lobe. Favor inflammatory/infectious. These could be followed with repeat chest CT in 6 months to ensure resolution/stability. Electronically Signed   By: Rolm Baptise M.D.   On: 06/09/2020 09:55    EKG: Independently reviewed.  Atrial fibrillation with RVR 126 bpm with QTc 515  Assessment/Plan Acute respiratory failure with hypoxia secondary to COVID-19 infection: Patient presents with onset of shortness of breath.  CT scan of the chest concerning for interstitial opacities. COVID-19 screening was noted positive.   -Admit to a telemetry bed -COVID-19 order set utilized -Continuous pulse oximetry with nasal cannula oxygen to maintain O2 saturation greater than 90% -Albuterol inhaler as needed -Remdesivir per pharmacy -Decadron 6 mg Daily -Vitamin C and zinc -Follow-up rest of pending inflammatory markers and -Reevaluate for need of continuation of antibiotics  Sepsis: Acute. Patient was initially found to be febrile up to 102.8 F with tachycardia and tachypnea.  Initial lactic acid elevated up at 3.9.  Suspect secondary to Covid infection. Urinalysis did not show clear signs of infection. Patient had been given empiric antibiotics of vancomycin, cefepime, and  metronidazole. -Follow-up blood cultures -Trend lactic acid level -De-escalate antibiotics infection seen likely  Diastolic congestive heart failure: Acute on chronic.  Last EF noted to be 50 to 55% with grade 1 diastolic dysfunction and 12/252. BNP was elevated to 120.4 and imaging studies concerning for edema.  Patient had been given 20 mg of Lasix IV in the emergency department. -Strict intake and output  -Daily weights -Follow-up echocardiogram -Lasix 40 mg IV Bid -Adjust diuresis as needed  Atrial fibrillation with RVR: acute. Patient found in new onset atrial fibrillation with heart rates 120s. CHA2DS2-VASc score = at least 4. -Check echocardiogram -Goal potassium 4 and magnesium 2 -Diltiazem gtt -Lovenox pharmacy  Elevated troponin: Acute.  Patient denies any orders of chest pain.  Troponins 35->94.  Suspect possibly secondary to demand. -Follow-up telemetry  Hypokalemia: Acute.  Initial potassium noted to be 2.9.  Patient had been given 20 mEq of potassium chloride IV. -Give potassium chloride 60 mEq p.o. -Continue to monitor and replace as needed  Prolonged QT interval: Acute.  On admission QT interval noted to be around 515 -Avoid QT prolonging medications -Correct electrolyte abnormalities -Continue to monitor QT   Candidal skin infection: Acute.  Noted behind the right knee.. -Nystatin powder  Diabetes mellitus type 2: Home medications include Novolin 70/30 insulin 50 units twice daily and Trulicity 3 mg weekly. -Hypoglycemic protocol -Reduced Novolin 70/30 insulin to 50 units twice daily with meals. -CBGs before every meal with moderate SSI -Adjust regimen as needed  Right hip pain: Patient notes that she has recently been having worsening pain out of her right hip.  Denies any reports of fall/injury. -Follow-up x-rays of right hip  RLS: Home medications include Requip 2 mg every morning and 1 mg nightly. -Continue Requip  Adrenal insufficiency -Continue  hydrocortisone  Hypothyroidism    -Check TSH -Adjust levothyroxine as needed  DVT prophylaxis: Lovenox Code Status: Full Family Communication: Daughter updated over the phone Disposition Plan: To be determined Consults called: none Admission status: Inpatient  Norval Morton MD [facility] Pager [phone]   If 7PM-7AM, please contact night-coverage  [email or web address] Password [facility]  Simpson, 10:35 AM

## 2020-06-09 NOTE — Progress Notes (Signed)
Paged Kathy Simpson, [place] about initiating cardizem gtt dt patient being in NSR in the 90's. Awaiting response.

## 2020-06-09 NOTE — Progress Notes (Signed)
Echocardiogram 2D Echocardiogram has been performed.  Oneal Deputy Heyward Douthit 06/09/2020, 3:08 PM

## 2020-06-09 NOTE — Sepsis Progress Note (Signed)
Notified bedside nurse of need to administer antibiotics.  

## 2020-06-10 ENCOUNTER — Encounter (HOSPITAL_COMMUNITY): Payer: Self-pay | Admitting: Internal Medicine

## 2020-06-10 LAB — COMPREHENSIVE METABOLIC PANEL
ALT: 28 U/L (ref 0–44)
AST: 32 U/L (ref 15–41)
Albumin: 3.2 g/dL — ABNORMAL LOW (ref 3.5–5.0)
Alkaline Phosphatase: 47 U/L (ref 38–126)
Anion gap: 10 (ref 5–15)
BUN: 9 mg/dL (ref 8–23)
CO2: 32 mmol/L (ref 22–32)
Calcium: 8.3 mg/dL — ABNORMAL LOW (ref 8.9–10.3)
Chloride: 100 mmol/L (ref 98–111)
Creatinine, Ser: 0.93 mg/dL (ref 0.44–1.00)
GFR calc Af Amer: >60 mL/min (ref >60)
GFR calc non Af Amer: >60 mL/min (ref >60)
Glucose, Bld: 298 mg/dL — ABNORMAL HIGH (ref 70–99)
Potassium: 4.3 mmol/L (ref 3.5–5.1)
Sodium: 142 mmol/L (ref 135–145)
Total Bilirubin: 0.6 mg/dL (ref 0.3–1.2)
Total Protein: 6.4 g/dL — ABNORMAL LOW (ref 6.5–8.1)

## 2020-06-10 LAB — CBC WITH DIFFERENTIAL/PLATELET
Abs Immature Granulocytes: 0.02 10*3/uL (ref 0.00–0.07)
Basophils Absolute: 0 10*3/uL (ref 0.0–0.1)
Basophils Relative: 0 %
Eosinophils Absolute: 0 10*3/uL (ref 0.0–0.5)
Eosinophils Relative: 0 %
HCT: 39.2 % (ref 36.0–46.0)
Hemoglobin: 12.3 g/dL (ref 12.0–15.0)
Immature Granulocytes: 1 %
Lymphocytes Relative: 20 %
Lymphs Abs: 0.8 10*3/uL (ref 0.7–4.0)
MCH: 28.9 pg (ref 26.0–34.0)
MCHC: 31.4 g/dL (ref 30.0–36.0)
MCV: 92.2 fL (ref 80.0–100.0)
Monocytes Absolute: 0.3 10*3/uL (ref 0.1–1.0)
Monocytes Relative: 8 %
Neutro Abs: 2.9 10*3/uL (ref 1.7–7.7)
Neutrophils Relative %: 71 %
Platelets: 115 10*3/uL — ABNORMAL LOW (ref 150–400)
RBC: 4.25 MIL/uL (ref 3.87–5.11)
RDW: 14 % (ref 11.5–15.5)
WBC: 4.1 10*3/uL (ref 4.0–10.5)
nRBC: 0 % (ref 0.0–0.2)

## 2020-06-10 LAB — MRSA PCR SCREENING: MRSA by PCR: NEGATIVE

## 2020-06-10 LAB — GLUCOSE, CAPILLARY
Glucose-Capillary: 252 mg/dL — ABNORMAL HIGH (ref 70–99)
Glucose-Capillary: 295 mg/dL — ABNORMAL HIGH (ref 70–99)
Glucose-Capillary: 381 mg/dL — ABNORMAL HIGH (ref 70–99)
Glucose-Capillary: 335 mg/dL — ABNORMAL HIGH (ref 70–99)

## 2020-06-10 LAB — MAGNESIUM: Magnesium: 2.2 mg/dL (ref 1.7–2.4)

## 2020-06-10 LAB — C-REACTIVE PROTEIN: CRP: 11.6 mg/dL — ABNORMAL HIGH (ref <1.0)

## 2020-06-10 LAB — PHOSPHORUS: Phosphorus: 3.7 mg/dL (ref 2.5–4.6)

## 2020-06-10 LAB — FERRITIN: Ferritin: 361 ng/mL — ABNORMAL HIGH (ref 11–307)

## 2020-06-10 LAB — D-DIMER, QUANTITATIVE: D-Dimer, Quant: 0.89 ug/mL-FEU — ABNORMAL HIGH (ref 0.00–0.50)

## 2020-06-10 MED ORDER — FUROSEMIDE 40 MG PO TABS
40.0000 mg | ORAL_TABLET | Freq: Every day | ORAL | Status: DC
  Administered 2020-06-11 – 2020-06-13 (×3): 40 mg via ORAL
  Filled 2020-06-10 (×3): qty 1

## 2020-06-10 MED ORDER — ESCITALOPRAM OXALATE 20 MG PO TABS
20.0000 mg | ORAL_TABLET | Freq: Once | ORAL | Status: AC
  Administered 2020-06-10: 20 mg via ORAL
  Filled 2020-06-10: qty 1

## 2020-06-10 MED ORDER — ESCITALOPRAM OXALATE 20 MG PO TABS
20.0000 mg | ORAL_TABLET | Freq: Every day | ORAL | Status: DC
  Administered 2020-06-11 – 2020-06-12 (×2): 20 mg via ORAL
  Filled 2020-06-10 (×2): qty 1

## 2020-06-10 MED ORDER — INSULIN ASPART 100 UNIT/ML ~~LOC~~ SOLN
4.0000 [IU] | Freq: Once | SUBCUTANEOUS | Status: AC
  Administered 2020-06-10: 4 [IU] via SUBCUTANEOUS

## 2020-06-10 NOTE — Progress Notes (Addendum)
Progress Note    Kathy Simpson  ZOX:[id number] DOB: [birth year]  DOA: 06/09/2020 PCP: Lavone Orn, MD      Brief Narrative:    Medical records reviewed and are as summarized below:  Kathy Simpson is a 69 y.o. female with medical history significant for hypertension, chronic diastolic CHF, hypothyroidism, type 2 diabetes mellitus, morbid obesity, presented to the hospital because of increasing shortness of breath.  She also complained of nausea, vomiting, headache and right hip pain.  She was found to have acute hypoxemic respiratory failure, acute exacerbation of chronic diastolic CHF and sepsis secondary to COVID-19 pneumonia.      Assessment/Plan:   Principal Problem:   Acute respiratory failure with hypoxia (HCC) Active Problems:   Candidal skin infection   Atrial fibrillation with RVR (HCC)   Acute on chronic diastolic CHF (congestive heart failure) (HCC)   Hypokalemia   Diabetes mellitus type 2 in obese (HCC)   Right hip pain   COVID-19 virus infection   Sepsis secondary to COVID-19 pneumonia: Continue IV remdesivir and IV steroids.  Continue IV antibiotics for possible underlying bacterial pneumonia.  Acute exacerbation of chronic diastolic CHF: Change IV Lasix to oral Lasix.  Atrial fibrillation with RVR: Converted to normal sinus rhythm.  Change full dose Lovenox injections to Eliquis.  Discussed management of atrial fibrillation including stroke prophylaxis with long-term anticoagulation.  Patient is agreeable to proceed with long-term anticoagulation with Eliquis.  Benefits, risks and alternatives of anticoagulation were discussed.  Hypokalemia: Improved.  Adrenal insufficiency: Hold hydrocortisone while the patient is on IV dexamethasone.  Insulin-dependent diabetes mellitus: Continue NovoLog mix and monitor glucose levels.  Restless leg syndrome: Continue Requip.  Right hip pain: No acute fracture or dislocation noted on hip x-ray.  Probably due  to arthritis.  Analgesics as needed for pain.  Body mass index is 59.96 kg/m.  (Morbid obesity): This complicates care and overall prognosis.  Diet Order            Diet heart healthy/carb modified Room service appropriate? Yes; Fluid consistency: Thin; Fluid restriction: 2000 mL Fluid  Diet effective now                       Medications:   . albuterol  2 puff Inhalation Q6H  . vitamin C  500 mg Oral Daily  . dexamethasone  6 mg Oral Daily  . enoxaparin (LOVENOX) injection  1 mg/kg Subcutaneous Q12H  . escitalopram  20 mg Oral Daily  . [START ON 06/11/2020] furosemide  40 mg Oral Daily  . gabapentin  600 mg Oral BID  . insulin aspart  0-15 Units Subcutaneous TID WC  . insulin aspart protamine- aspart  50 Units Subcutaneous BID WC  . levothyroxine  175 mcg Oral QAC breakfast  . nystatin   Topical BID  . rOPINIRole  2 mg Oral q AM   And  . rOPINIRole  1 mg Oral QHS  . senna  2 tablet Oral QHS  . sodium chloride flush  3 mL Intravenous Q12H  . zinc sulfate  220 mg Oral Daily   Continuous Infusions: . diltiazem (CARDIZEM) infusion    . remdesivir 100 mg in NS 100 mL       Anti-infectives (From admission, onward)   Start     Dose/Rate Route Frequency Ordered Stop   06/10/20 1000  remdesivir 100 mg in sodium chloride 0.9 % 100 mL IVPB     Discontinue    "  Followed by" Linked Group Details   100 mg 200 mL/hr over 30 Minutes Intravenous Daily 06/09/20 1043 06/14/20 0959   06/09/20 2000  vancomycin (VANCOREADY) IVPB 1250 mg/250 mL  Status:  Discontinued        1,250 mg 166.7 mL/hr over 90 Minutes Intravenous Every 12 hours 06/09/20 0754 06/09/20 1825   06/09/20 1400  ceFEPIme (MAXIPIME) 2 g in sodium chloride 0.9 % 100 mL IVPB  Status:  Discontinued        2 g 200 mL/hr over 30 Minutes Intravenous Every 8 hours 06/09/20 0754 06/09/20 1825   06/09/20 1100  remdesivir 200 mg in sodium chloride 0.9% 250 mL IVPB       "Followed by" Linked Group Details   200 mg 580  mL/hr over 30 Minutes Intravenous Once 06/09/20 1043 06/09/20 1436   06/09/20 0800  ceFEPIme (MAXIPIME) 2 g in sodium chloride 0.9 % 100 mL IVPB        2 g 200 mL/hr over 30 Minutes Intravenous  Once 06/09/20 0747 06/09/20 0933   06/09/20 0800  metroNIDAZOLE (FLAGYL) IVPB 500 mg        500 mg 100 mL/hr over 60 Minutes Intravenous  Once 06/09/20 0747 06/09/20 1007   06/09/20 0800  vancomycin (VANCOCIN) IVPB 1000 mg/200 mL premix  Status:  Discontinued        1,000 mg 200 mL/hr over 60 Minutes Intravenous  Once 06/09/20 0747 06/09/20 0752   06/09/20 0800  vancomycin (VANCOREADY) IVPB 2000 mg/400 mL        2,000 mg 200 mL/hr over 120 Minutes Intravenous  Once 06/09/20 0752 06/09/20 1338             Family Communication/Anticipated D/C date and plan/Code Status   DVT prophylaxis:      Code Status: Full Code  Family Communication: Plan discussed with patient Disposition Plan:    Status is: Inpatient  Remains inpatient appropriate because:IV treatments appropriate due to intensity of illness or inability to take PO and Inpatient level of care appropriate due to severity of illness   Dispo: The patient is from: Home              Anticipated d/c is to: Home              Anticipated d/c date is: 3 days              Patient currently is not medically stable to d/c.           Subjective:   She feels a little better compared to yesterday.  No shortness of breath or chest pain.  Objective:    Vitals:   06/10/20 0019 06/10/20 0322 06/10/20 0500 06/10/20 0730  BP: 135/67 132/69  123/66  Pulse: 87 84  85  Resp: 19 15  18   Temp: 98 F (36.7 C) 97.9 F (36.6 C)  97.8 F (36.6 C)  TempSrc: Oral Oral  Oral  SpO2: 94% 94%  96%  Weight:   (!) 148.7 kg   Height:       No data found.   Intake/Output Summary (Last 24 hours) at 06/10/2020 1126 Last data filed at 06/10/2020 0857 Gross per 24 hour  Intake 2709 ml  Output 2200 ml  Net 509 ml   Filed Weights    06/09/20 1744 06/10/20 0500  Weight: (!) 151.1 kg (!) 148.7 kg    Exam:  GEN: NAD SKIN: Warm and dry EYES: EOMI ENT: MMM CV: RRR PULM: CTA  B ABD: soft, obese, NT, +BS CNS: AAO x 3, non focal EXT: No edema or tenderness   Data Reviewed:   I have personally reviewed following labs and imaging studies:  Labs: Labs show the following:   Basic Metabolic Panel: Recent Labs  Lab 06/09/20 0602 06/09/20 0754 06/10/20 0332  NA 138  --  142  K 2.9*  --  4.3  CL 97*  --  100  CO2 29  --  32  GLUCOSE 147*  --  298*  BUN 9  --  9  CREATININE 0.86  --  0.93  CALCIUM 8.3*  --  8.3*  MG  --  1.8 2.2  PHOS  --   --  3.7   GFR Estimated Creatinine Clearance: 82.9 mL/min (by C-G formula based on SCr of 0.93 mg/dL). Liver Function Tests: Recent Labs  Lab 06/09/20 0754 06/10/20 0332  AST 37 32  ALT 29 28  ALKPHOS 44 47  BILITOT 0.6 0.6  PROT 6.3* 6.4*  ALBUMIN 2.9* 3.2*   No results for input(s): LIPASE, AMYLASE in the last 168 hours. No results for input(s): AMMONIA in the last 168 hours. Coagulation profile Recent Labs  Lab 06/09/20 0754  INR 1.1    CBC: Recent Labs  Lab 06/09/20 0602 06/10/20 0332  WBC 4.1 4.1  NEUTROABS  --  2.9  HGB 13.8 12.3  HCT 43.1 39.2  MCV 90.9 92.2  PLT 123* 115*   Cardiac Enzymes: No results for input(s): CKTOTAL, CKMB, CKMBINDEX, TROPONINI in the last 168 hours. BNP (last 3 results) No results for input(s): PROBNP in the last 8760 hours. CBG: Recent Labs  Lab 06/09/20 1342 06/09/20 1749 06/09/20 2102 06/10/20 0732  GLUCAP 124* 225* 340* 252*   D-Dimer: Recent Labs    06/09/20 0754 06/10/20 0332  DDIMER 0.89* 0.89*   Hgb A1c: No results for input(s): HGBA1C in the last 72 hours. Lipid Profile: No results for input(s): CHOL, HDL, LDLCALC, TRIG, CHOLHDL, LDLDIRECT in the last 72 hours. Thyroid function studies: Recent Labs    06/09/20 1224  TSH <0.010*   Anemia work up: Recent Labs    06/09/20 1224  06/10/20 0332  FERRITIN 269 361*   Sepsis Labs: Recent Labs  Lab 06/09/20 0602 06/09/20 0605 06/09/20 0754 06/09/20 1230 06/10/20 0332  PROCALCITON  --   --  <0.10  --   --   WBC 4.1  --   --   --  4.1  LATICACIDVEN  --  1.7 3.1* 1.6  --     Microbiology Recent Results (from the past 240 hour(s))  SARS Coronavirus 2 by RT PCR (hospital order, performed in [place] hospital lab) Nasopharyngeal Nasopharyngeal Swab     Status: Abnormal   Collection Time: 06/09/20  8:30 AM   Specimen: Nasopharyngeal Swab  Result Value Ref Range Status   SARS Coronavirus 2 POSITIVE (A) NEGATIVE Final    Comment: RESULT CALLED TO, READ BACK BY AND VERIFIED WITH: RN JESSICA EASLEY 0945 06/09/2020 KB (NOTE) SARS-CoV-2 target nucleic acids are DETECTED  SARS-CoV-2 RNA is generally detectable in upper respiratory specimens  during the acute phase of infection.  Positive results are indicative  of the presence of the identified virus, but do not rule out bacterial infection or co-infection with other pathogens not detected by the test.  Clinical correlation with patient history and  other diagnostic information is necessary to determine patient infection status.  The expected result is negative.  Fact Sheet for Patients:  StrictlyIdeas.no   Fact Sheet for Healthcare Providers:   BankingDealers.co.za    This test is not yet approved or cleared by the [place] FDA and  has been authorized for detection and/or diagnosis of SARS-CoV-2 by FDA under an Emergency Use Authorization (EUA).  This EUA will remain in effect (meaning this  test can be used) for the duration of  the COVID-19 declaration under Section 564(b)(1) of the Act, 21 U.S.C. section 360-bbb-3(b)(1), unless the authorization is terminated or revoked sooner.  Performed at [facility], [place] [address], [place], [place] [address]     Procedures and diagnostic  studies:  DG Chest 2 View  Result Date: 06/09/2020 CLINICAL DATA:  Shortness of breath for 2 hours EXAM: CHEST - 2 VIEW COMPARISON:  05/09/2016 FINDINGS: Cardiomegaly with the generalized interstitial prominence above prior baseline. No air bronchogram, effusion, or pneumothorax IMPRESSION: Cardiomegaly with probable mild edema. Electronically Signed   By: Monte Fantasia M.D.   On: 06/09/2020 06:43   CT Angio Chest PE W and/or Wo Contrast  Result Date: 06/09/2020 CLINICAL DATA:  Shortness of breath EXAM: CT ANGIOGRAPHY CHEST WITH CONTRAST TECHNIQUE: Multidetector CT imaging of the chest was performed using the standard protocol during bolus administration of intravenous contrast. Multiplanar CT image reconstructions and MIPs were obtained to evaluate the vascular anatomy. CONTRAST:  37mL OMNIPAQUE IOHEXOL 350 MG/ML SOLN COMPARISON:  Chest x-ray today FINDINGS: Cardiovascular: Cardiomegaly. Small pericardial effusion. Coronary artery calcifications most pronounced in the left main, left anterior descending and left circumflex coronary arteries. Aortic atherosclerosis. No aneurysm. No evidence of pulmonary embolus. Mediastinum/Nodes: No mediastinal, hilar, or axillary adenopathy. Trachea and esophagus are unremarkable. Thyroid unremarkable. Lungs/Pleura: Mild vascular congestion. Scattered mild hazy opacities in the perihilar and lower lobe regions may reflect early interstitial edema. 9 mm nodule along the right minor fissure. Nodular airspace opacity also seen in the right middle lobe adjacent to the major fissure measuring up to 17 mm, favor inflammatory/infectious. Upper Abdomen: Imaging into the upper abdomen shows no acute findings. Musculoskeletal: Chest wall soft tissues are unremarkable. No acute bony abnormality. Review of the MIP images confirms the above findings. IMPRESSION: Cardiomegaly, vascular congestion mild perihilar hazy opacities which could reflect early interstitial edema. No evidence  of pulmonary embolus. Small pericardial effusion. 9 mm nodule along the right minor fissure. 17 mm nodular airspace opacity in the right middle lobe. Favor inflammatory/infectious. These could be followed with repeat chest CT in 6 months to ensure resolution/stability. Electronically Signed   By: Rolm Baptise M.D.   On: 06/09/2020 09:55   DG HIP UNILAT WITH PELVIS 2-3 VIEWS RIGHT  Result Date: 06/09/2020 CLINICAL DATA:  69 year old female with right hip pain. EXAM: DG HIP (WITH OR WITHOUT PELVIS) 2-3V RIGHT COMPARISON:  CT abdomen pelvis dated 04/28/2016. FINDINGS: Evaluation is limited due to osteopenia and soft tissue attenuation. There is no acute fracture or dislocation. The bones are osteopenic. Partially visualized lower lumbar fusion hardware. Soft tissues are unremarkable. IMPRESSION: No acute fracture or dislocation. Electronically Signed   By: Anner Crete M.D.   On: 06/09/2020 21:41   ECHOCARDIOGRAM LIMITED  Result Date: 06/09/2020    ECHOCARDIOGRAM LIMITED REPORT   Patient Name:   Kathy Simpson Date of Exam: 06/09/2020 Medical Rec #:  [id number]       Height:       63.0 in Accession #:    [id number]      Weight:       309.0 lb Date of  Birth:  February 13, 1951       BSA:          2.326 m Patient Age:    75 years        BP:           83/25 mmHg Patient Gender: F               HR:           80 bpm. Exam Location:  Inpatient Procedure: Limited Echo, Color Doppler, Cardiac Doppler and Intracardiac            Opacification Agent Indications:    I48.91* Unspecified atrial fibrillation  History:        Patient has prior history of Echocardiogram examinations, most                 recent 05/09/2016. CHF; Risk Factors:Hypertension, Diabetes and                 Sleep Apnea. COVID+ 06/09/20.  Sonographer:    Raquel Sarna Senior RDCS Referring Phys: [id number] RONDELL A SMITH  Sonographer Comments: Technically difficult due to patient body habitus. IMPRESSIONS  1. Left ventricular ejection fraction, by estimation, is 55 to  60%. The left ventricle has normal function. The left ventricle demonstrates regional wall motion abnormalities (see scoring diagram/findings for description).  2. Moderate pericardial effusion. The pericardial effusion is circumferential. There is no evidence of cardiac tamponade.  3. The aortic valve is tricuspid.  4. There is normal pulmonary artery systolic pressure.  5. The inferior vena cava is dilated in size with <50% respiratory variability, suggesting right atrial pressure of 15 mmHg. FINDINGS  Left Ventricle: Apical akinesis. Left ventricular ejection fraction, by estimation, is 55 to 60%. The left ventricle has normal function. The left ventricle demonstrates regional wall motion abnormalities. Definity contrast agent was given IV to delineate the left ventricular endocardial borders. Right Ventricle: There is normal pulmonary artery systolic pressure. The tricuspid regurgitant velocity is 2.02 m/s, and with an assumed right atrial pressure of 15 mmHg, the estimated right ventricular systolic pressure is 19.5 mmHg. Pericardium: Pericardial effusion measuring 1.87 cm. A moderately sized pericardial effusion is present. The pericardial effusion is circumferential. There is excessive respiratory variation in the tricuspid valve spectral Doppler velocities. There is no  evidence of cardiac tamponade. Tricuspid Valve: Tricuspid valve regurgitation is trivial. Aortic Valve: The aortic valve is tricuspid. Venous: The inferior vena cava is dilated in size with less than 50% respiratory variability, suggesting right atrial pressure of 15 mmHg.  TRICUSPID VALVE TR Peak grad:   16.3 mmHg TR Vmax:        202.00 cm/s Skeet Latch MD Electronically signed by Skeet Latch MD Signature Date/Time: 06/09/2020/4:27:03 PM    Final                LOS: 1 day   Kiele Heavrin  [facility]     06/10/2020, 11:26 AM

## 2020-06-10 NOTE — Evaluation (Signed)
Physical Therapy Evaluation Patient Details Name: Kathy Simpson MRN: [id number] DOB: [birth year] Today's Date: 06/10/2020   History of Present Illness  69 y.o. female admitted on 06/09/20 for SOB, N/V, HA and R hip pain.  Pt dx with acute hypoxemic respiratory failure, acute exacerbation of CHF and sepsis due to COVID 19 PNA.  Pt with significant PMH of obesity, HTN, DM, pituitary surgery, knee surgery, back surgery.    Clinical Impression  Pt was able to walk around the room min guard assist and RW.  She was on 2 L O2 [place] throughout with VSS in the mid 90s.  Pt did not feel she was ready to wean O2, so I did not preform ambulatory sat test today.  She would benefit from [facility] therapy follow up at discharge.   PT to follow acutely for deficits listed below.      Follow Up Recommendations Home health PT    Equipment Recommendations  Other (comment) (possible home O2)    Recommendations for Other Services OT consult     Precautions / Restrictions Precautions Precautions: Fall      Mobility  Bed Mobility Overal bed mobility: Needs Assistance Bed Mobility: Supine to Sit     Supine to sit: Supervision     General bed mobility comments: Extra time and effort needed to get to EOB.  HOB elevated, but pt has a bed at home that elevates  Transfers Overall transfer level: Needs assistance Equipment used: Rolling walker (2 wheeled) Transfers: Sit to/from Stand Sit to Stand: Min guard         General transfer comment: Min guard assist for safety and to stabilize the RW during transition of hands from bed to walker.   Ambulation/Gait Ambulation/Gait assistance: Min guard Gait Distance (Feet): 20 Feet Assistive device: Rolling walker (2 wheeled) Gait Pattern/deviations: Step-through pattern;Shuffle;Trunk flexed Gait velocity: decreased Gait velocity interpretation: <1.8 ft/sec, indicate of risk for recurrent falls General Gait Details: Pt leaning forward on RW, slow, yet steady  gait.  O2 2L O2 [place] left on Pt throughout session with sats in the 90s.  She self reported it was taken off earlier, she dropped to 86% and she started getting lightheaded, so I did not test ambulatory sats today.   Stairs            Wheelchair Mobility    Modified Rankin (Stroke Patients Only)       Balance Overall balance assessment: Needs assistance Sitting-balance support: Feet supported;Bilateral upper extremity supported Sitting balance-Leahy Scale: Fair     Standing balance support: Bilateral upper extremity supported Standing balance-Leahy Scale: Poor Standing balance comment: reliant on RW in standing                             Pertinent Vitals/Pain Pain Assessment: Faces Faces Pain Scale: Hurts little more Pain Location: chronic hip and low back pain Pain Descriptors / Indicators: Constant Pain Intervention(s): Limited activity within patient's tolerance;Monitored during session;Repositioned    Home Living Family/patient expects to be discharged to:: Private residence Living Arrangements: Alone Available Help at Discharge: Family;Available PRN/intermittently (depends, he dtr has COVID and is sick as well) Type of Home: House Home Access: Ramped entrance     Home Layout: One level Home Equipment: Walker - 2 wheels;Grab bars - toilet;Grab bars - tub/shower;Wheelchair - power      Prior Function Level of Independence: Independent with assistive device(s)  Comments: walks with RW at home, does not use home O2     Hand Dominance   Dominant Hand: Right    Extremity/Trunk Assessment   Upper Extremity Assessment Upper Extremity Assessment: Defer to OT evaluation    Lower Extremity Assessment Lower Extremity Assessment: Generalized weakness    Cervical / Trunk Assessment Cervical / Trunk Assessment: Other exceptions Cervical / Trunk Exceptions: flexed when walking  Communication   Communication: No difficulties  Cognition  Arousal/Alertness: Awake/alert Behavior During Therapy: WFL for tasks assessed/performed Overall Cognitive Status: Within Functional Limits for tasks assessed                                        General Comments General comments (skin integrity, edema, etc.): Flutter valve and IS ordered, but none in room,  none in storage, so asked Teacher, music to order some.     Exercises General Exercises - Lower Extremity Ankle Circles/Pumps: AROM Quad Sets: AROM Gluteal Sets: AROM Hip ABduction/ADduction: AROM   Assessment/Plan    PT Assessment Patient needs continued PT services  PT Problem List Decreased strength;Decreased activity tolerance;Decreased balance;Decreased mobility;Decreased knowledge of precautions;Cardiopulmonary status limiting activity;Obesity       PT Treatment Interventions DME instruction;Gait training;Functional mobility training;Therapeutic activities;Therapeutic exercise;Balance training;Patient/family education    PT Goals (Current goals can be found in the Care Plan section)  Acute Rehab PT Goals Patient Stated Goal: to get better, get back home PT Goal Formulation: With patient Time For Goal Achievement: 06/24/20 Potential to Achieve Goals: Good    Frequency Min 3X/week   Barriers to discharge Decreased caregiver support possible decreased caregiver support, depends on how her daughter feels as she is sick with COVID as well    Co-evaluation               AM-PAC PT "6 Clicks" Mobility  Outcome Measure Help needed turning from your back to your side while in a flat bed without using bedrails?: A Little Help needed moving from lying on your back to sitting on the side of a flat bed without using bedrails?: A Little Help needed moving to and from a bed to a chair (including a wheelchair)?: A Little Help needed standing up from a chair using your arms (e.g., wheelchair or bedside chair)?: A Little Help needed to walk in hospital  room?: A Little Help needed climbing 3-5 steps with a railing? : A Lot 6 Click Score: 17    End of Session Equipment Utilized During Treatment: Oxygen (2 L O2 [place]) Activity Tolerance: Patient limited by fatigue Patient left: in chair;with call bell/phone within reach Nurse Communication: Mobility status PT Visit Diagnosis: Muscle weakness (generalized) (M62.81);Difficulty in walking, not elsewhere classified (R26.2)    Time: 1103-1594 PT Time Calculation (min) (ACUTE ONLY): 37 min   Charges:   PT Evaluation $PT Eval Moderate Complexity: 1 Mod PT Treatments $Gait Training: 8-22 mins        Verdene Lennert, PT, DPT  Acute Rehabilitation [phone] pager [phone]) [phone] office

## 2020-06-10 NOTE — Plan of Care (Signed)
  Problem: Fluid Volume: Goal: Ability to maintain a balanced intake and output will improve Outcome: Progressing   

## 2020-06-11 DIAGNOSIS — J1282 Pneumonia due to coronavirus disease 2019: Secondary | ICD-10-CM

## 2020-06-11 LAB — CBC WITH DIFFERENTIAL/PLATELET
Abs Immature Granulocytes: 0.02 10*3/uL (ref 0.00–0.07)
Basophils Absolute: 0 10*3/uL (ref 0.0–0.1)
Basophils Relative: 0 %
Eosinophils Absolute: 0 10*3/uL (ref 0.0–0.5)
Eosinophils Relative: 0 %
HCT: 41.4 % (ref 36.0–46.0)
Hemoglobin: 13.1 g/dL (ref 12.0–15.0)
Immature Granulocytes: 0 %
Lymphocytes Relative: 13 %
Lymphs Abs: 1.2 10*3/uL (ref 0.7–4.0)
MCH: 29.3 pg (ref 26.0–34.0)
MCHC: 31.6 g/dL (ref 30.0–36.0)
MCV: 92.6 fL (ref 80.0–100.0)
Monocytes Absolute: 0.5 10*3/uL (ref 0.1–1.0)
Monocytes Relative: 6 %
Neutro Abs: 7.2 10*3/uL (ref 1.7–7.7)
Neutrophils Relative %: 81 %
Platelets: 148 10*3/uL — ABNORMAL LOW (ref 150–400)
RBC: 4.47 MIL/uL (ref 3.87–5.11)
RDW: 13.6 % (ref 11.5–15.5)
WBC: 8.9 10*3/uL (ref 4.0–10.5)
nRBC: 0 % (ref 0.0–0.2)

## 2020-06-11 LAB — PHOSPHORUS: Phosphorus: 2.8 mg/dL (ref 2.5–4.6)

## 2020-06-11 LAB — COMPREHENSIVE METABOLIC PANEL
ALT: 25 U/L (ref 0–44)
AST: 36 U/L (ref 15–41)
Albumin: 3.2 g/dL — ABNORMAL LOW (ref 3.5–5.0)
Alkaline Phosphatase: 51 U/L (ref 38–126)
Anion gap: 11 (ref 5–15)
BUN: 15 mg/dL (ref 8–23)
CO2: 25 mmol/L (ref 22–32)
Calcium: 8.2 mg/dL — ABNORMAL LOW (ref 8.9–10.3)
Chloride: 101 mmol/L (ref 98–111)
Creatinine, Ser: 0.73 mg/dL (ref 0.44–1.00)
GFR calc Af Amer: >60 mL/min (ref >60)
GFR calc non Af Amer: >60 mL/min (ref >60)
Glucose, Bld: 278 mg/dL — ABNORMAL HIGH (ref 70–99)
Potassium: 4.4 mmol/L (ref 3.5–5.1)
Sodium: 137 mmol/L (ref 135–145)
Total Bilirubin: 1 mg/dL (ref 0.3–1.2)
Total Protein: 6.6 g/dL (ref 6.5–8.1)

## 2020-06-11 LAB — GLUCOSE, CAPILLARY
Glucose-Capillary: 238 mg/dL — ABNORMAL HIGH (ref 70–99)
Glucose-Capillary: 209 mg/dL — ABNORMAL HIGH (ref 70–99)
Glucose-Capillary: 310 mg/dL — ABNORMAL HIGH (ref 70–99)
Glucose-Capillary: 270 mg/dL — ABNORMAL HIGH (ref 70–99)

## 2020-06-11 LAB — HEMOGLOBIN A1C
Hgb A1c MFr Bld: 6.9 % — ABNORMAL HIGH (ref 4.8–5.6)
Mean Plasma Glucose: 151.33 mg/dL

## 2020-06-11 LAB — FERRITIN: Ferritin: 369 ng/mL — ABNORMAL HIGH (ref 11–307)

## 2020-06-11 LAB — MAGNESIUM: Magnesium: 2.1 mg/dL (ref 1.7–2.4)

## 2020-06-11 LAB — C-REACTIVE PROTEIN: CRP: 7 mg/dL — ABNORMAL HIGH (ref <1.0)

## 2020-06-11 LAB — D-DIMER, QUANTITATIVE: D-Dimer, Quant: 0.56 ug/mL-FEU — ABNORMAL HIGH (ref 0.00–0.50)

## 2020-06-11 MED ORDER — APIXABAN 5 MG PO TABS
5.0000 mg | ORAL_TABLET | Freq: Two times a day (BID) | ORAL | Status: DC
  Administered 2020-06-11 – 2020-06-13 (×4): 5 mg via ORAL
  Filled 2020-06-11 (×4): qty 1

## 2020-06-11 MED ORDER — INSULIN ASPART PROT & ASPART (70-30 MIX) 100 UNIT/ML ~~LOC~~ SUSP
55.0000 [IU] | Freq: Two times a day (BID) | SUBCUTANEOUS | Status: DC
  Administered 2020-06-11 – 2020-06-13 (×4): 55 [IU] via SUBCUTANEOUS
  Filled 2020-06-11: qty 10

## 2020-06-11 MED ORDER — DEXAMETHASONE 4 MG PO TABS
8.0000 mg | ORAL_TABLET | Freq: Every day | ORAL | Status: DC
  Filled 2020-06-11: qty 2

## 2020-06-11 NOTE — Progress Notes (Signed)
Inpatient Diabetes Program Recommendations  AACE/ADA: New Consensus Statement on Inpatient Glycemic Control (2015)  Target Ranges:  Prepandial:   less than 140 mg/dL      Peak postprandial:   less than 180 mg/dL (1-2 hours)      Critically ill patients:  140 - 180 mg/dL   Lab Results  Component Value Date   GLUCAP 209 (H) 06/11/2020   HGBA1C 10.9 (H) 05/15/2016    Review of Glycemic Control Results for MIKHIA, DUSEK (MRN [id number]) as of 06/11/2020 11:57  Ref. Range 06/10/2020 17:39 06/10/2020 21:26 06/11/2020 07:21 06/11/2020 11:46  Glucose-Capillary Latest Ref Range: 70 - 99 mg/dL 381 (H) 335 (H) 238 (H) 209 (H)   Diabetes history: TYpe 2 DM Outpatient Diabetes medications: Novolog 70/30 50 units BID Current orders for Inpatient glycemic control: Novolog 70/30 55 units BID, Novolog 0-15 units TID Decadron 8 mg QD  Inpatient Diabetes Program Recommendations:    Noted increase to 70/30; in agreement.  Of note, last A1C from 2017, consider repeating.  May also want to consider adding Tradjenta 5 mg QD.   Thanks, Bronson Curb, MSN, RNC-OB Diabetes Coordinator [phone] (8a-5p)

## 2020-06-11 NOTE — Progress Notes (Signed)
Occupational Therapy Evaluation Patient Details Name: Kathy Simpson MRN: [id number] DOB: [birth year] Today's Date: 06/11/2020    History of Present Illness 69 y.o. female admitted on 06/09/20 for SOB, N/V, HA and R hip pain.  Pt dx with acute hypoxemic respiratory failure, acute exacerbation of CHF and sepsis due to COVID 19 PNA.  Pt with significant PMH of obesity, HTN, DM, pituitary surgery, knee surgery, back surgery.     Clinical Impression   Patient lives alone in a single level house.  Is independent with devices for ADLs at prior level, and has family that often checks in on her and does home management.  Patient's primary limitation today is decreased activity tolerance.  She was able to walk to bathroom with min guard and RW and maintain SpO2 >92 on room air.  She did fatigue quickly, though, and complains of seeing "little black spots" when being off O2 for too long.  Practiced pursed lip breathing and left on .5L O2.  Able to complete UB ADLs with set up-min assist and LB ADLs with min-mod assist.  Will continue to follow with OT acutely to address the deficits listed below.      Follow Up Recommendations  Home health OT;Supervision - Intermittent    Equipment Recommendations  None recommended by OT    Recommendations for Other Services       Precautions / Restrictions Precautions Precautions: Fall Restrictions Weight Bearing Restrictions: No      Mobility Bed Mobility Overal bed mobility: Needs Assistance Bed Mobility: Sit to Supine       Sit to supine: Supervision      Transfers Overall transfer level: Needs assistance Equipment used: Rolling walker (2 wheeled) Transfers: Sit to/from Stand Sit to Stand: Min guard         General transfer comment: Min guard assist for safety and to stabilize the RW during transition of hands from bed to walker.     Balance Overall balance assessment: Needs assistance Sitting-balance support: Feet supported;Bilateral  upper extremity supported Sitting balance-Leahy Scale: Fair     Standing balance support: Bilateral upper extremity supported Standing balance-Leahy Scale: Poor Standing balance comment: reliant on RW in standing                           ADL either performed or assessed with clinical judgement   ADL Overall ADL's : Needs assistance/impaired Eating/Feeding: Independent;Sitting   Grooming: Min guard;Standing   Upper Body Bathing: Minimal assistance;Sitting   Lower Body Bathing: Moderate assistance;Sit to/from stand   Upper Body Dressing : Set up;Min guard;Sitting   Lower Body Dressing: Moderate assistance;Sit to/from stand   Toilet Transfer: Min guard;Ambulation;Regular Toilet;RW   Toileting- Clothing Manipulation and Hygiene: Minimal assistance;Sit to/from stand Toileting - Clothing Manipulation Details (indicate cue type and reason): has a bodet at home     Functional mobility during ADLs: Min guard;Rolling walker General ADL Comments: Moves slowly     Vision         Perception     Praxis      Pertinent Vitals/Pain Pain Assessment: No/denies pain     Hand Dominance Right   Extremity/Trunk Assessment Upper Extremity Assessment Upper Extremity Assessment: Generalized weakness   Lower Extremity Assessment Lower Extremity Assessment: Defer to PT evaluation       Communication Communication Communication: No difficulties   Cognition Arousal/Alertness: Awake/alert Behavior During Therapy: WFL for tasks assessed/performed Overall Cognitive Status: Within Functional Limits for tasks assessed  General Comments       Exercises     Shoulder Instructions      Home Living Family/patient expects to be discharged to:: Private residence Living Arrangements: Alone Available Help at Discharge: Family;Available PRN/intermittently Type of Home: House Home Access: Ramped entrance     Home Layout:  One level     Bathroom Shower/Tub: Other (comment)   Bathroom Toilet: Handicapped height Bathroom Accessibility: Yes   Home Equipment: Walker - 2 wheels;Grab bars - toilet;Grab bars - tub/shower;Wheelchair - power;Shower seat          Prior Functioning/Environment Level of Independence: Independent with assistive device(s);Needs assistance    ADL's / Homemaking Assistance Needed: Daughter helps with home management   Comments: walks with RW at home, does not use home O2        OT Problem List: Decreased strength;Decreased activity tolerance;Impaired balance (sitting and/or standing);Cardiopulmonary status limiting activity;Obesity      OT Treatment/Interventions: Self-care/ADL training;Therapeutic exercise;Energy conservation;Therapeutic activities;Patient/family education;Balance training    OT Goals(Current goals can be found in the care plan section) Acute Rehab OT Goals Patient Stated Goal: to get better, get back home OT Goal Formulation: With patient Time For Goal Achievement: 06/25/20 Potential to Achieve Goals: Good  OT Frequency: Min 2X/week   Barriers to D/C: Decreased caregiver support          Co-evaluation              AM-PAC OT "6 Clicks" Daily Activity     Outcome Measure Help from another person eating meals?: None Help from another person taking care of personal grooming?: A Little Help from another person toileting, which includes using toliet, bedpan, or urinal?: A Little Help from another person bathing (including washing, rinsing, drying)?: A Lot Help from another person to put on and taking off regular upper body clothing?: A Little Help from another person to put on and taking off regular lower body clothing?: A Lot 6 Click Score: 17   End of Session Equipment Utilized During Treatment: Rolling walker;Oxygen Nurse Communication: Mobility status  Activity Tolerance: Patient tolerated treatment well Patient left: in bed;with call  bell/phone within reach;with bed alarm set  OT Visit Diagnosis: Muscle weakness (generalized) (M62.81);Other (comment) (decreased activity tolerance)                Time: 3474-2595 OT Time Calculation (min): 39 min Charges:  OT General Charges $OT Visit: 1 Visit OT Evaluation $OT Eval Moderate Complexity: 1 Mod OT Treatments $Self Care/Home Management : 23-37 mins  August Luz, OTR/L   Phylliss Bob 06/11/2020, 10:20 AM

## 2020-06-11 NOTE — Plan of Care (Signed)
  Problem: Skin Integrity: Goal: Risk for impaired skin integrity will decrease Outcome: Progressing   

## 2020-06-11 NOTE — TOC Initial Note (Signed)
Transition of Care [facility]) - Initial/Assessment Note    Patient Details  Name: Kathy Simpson MRN: [id number] Date of Birth: 08-04-1951  Transition of [facility]) CM/SW Contact:    Marilu Favre, RN Phone Number: 06/11/2020, 12:51 PM  Clinical Narrative:                 Spoke to patient via phone. Confirmed face sheet information. Discussed PT recommendation for home health PT. PAtient voices understanding but does not feel that she needs home health services at this time. Patient feels she is baseline at this time.   Patient has walker, electric wheel chair, shower bench, rails, electric bed at home. She does live alone however she has support. Son lives 10 minutes away, daughter lives 5 minutes away, one daughter lives on patient's property in a camper , and she also has a neighbor who checks and helps her.   Discussed possible need for home oxygen .   Expected Discharge Plan: Home/Self Care     Patient Goals and CMS Choice Patient states their goals for this hospitalization and ongoing recovery are:: to return to home CMS Medicare.gov Compare Post Acute Care list provided to:: Patient Choice offered to / list presented to : Patient  Expected Discharge Plan and Services Expected Discharge Plan: Home/Self Care   Discharge Planning Services: CM Consult Post Acute Care Choice: [place] arrangements for the past 2 months: Single Family Home                 DME Arranged: N/A         HH Arranged: Refused HH          Prior Living Arrangements/Services Living arrangements for the past 2 months: Single Family Home Lives with:: Self Patient language and need for interpreter reviewed:: Yes        Need for Family Participation in Patient Care: Yes (Comment) Care giver support system in place?: Yes (comment) Current home services: DME Criminal Activity/Legal Involvement Pertinent to Current Situation/Hospitalization: No - Comment as needed  Activities of Daily  Living Home Assistive Devices/Equipment: None ADL Screening (condition at time of admission) Patient's cognitive ability adequate to safely complete daily activities?: Yes Is the patient deaf or have difficulty hearing?: No Does the patient have difficulty seeing, even when wearing glasses/contacts?: No Does the patient have difficulty concentrating, remembering, or making decisions?: No Patient able to express need for assistance with ADLs?: Yes Does the patient have difficulty dressing or bathing?: Yes Independently performs ADLs?: No Communication: Independent Dressing (OT): Needs assistance Is this a change from baseline?: Pre-admission baseline Grooming: Needs assistance Is this a change from baseline?: Pre-admission baseline Feeding: Independent Bathing: Needs assistance Is this a change from baseline?: Pre-admission baseline Toileting: Needs assistance Is this a change from baseline?: Change from baseline, expected to last <3 days In/Out Bed: Needs assistance Is this a change from baseline?: Change from baseline, expected to last <3 days Walks in Home: Independent with device (comment) Does the patient have difficulty walking or climbing stairs?: Yes Weakness of Legs: Both Weakness of Arms/Hands: None  Permission Sought/Granted   Permission granted to share information with : Yes, Verbal Permission Granted  Share Information with NAME: Jeannie Crutchfield sister           Emotional Assessment       Orientation: : Oriented to Self, Oriented to Place, Oriented to  Time, Oriented to Situation Alcohol / Substance Use: Not Applicable Psych Involvement: No (comment)  Admission  diagnosis:  Acute pulmonary edema (HCC) [J81.0] Elevated troponin [R77.8] Acute respiratory failure with hypoxia (HCC) [J96.01] Atrial fibrillation with RVR (HCC) [I48.91] Elevated lactic acid level [R79.89] Sepsis with acute hypoxic respiratory failure without septic shock, due to unspecified  organism ([place]) [A41.9, R65.20, J96.01] COVID-19 [U07.1] Patient Active Problem List   Diagnosis Date Noted  . Acute respiratory failure with hypoxia ([place]) 06/09/2020  . Candidal skin infection 06/09/2020  . Atrial fibrillation with RVR ([place]) 06/09/2020  . Acute on chronic diastolic CHF (congestive heart failure) ([place]) 06/09/2020  . Hypokalemia 06/09/2020  . Diabetes mellitus type 2 in obese ([place]) 06/09/2020  . Right hip pain 06/09/2020  . Pneumonia due to COVID-19 virus 06/09/2020  . Cellulitis 05/09/2016  . Wound infection 05/09/2016  . Sepsis ([place]) 05/09/2016  . Hypertension   . Diabetes mellitus without complication ([place])   . Obesity   . Hypothyroid   . Thyroid activity decreased   . Chronic systolic CHF (congestive heart failure) ([place]) 09/21/2014  . OSA (obstructive sleep apnea) 09/15/2014  . Ankle fracture, left 09/15/2014  . Acute renal failure ([place]) 09/14/2014  . Shock ([place]) 09/13/2014   PCP:  Lavone Orn, MD Pharmacy:   [id number] - [place], [place] [address] [place] [address] Phone: [phone] Fax: [phone]  [facility] - [place], [place] [address] Phone: [phone] Fax: [phone]     Social Determinants of Health (SDOH) Interventions    Readmission Risk Interventions No flowsheet data found.

## 2020-06-11 NOTE — Progress Notes (Addendum)
Progress Note    Kathy Simpson  HMC:[id number] DOB: Jan 01, 1951  DOA: 06/09/2020 PCP: Lavone Orn, MD      Brief Narrative:    Medical records reviewed and are as summarized below:  Kathy Simpson is a 69 y.o. female with medical history significant for hypertension, chronic diastolic CHF, hypothyroidism, type 2 diabetes mellitus, morbid obesity, presented to the hospital because of increasing shortness of breath.  She also complained of nausea, vomiting, headache and right hip pain.  She was found to have acute hypoxemic respiratory failure, acute exacerbation of chronic diastolic CHF and sepsis secondary to COVID-19 pneumonia.      Assessment/Plan:   Principal Problem:   Pneumonia due to COVID-19 virus Active Problems:   Acute respiratory failure with hypoxia (HCC)   Candidal skin infection   Atrial fibrillation with RVR (HCC)   Acute on chronic diastolic CHF (congestive heart failure) (HCC)   Hypokalemia   Diabetes mellitus type 2 in obese (HCC)   Right hip pain   Sepsis secondary to COVID-19 pneumonia: Continue IV remdesivir and oral dexamethasone.  Underlying bacterial pneumonia is unlikely so IV antibiotics have been discontinued.  Acute exacerbation of chronic diastolic CHF: Continue oral Lasix.  Atrial fibrillation with RVR: Converted to normal sinus rhythm.  Full dose Lovenox has been changed to oral Eliquis.  Discussed management of atrial fibrillation including stroke prophylaxis with long-term anticoagulation.  Patient is agreeable to proceed with long-term anticoagulation with Eliquis.  Benefits, risks and alternatives of anticoagulation were discussed.  Hypokalemia: Improved.  Adrenal insufficiency: Continue oral dexamethasone for now.  Patient will be switched back to home dose of hydrocortisone at discharge.  Insulin-dependent diabetes mellitus with hyperglycemia: Increase dose of NovoLog Mix to 55 units twice daily (from 50 units twice daily).   Check hemoglobin A1c.  Monitor glucose levels and adjust insulin accordingly.  Restless leg syndrome: Continue Requip.  Right hip pain: No acute fracture or dislocation noted on hip x-ray.  Probably due to arthritis.  Analgesics as needed for pain.  Body mass index is 59.48 kg/m.  (Morbid obesity): This complicates care and overall prognosis.  Diet Order            Diet heart healthy/carb modified Room service appropriate? Yes; Fluid consistency: Thin; Fluid restriction: 2000 mL Fluid  Diet effective now                       Medications:   . albuterol  2 puff Inhalation Q6H  . apixaban  5 mg Oral BID  . vitamin C  500 mg Oral Daily  . [START ON 06/12/2020] dexamethasone  8 mg Oral Daily  . escitalopram  20 mg Oral QHS  . furosemide  40 mg Oral Daily  . gabapentin  600 mg Oral BID  . insulin aspart  0-15 Units Subcutaneous TID WC  . insulin aspart protamine- aspart  55 Units Subcutaneous BID WC  . levothyroxine  175 mcg Oral QAC breakfast  . nystatin   Topical BID  . rOPINIRole  2 mg Oral q AM   And  . rOPINIRole  1 mg Oral QHS  . senna  2 tablet Oral QHS  . sodium chloride flush  3 mL Intravenous Q12H  . zinc sulfate  220 mg Oral Daily   Continuous Infusions: . remdesivir 100 mg in NS 100 mL 100 mg (06/11/20 0906)     Anti-infectives (From admission, onward)   Start  Dose/Rate Route Frequency Ordered Stop   06/10/20 1000  remdesivir 100 mg in sodium chloride 0.9 % 100 mL IVPB     Discontinue    "Followed by" Linked Group Details   100 mg 200 mL/hr over 30 Minutes Intravenous Daily 06/09/20 1043 06/14/20 0959   06/09/20 2000  vancomycin (VANCOREADY) IVPB 1250 mg/250 mL  Status:  Discontinued        1,250 mg 166.7 mL/hr over 90 Minutes Intravenous Every 12 hours 06/09/20 0754 06/09/20 1825   06/09/20 1400  ceFEPIme (MAXIPIME) 2 g in sodium chloride 0.9 % 100 mL IVPB  Status:  Discontinued        2 g 200 mL/hr over 30 Minutes Intravenous Every 8 hours  06/09/20 0754 06/09/20 1825   06/09/20 1100  remdesivir 200 mg in sodium chloride 0.9% 250 mL IVPB       "Followed by" Linked Group Details   200 mg 580 mL/hr over 30 Minutes Intravenous Once 06/09/20 1043 06/09/20 1436   06/09/20 0800  ceFEPIme (MAXIPIME) 2 g in sodium chloride 0.9 % 100 mL IVPB        2 g 200 mL/hr over 30 Minutes Intravenous  Once 06/09/20 0747 06/09/20 0933   06/09/20 0800  metroNIDAZOLE (FLAGYL) IVPB 500 mg        500 mg 100 mL/hr over 60 Minutes Intravenous  Once 06/09/20 0747 06/09/20 1007   06/09/20 0800  vancomycin (VANCOCIN) IVPB 1000 mg/200 mL premix  Status:  Discontinued        1,000 mg 200 mL/hr over 60 Minutes Intravenous  Once 06/09/20 0747 06/09/20 0752   06/09/20 0800  vancomycin (VANCOREADY) IVPB 2000 mg/400 mL        2,000 mg 200 mL/hr over 120 Minutes Intravenous  Once 06/09/20 0752 06/09/20 1338             Family Communication/Anticipated D/C date and plan/Code Status   DVT prophylaxis:      Code Status: Full Code  Family Communication: Plan discussed with her daughter, Maudie Mercury Disposition Plan:    Status is: Inpatient  Remains inpatient appropriate because:IV treatments appropriate due to intensity of illness or inability to take PO and Inpatient level of care appropriate due to severity of illness   Dispo: The patient is from: Home              Anticipated d/c is to: Home              Anticipated d/c date is: 2 days              Patient currently is not medically stable to d/c.           Subjective:   She feels short of breath with little activity.  She feels congested and she is trying to cough it up but she is unable to cough.  Objective:    Vitals:   06/10/20 1940 06/10/20 2319 06/11/20 0330 06/11/20 0950  BP: (!) 145/60 (!) 131/52 136/65 (!) 150/74  Pulse: 83 79 70 79  Resp: 17 20 18 19   Temp: 98.1 F (36.7 C) 98.3 F (36.8 C) 97.6 F (36.4 C) 98 F (36.7 C)  TempSrc: Oral Oral Oral Oral  SpO2: 96%  93% 95% 95%  Weight:   (!) 147.5 kg   Height:       No data found.   Intake/Output Summary (Last 24 hours) at 06/11/2020 1032 Last data filed at 06/11/2020 1027 Gross per 24 hour  Intake  920 ml  Output 500 ml  Net 420 ml   Filed Weights   06/09/20 1744 06/10/20 0500 06/11/20 0330  Weight: (!) 151.1 kg (!) 148.7 kg (!) 147.5 kg    Exam:  GEN: No acute distress SKIN: Warm and dry EYES: No pallor or icterus ENT: MMM CV: RRR PULM: No wheezing or rales heard ABD: soft, obese, NT, +BS CNS: AAO x 3, non focal EXT: No edema or tenderness   Data Reviewed:   I have personally reviewed following labs and imaging studies:  Labs: Labs show the following:   Basic Metabolic Panel: Recent Labs  Lab 06/09/20 0602 06/09/20 0602 06/09/20 0754 06/10/20 0332 06/11/20 0518  NA 138  --   --  142 137  K 2.9*   < >  --  4.3 4.4  CL 97*  --   --  100 101  CO2 29  --   --  32 25  GLUCOSE 147*  --   --  298* 278*  BUN 9  --   --  9 15  CREATININE 0.86  --   --  0.93 0.73  CALCIUM 8.3*  --   --  8.3* 8.2*  MG  --   --  1.8 2.2 2.1  PHOS  --   --   --  3.7 2.8   < > = values in this interval not displayed.   GFR Estimated Creatinine Clearance: 96 mL/min (by C-G formula based on SCr of 0.73 mg/dL). Liver Function Tests: Recent Labs  Lab 06/09/20 0754 06/10/20 0332 06/11/20 0518  AST 37 32 36  ALT 29 28 25   ALKPHOS 44 47 51  BILITOT 0.6 0.6 1.0  PROT 6.3* 6.4* 6.6  ALBUMIN 2.9* 3.2* 3.2*   No results for input(s): LIPASE, AMYLASE in the last 168 hours. No results for input(s): AMMONIA in the last 168 hours. Coagulation profile Recent Labs  Lab 06/09/20 0754  INR 1.1    CBC: Recent Labs  Lab 06/09/20 0602 06/10/20 0332 06/11/20 0518  WBC 4.1 4.1 8.9  NEUTROABS  --  2.9 7.2  HGB 13.8 12.3 13.1  HCT 43.1 39.2 41.4  MCV 90.9 92.2 92.6  PLT 123* 115* 148*   Cardiac Enzymes: No results for input(s): CKTOTAL, CKMB, CKMBINDEX, TROPONINI in the last 168  hours. BNP (last 3 results) No results for input(s): PROBNP in the last 8760 hours. CBG: Recent Labs  Lab 06/10/20 0732 06/10/20 1215 06/10/20 1739 06/10/20 2126 06/11/20 0721  GLUCAP 252* 295* 381* 335* 238*   D-Dimer: Recent Labs    06/10/20 0332 06/11/20 0518  DDIMER 0.89* 0.56*   Hgb A1c: No results for input(s): HGBA1C in the last 72 hours. Lipid Profile: No results for input(s): CHOL, HDL, LDLCALC, TRIG, CHOLHDL, LDLDIRECT in the last 72 hours. Thyroid function studies: Recent Labs    06/09/20 1224  TSH <0.010*   Anemia work up: Recent Labs    06/10/20 0332 06/11/20 0518  FERRITIN 361* 369*   Sepsis Labs: Recent Labs  Lab 06/09/20 0602 06/09/20 0605 06/09/20 0754 06/09/20 1230 06/10/20 0332 06/11/20 0518  PROCALCITON  --   --  <0.10  --   --   --   WBC 4.1  --   --   --  4.1 8.9  LATICACIDVEN  --  1.7 3.1* 1.6  --   --     Microbiology Recent Results (from the past 240 hour(s))  Blood Culture (routine x 2)  Status: None (Preliminary result)   Collection Time: 06/09/20  8:17 AM   Specimen: BLOOD LEFT FOREARM  Result Value Ref Range Status   Specimen Description BLOOD LEFT FOREARM  Final   Special Requests   Final    BOTTLES DRAWN AEROBIC AND ANAEROBIC Blood Culture results may not be optimal due to an inadequate volume of blood received in culture bottles   Culture   Final    NO GROWTH 1 DAY Performed at [facility], [place] [address], [place], [place] [address]    Report Status PENDING  Incomplete  Blood Culture (routine x 2)     Status: None (Preliminary result)   Collection Time: 06/09/20  8:30 AM   Specimen: BLOOD  Result Value Ref Range Status   Specimen Description BLOOD SITE NOT SPECIFIED  Final   Special Requests   Final    BOTTLES DRAWN AEROBIC AND ANAEROBIC Blood Culture results may not be optimal due to an inadequate volume of blood received in culture bottles   Culture   Final    NO GROWTH 1 DAY Performed at [facility], [place] [address], [place], [place] [address]    Report Status PENDING  Incomplete  SARS Coronavirus 2 by RT PCR (hospital order, performed in [facility] hospital lab) Nasopharyngeal Nasopharyngeal Swab     Status: Abnormal   Collection Time: 06/09/20  8:30 AM   Specimen: Nasopharyngeal Swab  Result Value Ref Range Status   SARS Coronavirus 2 POSITIVE (A) NEGATIVE Final    Comment: RESULT CALLED TO, READ BACK BY AND VERIFIED WITH: RN JESSICA EASLEY 0945 06/09/2020 KB (NOTE) SARS-CoV-2 target nucleic acids are DETECTED  SARS-CoV-2 RNA is generally detectable in upper respiratory specimens  during the acute phase of infection.  Positive results are indicative  of the presence of the identified virus, but do not rule out bacterial infection or co-infection with other pathogens not detected by the test.  Clinical correlation with patient history and  other diagnostic information is necessary to determine patient infection status.  The expected result is negative.  Fact Sheet for Patients:   StrictlyIdeas.no   Fact Sheet for Healthcare Providers:   BankingDealers.co.za    This test is not yet approved or cleared by the [place] FDA and  has been authorized for detection and/or diagnosis of SARS-CoV-2 by FDA under an Emergency Use Authorization (EUA).  This EUA will remain in effect (meaning this  test can be used) for the duration of  the COVID-19 declaration under Section 564(b)(1) of the Act, 21 U.S.C. section 360-bbb-3(b)(1), unless the authorization is terminated or revoked sooner.  Performed at [facility], [place] [address] [place], [place] [address]   MRSA PCR Screening     Status: None   Collection Time: 06/10/20  9:04 AM   Specimen: Nasal Mucosa; Nasopharyngeal  Result Value Ref Range Status   MRSA by PCR NEGATIVE NEGATIVE Final    Comment:        The GeneXpert MRSA Assay (FDA approved for NASAL  specimens only), is one component of a comprehensive MRSA colonization surveillance program. It is not intended to diagnose MRSA infection nor to guide or monitor treatment for MRSA infections. Performed at [facility], [place] [address], [place], [place] [address]     Procedures and diagnostic studies:  DG HIP UNILAT WITH PELVIS 2-3 VIEWS RIGHT  Result Date: 06/09/2020 CLINICAL DATA:  69 year old female with right hip pain. EXAM: DG HIP (  WITH OR WITHOUT PELVIS) 2-3V RIGHT COMPARISON:  CT abdomen pelvis dated 04/28/2016. FINDINGS: Evaluation is limited due to osteopenia and soft tissue attenuation. There is no acute fracture or dislocation. The bones are osteopenic. Partially visualized lower lumbar fusion hardware. Soft tissues are unremarkable. IMPRESSION: No acute fracture or dislocation. Electronically Signed   By: Anner Crete M.D.   On: 06/09/2020 21:41   ECHOCARDIOGRAM LIMITED  Result Date: 06/09/2020    ECHOCARDIOGRAM LIMITED REPORT   Patient Name:   GRACIEMAE DELISLE Date of Exam: 06/09/2020 Medical Rec #:  [id number]       Height:       63.0 in Accession #:    [id number]      Weight:       309.0 lb Date of Birth:  Jan 16, 1951       BSA:          2.326 m Patient Age:    12 years        BP:           83/25 mmHg Patient Gender: F               HR:           80 bpm. Exam Location:  Inpatient Procedure: Limited Echo, Color Doppler, Cardiac Doppler and Intracardiac            Opacification Agent Indications:    I48.91* Unspecified atrial fibrillation  History:        Patient has prior history of Echocardiogram examinations, most                 recent 05/09/2016. CHF; Risk Factors:Hypertension, Diabetes and                 Sleep Apnea. COVID+ 06/09/20.  Sonographer:    Raquel Sarna Senior RDCS Referring Phys: [phone] RONDELL A SMITH  Sonographer Comments: Technically difficult due to patient body habitus. IMPRESSIONS  1. Left ventricular ejection fraction, by estimation, is 55 to 60%. The left  ventricle has normal function. The left ventricle demonstrates regional wall motion abnormalities (see scoring diagram/findings for description).  2. Moderate pericardial effusion. The pericardial effusion is circumferential. There is no evidence of cardiac tamponade.  3. The aortic valve is tricuspid.  4. There is normal pulmonary artery systolic pressure.  5. The inferior vena cava is dilated in size with <50% respiratory variability, suggesting right atrial pressure of 15 mmHg. FINDINGS  Left Ventricle: Apical akinesis. Left ventricular ejection fraction, by estimation, is 55 to 60%. The left ventricle has normal function. The left ventricle demonstrates regional wall motion abnormalities. Definity contrast agent was given IV to delineate the left ventricular endocardial borders. Right Ventricle: There is normal pulmonary artery systolic pressure. The tricuspid regurgitant velocity is 2.02 m/s, and with an assumed right atrial pressure of 15 mmHg, the estimated right ventricular systolic pressure is 68.1 mmHg. Pericardium: Pericardial effusion measuring 1.87 cm. A moderately sized pericardial effusion is present. The pericardial effusion is circumferential. There is excessive respiratory variation in the tricuspid valve spectral Doppler velocities. There is no  evidence of cardiac tamponade. Tricuspid Valve: Tricuspid valve regurgitation is trivial. Aortic Valve: The aortic valve is tricuspid. Venous: The inferior vena cava is dilated in size with less than 50% respiratory variability, suggesting right atrial pressure of 15 mmHg.  TRICUSPID VALVE TR Peak grad:   16.3 mmHg TR Vmax:        202.00 cm/s Skeet Latch MD Electronically signed by Skeet Latch MD Signature  Date/Time: 06/09/2020/4:27:03 PM    Final                LOS: 2 days   Parv Manthey  [facility]     06/11/2020, 10:32 AM

## 2020-06-12 LAB — CBC WITH DIFFERENTIAL/PLATELET
Abs Immature Granulocytes: 0.04 10*3/uL (ref 0.00–0.07)
Basophils Absolute: 0 10*3/uL (ref 0.0–0.1)
Basophils Relative: 0 %
Eosinophils Absolute: 0 10*3/uL (ref 0.0–0.5)
Eosinophils Relative: 0 %
HCT: 41.6 % (ref 36.0–46.0)
Hemoglobin: 13.3 g/dL (ref 12.0–15.0)
Immature Granulocytes: 1 %
Lymphocytes Relative: 18 %
Lymphs Abs: 1.5 10*3/uL (ref 0.7–4.0)
MCH: 29.5 pg (ref 26.0–34.0)
MCHC: 32 g/dL (ref 30.0–36.0)
MCV: 92.2 fL (ref 80.0–100.0)
Monocytes Absolute: 0.5 10*3/uL (ref 0.1–1.0)
Monocytes Relative: 6 %
Neutro Abs: 6.4 10*3/uL (ref 1.7–7.7)
Neutrophils Relative %: 75 %
Platelets: 170 10*3/uL (ref 150–400)
RBC: 4.51 MIL/uL (ref 3.87–5.11)
RDW: 13.6 % (ref 11.5–15.5)
WBC: 8.4 10*3/uL (ref 4.0–10.5)
nRBC: 0 % (ref 0.0–0.2)

## 2020-06-12 LAB — COMPREHENSIVE METABOLIC PANEL
ALT: 29 U/L (ref 0–44)
AST: 31 U/L (ref 15–41)
Albumin: 3.4 g/dL — ABNORMAL LOW (ref 3.5–5.0)
Alkaline Phosphatase: 47 U/L (ref 38–126)
Anion gap: 16 — ABNORMAL HIGH (ref 5–15)
BUN: 16 mg/dL (ref 8–23)
CO2: 26 mmol/L (ref 22–32)
Calcium: 8.2 mg/dL — ABNORMAL LOW (ref 8.9–10.3)
Chloride: 93 mmol/L — ABNORMAL LOW (ref 98–111)
Creatinine, Ser: 0.74 mg/dL (ref 0.44–1.00)
GFR calc Af Amer: >60 mL/min (ref >60)
GFR calc non Af Amer: >60 mL/min (ref >60)
Glucose, Bld: 325 mg/dL — ABNORMAL HIGH (ref 70–99)
Potassium: 3.6 mmol/L (ref 3.5–5.1)
Sodium: 135 mmol/L (ref 135–145)
Total Bilirubin: 0.7 mg/dL (ref 0.3–1.2)
Total Protein: 7.3 g/dL (ref 6.5–8.1)

## 2020-06-12 LAB — GLUCOSE, CAPILLARY
Glucose-Capillary: 242 mg/dL — ABNORMAL HIGH (ref 70–99)
Glucose-Capillary: 279 mg/dL — ABNORMAL HIGH (ref 70–99)
Glucose-Capillary: 344 mg/dL — ABNORMAL HIGH (ref 70–99)
Glucose-Capillary: 377 mg/dL — ABNORMAL HIGH (ref 70–99)

## 2020-06-12 LAB — PHOSPHORUS: Phosphorus: 3 mg/dL (ref 2.5–4.6)

## 2020-06-12 LAB — MAGNESIUM: Magnesium: 2.1 mg/dL (ref 1.7–2.4)

## 2020-06-12 LAB — TSH: TSH: <0.01 u[IU]/mL — ABNORMAL LOW (ref 0.350–4.500)

## 2020-06-12 LAB — D-DIMER, QUANTITATIVE: D-Dimer, Quant: 0.39 ug/mL-FEU (ref 0.00–0.50)

## 2020-06-12 LAB — T4, FREE: Free T4: 1.22 ng/dL — ABNORMAL HIGH (ref 0.61–1.12)

## 2020-06-12 LAB — FERRITIN: Ferritin: 399 ng/mL — ABNORMAL HIGH (ref 11–307)

## 2020-06-12 LAB — C-REACTIVE PROTEIN: CRP: 3.2 mg/dL — ABNORMAL HIGH (ref <1.0)

## 2020-06-12 MED ORDER — DEXAMETHASONE 4 MG PO TABS
4.0000 mg | ORAL_TABLET | Freq: Every day | ORAL | Status: DC
  Administered 2020-06-12 – 2020-06-13 (×2): 4 mg via ORAL
  Filled 2020-06-12 (×2): qty 1

## 2020-06-12 MED ORDER — METOPROLOL TARTRATE 25 MG PO TABS
25.0000 mg | ORAL_TABLET | Freq: Two times a day (BID) | ORAL | Status: DC
  Administered 2020-06-12 – 2020-06-13 (×4): 25 mg via ORAL
  Filled 2020-06-12 (×4): qty 1

## 2020-06-12 NOTE — Progress Notes (Signed)
SATURATION QUALIFICATIONS: (This note is used to comply with regulatory documentation for home oxygen)  Patient Saturations on Room Air at Rest = 94%  Patient Saturations on Room Air while Ambulating = 86%  Patient Saturations on 2 Liters of oxygen while Ambulating = 96%  Please briefly explain why patient needs home oxygen: Pt O2 sat dipped into mid 80s once ambulated out of room. Placed on 2L [place] with O2 going back up to 90s.

## 2020-06-12 NOTE — Progress Notes (Signed)
Progress Note    Kathy Simpson  HAL:[id number] DOB: 1951/05/24  DOA: 06/09/2020 PCP: Lavone Orn, MD      Brief Narrative:    Medical records reviewed and are as summarized below:  Kathy Simpson is a 69 y.o. female with medical history significant for hypertension, chronic diastolic CHF, hypothyroidism, type 2 diabetes mellitus, morbid obesity, presented to the hospital because of increasing shortness of breath.  She also complained of nausea, vomiting, headache and right hip pain.  She was found to have acute hypoxemic respiratory failure, acute exacerbation of chronic diastolic CHF and sepsis secondary to COVID-19 pneumonia.   Assessment/Plan:   Sepsis secondary to COVID-19 pneumonia with acute hypoxic respiratory failure.  Treated with IV remdesivir and Decadron, much improved, titrate down oxygen, encouraged to sit up in chair use I-S and flutter valve for pulmonary toiletry.  Finish remdesivir course, taper down steroids likely discharge 06/13/2020 on home oxygen.  Mild acute exacerbation of chronic diastolic CHF EF 73% on this admission: Continue oral Lasix.  Paroxysmal atrial fibrillation with RVR [place] vas 2 score of greater than 2: Converted to normal sinus rhythm.  He is on Eliquis and Lopressor.  TSH appears to be suppressed but has history of hypopituitarism, will check free T4 and T3, Echo stable.  Hypokalemia: Improved.  Adrenal insufficiency: Continue oral dexamethasone for now.  Patient will be switched back to home dose of hydrocortisone at discharge.  Insulin-dependent diabetes mellitus with hyperglycemia: Increase dose of NovoLog Mix to 55 units twice daily (from 50 units twice daily).  Check hemoglobin A1c.  Monitor glucose levels and adjust insulin accordingly.  Restless leg syndrome: Continue Requip.  Right hip pain: No acute fracture or dislocation noted on hip x-ray.  Probably due to arthritis.  Analgesics as needed for pain.  Body mass index is  59.48 kg/m.  (Morbid obesity): This complicates care and overall prognosis.  Diet Order            Diet heart healthy/carb modified Room service appropriate? Yes; Fluid consistency: Thin; Fluid restriction: 2000 mL Fluid  Diet effective now                Lab Results  Component Value Date   HGBA1C 6.9 (H) 06/11/2020   CBG (last 3)  Recent Labs    06/11/20 1602 06/11/20 2058 06/12/20 0737  GLUCAP 310* 270* 242*     Medications:   . albuterol  2 puff Inhalation Q6H  . apixaban  5 mg Oral BID  . vitamin C  500 mg Oral Daily  . dexamethasone  4 mg Oral Daily  . escitalopram  20 mg Oral QHS  . furosemide  40 mg Oral Daily  . gabapentin  600 mg Oral BID  . insulin aspart  0-15 Units Subcutaneous TID WC  . insulin aspart protamine- aspart  55 Units Subcutaneous BID WC  . levothyroxine  175 mcg Oral QAC breakfast  . nystatin   Topical BID  . rOPINIRole  2 mg Oral q AM   And  . rOPINIRole  1 mg Oral QHS  . senna  2 tablet Oral QHS  . sodium chloride flush  3 mL Intravenous Q12H  . zinc sulfate  220 mg Oral Daily   Continuous Infusions: . remdesivir 100 mg in NS 100 mL 100 mg (06/12/20 0931)     Anti-infectives (From admission, onward)   Start     Dose/Rate Route Frequency Ordered Stop   06/10/20 1000  remdesivir 100 mg in sodium chloride 0.9 % 100 mL IVPB     Discontinue    "Followed by" Linked Group Details   100 mg 200 mL/hr over 30 Minutes Intravenous Daily 06/09/20 1043 06/14/20 0959   06/09/20 2000  vancomycin (VANCOREADY) IVPB 1250 mg/250 mL  Status:  Discontinued        1,250 mg 166.7 mL/hr over 90 Minutes Intravenous Every 12 hours 06/09/20 0754 06/09/20 1825   06/09/20 1400  ceFEPIme (MAXIPIME) 2 g in sodium chloride 0.9 % 100 mL IVPB  Status:  Discontinued        2 g 200 mL/hr over 30 Minutes Intravenous Every 8 hours 06/09/20 0754 06/09/20 1825   06/09/20 1100  remdesivir 200 mg in sodium chloride 0.9% 250 mL IVPB       "Followed by" Linked Group  Details   200 mg 580 mL/hr over 30 Minutes Intravenous Once 06/09/20 1043 06/09/20 1436   06/09/20 0800  ceFEPIme (MAXIPIME) 2 g in sodium chloride 0.9 % 100 mL IVPB        2 g 200 mL/hr over 30 Minutes Intravenous  Once 06/09/20 0747 06/09/20 0933   06/09/20 0800  metroNIDAZOLE (FLAGYL) IVPB 500 mg        500 mg 100 mL/hr over 60 Minutes Intravenous  Once 06/09/20 0747 06/09/20 1007   06/09/20 0800  vancomycin (VANCOCIN) IVPB 1000 mg/200 mL premix  Status:  Discontinued        1,000 mg 200 mL/hr over 60 Minutes Intravenous  Once 06/09/20 0747 06/09/20 0752   06/09/20 0800  vancomycin (VANCOREADY) IVPB 2000 mg/400 mL        2,000 mg 200 mL/hr over 120 Minutes Intravenous  Once 06/09/20 0752 06/09/20 1338       Family Communication/Anticipated D/C date and plan/Code Status   DVT prophylaxis:  Eliquis     Code Status: Full Code  Family Communication: Plan discussed with her daughter, Maudie Mercury Disposition Plan:    Status is: Inpatient  Remains inpatient appropriate because:IV treatments appropriate due to intensity of illness or inability to take PO and Inpatient level of care appropriate due to severity of illness   Dispo: The patient is from: Home              Anticipated d/c is to: Home              Anticipated d/c date is: 2 days              Patient currently is not medically stable to d/c.    Subjective:   Patient in bed, appears comfortable, denies any headache, no fever, no chest pain or pressure, no shortness of breath , no abdominal pain. No focal weakness.   Objective:    Vitals:   06/11/20 1317 06/11/20 2005 06/12/20 0009 06/12/20 0439  BP: 130/72 (!) 115/97 (!) 135/58 (!) 148/73  Pulse: 78 76 69 72  Resp: (!) 22 12 20 13   Temp: 98.6 F (37 C) 98.1 F (36.7 C) 97.7 F (36.5 C) 98.6 F (37 C)  TempSrc: Oral Oral Oral Oral  SpO2: 95% 96% 97% 97%  Weight:      Height:       No data found.   Intake/Output Summary (Last 24 hours) at 06/12/2020  1119 Last data filed at 06/12/2020 0810 Gross per 24 hour  Intake 600 ml  Output --  Net 600 ml   Filed Weights   06/09/20 1744 06/10/20 0500 06/11/20 0330  Weight: (!) 151.1 kg (!) 148.7 kg (!) 147.5 kg    Exam:  Awake Alert, No new F.N deficits, Normal affect [place].AT,PERRAL Supple Neck,No JVD, No cervical lymphadenopathy appriciated.  Symmetrical Chest wall movement, Good air movement bilaterally, CTAB RRR,No Gallops, Rubs or new Murmurs, No Parasternal Heave +ve B.Sounds, Abd Soft, No tenderness, No organomegaly appriciated, No rebound - guarding or rigidity. No Cyanosis, Clubbing or edema, No new Rash or bruise    Data Reviewed:   I have personally reviewed following labs and imaging studies:  Labs:  Recent Labs  Lab 06/09/20 0602 06/10/20 0332 06/11/20 0518 06/12/20 0834  WBC 4.1 4.1 8.9 8.4  HGB 13.8 12.3 13.1 13.3  HCT 43.1 39.2 41.4 41.6  PLT 123* 115* 148* 170  MCV 90.9 92.2 92.6 92.2  MCH 29.1 28.9 29.3 29.5  MCHC 32.0 31.4 31.6 32.0  RDW 13.7 14.0 13.6 13.6  LYMPHSABS  --  0.8 1.2 1.5  MONOABS  --  0.3 0.5 0.5  EOSABS  --  0.0 0.0 0.0  BASOSABS  --  0.0 0.0 0.0    Recent Labs  Lab 06/09/20 0602 06/09/20 0605 06/09/20 0746 06/09/20 0754 06/09/20 1224 06/09/20 1230 06/10/20 0332 06/11/20 0518 06/11/20 1300 06/12/20 0834  NA 138  --   --   --   --   --  142 137  --  135  K 2.9*  --   --   --   --   --  4.3 4.4  --  3.6  CL 97*  --   --   --   --   --  100 101  --  93*  CO2 29  --   --   --   --   --  32 25  --  26  GLUCOSE 147*  --   --   --   --   --  298* 278*  --  325*  BUN 9  --   --   --   --   --  9 15  --  16  CREATININE 0.86  --   --   --   --   --  0.93 0.73  --  0.74  CALCIUM 8.3*  --   --   --   --   --  8.3* 8.2*  --  8.2*  AST  --   --   --  37  --   --  32 36  --  31  ALT  --   --   --  29  --   --  28 25  --  29  ALKPHOS  --   --   --  44  --   --  47 51  --  47  BILITOT  --   --   --  0.6  --   --  0.6 1.0  --  0.7   ALBUMIN  --   --   --  2.9*  --   --  3.2* 3.2*  --  3.4*  MG  --   --   --  1.8  --   --  2.2 2.1  --  2.1  CRP  --   --   --   --  5.5*  --  11.6* 7.0*  --  3.2*  DDIMER  --   --   --  0.89*  --   --  0.89* 0.56*  --  0.39  PROCALCITON  --   --   --  <  0.10  --   --   --   --   --   --   LATICACIDVEN  --  1.7  --  3.1*  --  1.6  --   --   --   --   INR  --   --   --  1.1  --   --   --   --   --   --   TSH  --   --   --   --  <0.010*  --   --   --   --   --   HGBA1C  --   --   --   --   --   --   --   --  6.9*  --   BNP  --   --  120.4*  --   --   --   --   --   --   --     Recent Labs  Lab 06/09/20 0605 06/09/20 0746 06/09/20 0754 06/09/20 0830 06/09/20 1224 06/09/20 1230 06/10/20 0332 06/11/20 0518 06/12/20 0834  CRP  --   --   --   --  5.5*  --  11.6* 7.0* 3.2*  DDIMER  --   --  0.89*  --   --   --  0.89* 0.56* 0.39  BNP  --  120.4*  --   --   --   --   --   --   --   PROCALCITON  --   --  <0.10  --   --   --   --   --   --   LATICACIDVEN 1.7  --  3.1*  --   --  1.6  --   --   --   SARSCOV2NAA  --   --   --  POSITIVE*  --   --   --   --   --        Microbiology Recent Results (from the past 240 hour(s))  Blood Culture (routine x 2)     Status: None (Preliminary result)   Collection Time: 06/09/20  8:17 AM   Specimen: BLOOD LEFT FOREARM  Result Value Ref Range Status   Specimen Description BLOOD LEFT FOREARM  Final   Special Requests   Final    BOTTLES DRAWN AEROBIC AND ANAEROBIC Blood Culture results may not be optimal due to an inadequate volume of blood received in culture bottles   Culture   Final    NO GROWTH 2 DAYS Performed at [facility], [address], [place], [place] [address]    Report Status PENDING  Incomplete  Blood Culture (routine x 2)     Status: None (Preliminary result)   Collection Time: 06/09/20  8:30 AM   Specimen: BLOOD  Result Value Ref Range Status   Specimen Description BLOOD SITE NOT SPECIFIED  Final   Special Requests   Final     BOTTLES DRAWN AEROBIC AND ANAEROBIC Blood Culture results may not be optimal due to an inadequate volume of blood received in culture bottles   Culture   Final    NO GROWTH 2 DAYS Performed at [facility], [place] [address] [place],  [address]    Report Status PENDING  Incomplete  SARS Coronavirus 2 by RT PCR (hospital order, performed in [facility] hospital lab) Nasopharyngeal Nasopharyngeal Swab     Status: Abnormal   Collection Time: 06/09/20  8:30 AM   Specimen: Nasopharyngeal Swab  Result Value Ref Range Status   SARS Coronavirus 2 POSITIVE (A) NEGATIVE Final    Comment: RESULT CALLED TO, READ BACK BY AND VERIFIED WITH: RN JESSICA EASLEY 0945 06/09/2020 KB (NOTE) SARS-CoV-2 target nucleic acids are DETECTED  SARS-CoV-2 RNA is generally detectable in upper respiratory specimens  during the acute phase of infection.  Positive results are indicative  of the presence of the identified virus, but do not rule out bacterial infection or co-infection with other pathogens not detected by the test.  Clinical correlation with patient history and  other diagnostic information is necessary to determine patient infection status.  The expected result is negative.  Fact Sheet for Patients:   StrictlyIdeas.no   Fact Sheet for Healthcare Providers:   BankingDealers.co.za    This test is not yet approved or cleared by the [place] FDA and  has been authorized for detection and/or diagnosis of SARS-CoV-2 by FDA under an Emergency Use Authorization (EUA).  This EUA will remain in effect (meaning this  test can be used) for the duration of  the COVID-19 declaration under Section 564(b)(1) of the Act, 21 U.S.C. section 360-bbb-3(b)(1), unless the authorization is terminated or revoked sooner.  Performed at [facility], [place] [address], [place], [place] [address]   MRSA PCR Screening     Status: None   Collection Time:  06/10/20  9:04 AM   Specimen: Nasal Mucosa; Nasopharyngeal  Result Value Ref Range Status   MRSA by PCR NEGATIVE NEGATIVE Final    Comment:        The GeneXpert MRSA Assay (FDA approved for NASAL specimens only), is one component of a comprehensive MRSA colonization surveillance program. It is not intended to diagnose MRSA infection nor to guide or monitor treatment for MRSA infections. Performed at [facility], [place] [address] [place], [place] [address]     Procedures and diagnostic studies:  No results found.  Signature  Lala Lund M.D on 06/12/2020 at 11:19 AM   -  To page go to [email or web address]

## 2020-06-12 NOTE — Progress Notes (Addendum)
Patient had a 12 beat run of SVT. Notified by CCMD.  Asymptomatic & stable. Patient stated she was having a dream. Vitals rechecked and back in the green. MD on call  notifed

## 2020-06-12 NOTE — Progress Notes (Signed)
CCMD notified RN of 12 beat run of SVT. Pt asymptomatic and said she just had a coughing spell. Dr. Candiss Norse notified. Tele ordered to be discontinued.

## 2020-06-12 NOTE — Progress Notes (Signed)
   06/11/20 2331  Assess: MEWS Score  ECG Heart Rate (!) 147  Assess: MEWS Score  MEWS Temp 0  MEWS Systolic 0  MEWS Pulse 3  MEWS RR 1  MEWS LOC 0  MEWS Score 4  MEWS Score Color Red  Assess: if the MEWS score is Yellow or Red  Were vital signs taken at a resting state? Yes  Focused Assessment Documented focused assessment  Early Detection of Sepsis Score *See Row Information* Low  MEWS guidelines implemented *See Row Information* No, vital signs rechecked  Treat  MEWS Interventions Other (Comment) (no interventions, vitals rechecked. )  Notify: Provider  Provider Name/Title G. Shalhoub  Date Provider Notified 06/12/20  Time Provider Notified 0014  Notification Type Page  Notification Reason Other (Comment) (update. )  Response No new orders  Document  Patient Outcome Other (Comment) (no intervention, vital signs rechecked. )  Progress note created (see row info) Yes

## 2020-06-13 LAB — GLUCOSE, CAPILLARY
Glucose-Capillary: 195 mg/dL — ABNORMAL HIGH (ref 70–99)
Glucose-Capillary: 260 mg/dL — ABNORMAL HIGH (ref 70–99)

## 2020-06-13 LAB — T3: T3, Total: 69 ng/dL — ABNORMAL LOW (ref 71–180)

## 2020-06-13 MED ORDER — LEVOTHYROXINE SODIUM 125 MCG PO TABS: 125.0000 ug | ORAL_TABLET | Freq: Every day | ORAL | 0 refills | Status: AC

## 2020-06-13 MED ORDER — ALBUTEROL SULFATE HFA 108 (90 BASE) MCG/ACT IN AERS: 2.0000 | INHALATION_SPRAY | Freq: Four times a day (QID) | RESPIRATORY_TRACT | 0 refills | Status: AC | PRN

## 2020-06-13 MED ORDER — APIXABAN 5 MG PO TABS: 5.0000 mg | ORAL_TABLET | Freq: Two times a day (BID) | ORAL | 0 refills | Status: AC

## 2020-06-13 MED ORDER — APIXABAN 5 MG PO TABS
5.0000 mg | ORAL_TABLET | Freq: Two times a day (BID) | ORAL | Status: DC
  Administered 2020-06-13: 5 mg via ORAL
  Filled 2020-06-13: qty 1

## 2020-06-13 MED ORDER — METOPROLOL TARTRATE 50 MG PO TABS: 50.0000 mg | ORAL_TABLET | Freq: Two times a day (BID) | ORAL | 0 refills | Status: AC

## 2020-06-13 NOTE — Progress Notes (Signed)
Kathy Simpson to be D/C'd home per MD order. Discussed with the patient and all questions fully answered. VVS, Skin clean, dry and intact without evidence of skin break down, no evidence of skin tears noted. Evening dose of eliquis and metoprolol given per MD and pharmacy d/t patient not bring able to get to the pharmacy today. IV catheters discontinued intact. Sites without signs and symptoms of complications. Dressing and pressure applied.  An After Visit Summary was printed and given to the patient.  Patient escorted via [place], and D/C home via private auto.  Melonie [place]  06/13/2020 3:20 PM

## 2020-06-13 NOTE — Discharge Instructions (Signed)
Follow with Primary MD Lavone Orn, MD in 7 days, follow with the endocrinologist in 2 weeks  Get CBC, CMP, TSH, free T4, T3 , 2 view Chest X ray -  checked next visit within 1 week by Primary MD   Activity: As tolerated with Full fall precautions use walker/cane & assistance as needed  Disposition Home    Diet: Heart Healthy -low carbohydrate diet with 1.2 L fluid restriction per day   Special Instructions: If you have smoked or chewed Tobacco  in the last 2 yrs please stop smoking, stop any regular Alcohol  and or any Recreational drug use.  On your next visit with your primary care physician please Get Medicines reviewed and adjusted.  Please request your Prim.MD to go over all Hospital Tests and Procedure/Radiological results at the follow up, please get all Hospital records sent to your Prim MD by signing hospital release before you go home.  If you experience worsening of your admission symptoms, develop shortness of breath, life threatening emergency, suicidal or homicidal thoughts you must seek medical attention immediately by calling 911 or calling your MD immediately  if symptoms less severe.  You Must read complete instructions/literature along with all the possible adverse reactions/side effects for all the Medicines you take and that have been prescribed to you. Take any new Medicines after you have completely understood and accpet all the possible adverse reactions/side effects.        Person Under Monitoring Name: Kathy Simpson  Location: [address] [place] [address]   Infection Prevention Recommendations for Individuals Confirmed to have, or Being Evaluated for, 2019 Novel Coronavirus (COVID-19) Infection Who Receive Care at Home  Individuals who are confirmed to have, or are being evaluated for, COVID-19 should follow the prevention steps below until a healthcare provider or local or state health department says they can return to normal  activities.  Stay home except to get medical care You should restrict activities outside your home, except for getting medical care. Do not go to work, school, or public areas, and do not use public transportation or taxis.  Call ahead before visiting your doctor Before your medical appointment, call the healthcare provider and tell them that you have, or are being evaluated for, COVID-19 infection. This will help the healthcare provider's office take steps to keep other people from getting infected. Ask your healthcare provider to call the local or state health department.  Monitor your symptoms Seek prompt medical attention if your illness is worsening (e.g., difficulty breathing). Before going to your medical appointment, call the healthcare provider and tell them that you have, or are being evaluated for, COVID-19 infection. Ask your healthcare provider to call the local or state health department.  Wear a facemask You should wear a facemask that covers your nose and mouth when you are in the same room with other people and when you visit a healthcare provider. People who live with or visit you should also wear a facemask while they are in the same room with you.  Separate yourself from other people in your home As much as possible, you should stay in a different room from other people in your home. Also, you should use a separate bathroom, if available.  Avoid sharing household items You should not share dishes, drinking glasses, cups, eating utensils, towels, bedding, or other items with other people in your home. After using these items, you should wash them thoroughly with soap and water.  Cover your coughs  and sneezes Cover your mouth and nose with a tissue when you cough or sneeze, or you can cough or sneeze into your sleeve. Throw used tissues in a lined trash can, and immediately wash your hands with soap and water for at least 20 seconds or use an alcohol-based hand  rub.  Wash your [facility] your hands often and thoroughly with soap and water for at least 20 seconds. You can use an alcohol-based hand sanitizer if soap and water are not available and if your hands are not visibly dirty. Avoid touching your eyes, nose, and mouth with unwashed hands.   Prevention Steps for Caregivers and Household Members of Individuals Confirmed to have, or Being Evaluated for, COVID-19 Infection Being Cared for in the Home  If you live with, or provide care at home for, a person confirmed to have, or being evaluated for, COVID-19 infection please follow these guidelines to prevent infection:  Follow healthcare provider's instructions Make sure that you understand and can help the patient follow any healthcare provider instructions for all care.  Provide for the patient's basic needs You should help the patient with basic needs in the home and provide support for getting groceries, prescriptions, and other personal needs.  Monitor the patient's symptoms If they are getting sicker, call his or her medical provider and tell them that the patient has, or is being evaluated for, COVID-19 infection. This will help the healthcare provider's office take steps to keep other people from getting infected. Ask the healthcare provider to call the local or state health department.  Limit the number of people who have contact with the patient  If possible, have only one caregiver for the patient.  Other household members should stay in another home or place of residence. If this is not possible, they should stay  in another room, or be separated from the patient as much as possible. Use a separate bathroom, if available.  Restrict visitors who do not have an essential need to be in the home.  Keep older adults, very young children, and other sick people away from the patient Keep older adults, very young children, and those who have compromised immune systems or chronic  health conditions away from the patient. This includes people with chronic heart, lung, or kidney conditions, diabetes, and cancer.  Ensure good ventilation Make sure that shared spaces in the home have good air flow, such as from an air conditioner or an opened window, weather permitting.  Wash your hands often  Wash your hands often and thoroughly with soap and water for at least 20 seconds. You can use an alcohol based hand sanitizer if soap and water are not available and if your hands are not visibly dirty.  Avoid touching your eyes, nose, and mouth with unwashed hands.  Use disposable paper towels to dry your hands. If not available, use dedicated cloth towels and replace them when they become wet.  Wear a facemask and gloves  Wear a disposable facemask at all times in the room and gloves when you touch or have contact with the patient's blood, body fluids, and/or secretions or excretions, such as sweat, saliva, sputum, nasal mucus, vomit, urine, or feces.  Ensure the mask fits over your nose and mouth tightly, and do not touch it during use.  Throw out disposable facemasks and gloves after using them. Do not reuse.  Wash your hands immediately after removing your facemask and gloves.  If your personal clothing becomes contaminated, carefully remove  clothing and launder. Wash your hands after handling contaminated clothing.  Place all used disposable facemasks, gloves, and other waste in a lined container before disposing them with other household waste.  Remove gloves and wash your hands immediately after handling these items.  Do not share dishes, glasses, or other household items with the patient  Avoid sharing household items. You should not share dishes, drinking glasses, cups, eating utensils, towels, bedding, or other items with a patient who is confirmed to have, or being evaluated for, COVID-19 infection.  After the person uses these items, you should wash them  thoroughly with soap and water.  Wash laundry thoroughly  Immediately remove and wash clothes or bedding that have blood, body fluids, and/or secretions or excretions, such as sweat, saliva, sputum, nasal mucus, vomit, urine, or feces, on them.  Wear gloves when handling laundry from the patient.  Read and follow directions on labels of laundry or clothing items and detergent. In general, wash and dry with the warmest temperatures recommended on the label.  Clean all areas the individual has used often  Clean all touchable surfaces, such as counters, tabletops, doorknobs, bathroom fixtures, toilets, phones, keyboards, tablets, and bedside tables, every day. Also, clean any surfaces that may have blood, body fluids, and/or secretions or excretions on them.  Wear gloves when cleaning surfaces the patient has come in contact with.  Use a diluted bleach solution (e.g., dilute bleach with 1 part bleach and 10 parts water) or a household disinfectant with a label that says EPA-registered for coronaviruses. To make a bleach solution at home, add 1 tablespoon of bleach to 1 quart (4 cups) of water. For a larger supply, add  cup of bleach to 1 gallon (16 cups) of water.  Read labels of cleaning products and follow recommendations provided on product labels. Labels contain instructions for safe and effective use of the cleaning product including precautions you should take when applying the product, such as wearing gloves or eye protection and making sure you have good ventilation during use of the product.  Remove gloves and wash hands immediately after cleaning.  Monitor yourself for signs and symptoms of illness Caregivers and household members are considered close contacts, should monitor their health, and will be asked to limit movement outside of the home to the extent possible. Follow the monitoring steps for close contacts listed on the symptom monitoring form.   ? If you have additional  questions, contact your local health department or call the epidemiologist on call at [phone] (available 24/7). ? This guidance is subject to change. For the most up-to-date guidance from [facility], please refer to their website: YouBlogs.pl Information on my medicine - ELIQUIS (apixaban)  Why was Eliquis prescribed for you? Eliquis was prescribed for you to reduce the risk of a blood clot forming that can cause a stroke if you have a medical condition called atrial fibrillation (a type of irregular heartbeat).  What do You need to know about Eliquis ? Take your Eliquis TWICE DAILY - one tablet in the morning and one tablet in the evening with or without food. If you have difficulty swallowing the tablet whole please discuss with your pharmacist how to take the medication safely.  Take Eliquis exactly as prescribed by your doctor and DO NOT stop taking Eliquis without talking to the doctor who prescribed the medication.  Stopping may increase your risk of developing a stroke.  Refill your prescription before you run out.  After discharge, you should have  regular check-up appointments with your healthcare provider that is prescribing your Eliquis.  In the future your dose may need to be changed if your kidney function or weight changes by a significant amount or as you get older.  What do you do if you miss a dose? If you miss a dose, take it as soon as you remember on the same day and resume taking twice daily.  Do not take more than one dose of ELIQUIS at the same time to make up a missed dose.  Important Safety Information A possible side effect of Eliquis is bleeding. You should call your healthcare provider right away if you experience any of the following: ? Bleeding from an injury or your nose that does not stop. ? Unusual colored urine (red or dark brown) or unusual colored stools (red or black). ? Unusual bruising  for unknown reasons. ? A serious fall or if you hit your head (even if there is no bleeding).  Some medicines may interact with Eliquis and might increase your risk of bleeding or clotting while on Eliquis. To help avoid this, consult your healthcare provider or pharmacist prior to using any new prescription or non-prescription medications, including herbals, vitamins, non-steroidal anti-inflammatory drugs (NSAIDs) and supplements.  This website has more information on Eliquis (apixaban): http://www.eliquis.com/eliquis/home

## 2020-06-13 NOTE — TOC Transition Note (Signed)
Transition of Care [facility]) - CM/SW Discharge Note   Patient Details  Name: Kathy Simpson MRN: [id number] Date of Birth: 10/08/1951  Transition of Care [facility] CM/SW Contact:  Carles Collet, RN Phone Number: 06/13/2020, 8:26 AM   Clinical Narrative:    Kathy Simpson w patient over the phone (+COVID). She states she has all needed DME at home, and confirms that she declines HH. Patient will need O2. She would like to use [facility]. We discussed POC and home delivery. Referral placed. Oxygen will be brought to patient (unit drop off area) by [facility] prior to DC. Bedside RN aware. Eliquis coupon left at unit drop off area. Patient verbalized understanding for use. Bedside RN instructed to give coupon to patient.      Final next level of care: Home/Self Care Barriers to Discharge: No Barriers Identified   Patient Goals and CMS Choice Patient states their goals for this hospitalization and ongoing recovery are:: to return to home CMS Medicare.gov Compare Post Acute Care list provided to:: Patient Choice offered to / list presented to : Patient  Discharge Placement                       Discharge Plan and Services   Discharge Planning Services: CM Consult Post Acute Care Choice: Home Health          DME Arranged: Oxygen DME Agency: [facility] Date DME Agency Contacted: 06/13/20 Time DME Agency Contacted: [phone] Representative spoke with at DME Agency: [place]: Refused [place]          Social Determinants of Health ([place]) Interventions     Readmission Risk Interventions No flowsheet data found.

## 2020-06-13 NOTE — Discharge Summary (Addendum)
Kathy Simpson VOJ:[id number] DOB: 10-Apr-1951 DOA: 06/09/2020  PCP: Lavone Orn, MD  Admit date: 06/09/2020  Discharge date: 06/13/2020  Admitted From: Home  Disposition:  Home   Recommendations for Outpatient Follow-up:   Follow up with PCP in 1-2 weeks  PCP Please obtain BMP/CBC, 2 view CXR in 1week,  (see Discharge instructions)   PCP Please follow up on the following pending results: CBC, BMP, 2 view chest x-ray, TSH, free T4, T3 in 2 weeks.  Outpatient endocrine and cardiology follow-up.   Home Health: PT, RN  Equipment/Devices: 2lit [place] o2 PRN  Consultations: None  Discharge Condition: Stable    CODE STATUS: Full    Diet Recommendation: Heart Healthy Low Carb  Diet Order            Diet heart healthy/carb modified Room service appropriate? Yes; Fluid consistency: Thin; Fluid restriction: 2000 mL Fluid  Diet effective now                  Chief Complaint  Patient presents with  . Shortness of Breath     Brief history of present illness from the day of admission and additional interim summary    Kathy Simpson is a 69 y.o. female with medical history significant for hypertension, chronic diastolic CHF, hypothyroidism, type 2 diabetes mellitus, morbid obesity, presented to the hospital because of increasing shortness of breath.  She also complained of nausea, vomiting, headache and right hip pain.  She was found to have acute hypoxemic respiratory failure, acute exacerbation of chronic diastolic CHF and sepsis secondary to COVID-19 pneumonia.                                                                 Hospital Course   Sepsis secondary to COVID-19 pneumonia with acute hypoxic respiratory failure.  Treated with IV remdesivir and Decadron, much improved, titrate down oxygen, encouraged to sit up  in chair use I-S and flutter valve for pulmonary toiletry.  She has finished her remdesivir and IV steroid course and is symptom-free at rest, upon ambulation she requires 1 to 2 L nasal cannula oxygen, she will be discharged home on her home dose steroids which she takes chronically for adrenal insufficiency along with 2 L nasal cannula oxygen as needed.  Requested her to follow-up with PCP in a week.    Mild acute exacerbation of chronic diastolic CHF EF 99% on this admission: Patient decompensated placed on beta-blocker, 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 Lasix and fluid restriction.   Paroxysmal atrial fibrillation with RVR [place] vas 2 score of greater than 2: Converted to normal sinus rhythm.    Been placed on Eliquis and Lopressor.  TSH appears to be suppressed with high free T4, hold home dose Synthroid for 5 days then start at a lower dose,  follow with primary endocrinologist and PCP in 1 to 2 weeks.  Also outpatient cardiology follow-up.  Hypokalemia: Placed and stable.  Adrenal insufficiency: Continue oral dexamethasone for now.  Patient will be switched back to home dose of hydrocortisone at discharge.  Insulin-dependent diabetes mellitus with hyperglycemia: Continue home regimen follow with PCP for glycemic control and A1c monitoring.  Restless leg syndrome: Continue Requip.  Right hip pain: No acute fracture or dislocation noted on hip x-ray.  To chronic arthritis.  Body mass index is 59.48 kg/m.  (Morbid obesity): Follow with PCP for weight loss.     Discharge diagnosis     Principal Problem:   Pneumonia due to COVID-19 virus Active Problems:   Acute respiratory failure with hypoxia (HCC)   Candidal skin infection   Atrial fibrillation with RVR (HCC)   Acute on chronic diastolic CHF (congestive heart failure) (HCC)   Hypokalemia   Diabetes mellitus type 2 in obese [facility])   Right hip pain    Discharge instructions    Discharge Instructions    Discharge instructions    Complete by: As directed    Follow with Primary MD Lavone Orn, MD in 7 days, follow with the endocrinologist in 2 weeks  Get CBC, CMP, TSH, free T4, T3 , 2 view Chest X ray -  checked next visit within 1 week by Primary MD   Activity: As tolerated with Full fall precautions use walker/cane & assistance as needed  Disposition Home    Diet: Heart Healthy -low carbohydrate diet with 1.2 L fluid restriction per day   Special Instructions: If you have smoked or chewed Tobacco  in the last 2 yrs please stop smoking, stop any regular Alcohol  and or any Recreational drug use.  On your next visit with your primary care physician please Get Medicines reviewed and adjusted.  Please request your Prim.MD to go over all Hospital Tests and Procedure/Radiological results at the follow up, please get all Hospital records sent to your Prim MD by signing hospital release before you go home.  If you experience worsening of your admission symptoms, develop shortness of breath, life threatening emergency, suicidal or homicidal thoughts you must seek medical attention immediately by calling 911 or calling your MD immediately  if symptoms less severe.  You Must read complete instructions/literature along with all the possible adverse reactions/side effects for all the Medicines you take and that have been prescribed to you. Take any new Medicines after you have completely understood and accpet all the possible adverse reactions/side effects.   Increase activity slowly   Complete by: As directed    MyChart COVID-19 home monitoring program   Complete by: Jun 13, 2020    Is the patient willing to use the [facility] for home monitoring?: Yes   Temperature monitoring   Complete by: Jun 13, 2020    After how many days would you like to receive a notification of this patient's flowsheet entries?: 1      Discharge Medications   Allergies as of 06/13/2020      Reactions   Metformin And Related Diarrhea    Actos [pioglitazone] Other (See Comments)   Congestive heart    Adhesive [tape] Other (See Comments)   Pulls off skin   Enalapril Swelling   Livalo [pitavastatin] Other (See Comments)   Makes leg muscles weak    Maxzide [hydrochlorothiazide W-triamterene] Swelling   Morphine And Related Itching, Other (See Comments)   Mood changes   Nitrazine Paper [  nitrazine]    Statins Other (See Comments)   Makes legs numb    Zetia [ezetimibe] Swelling   Prostate [nutritional Supplements] Hives, Rash      Medication List    STOP taking these medications   amoxicillin-clavulanate 875-125 MG tablet Commonly known as: Augmentin   aspirin EC 81 MG tablet   Desitin 40 % ointment Generic drug: liver oil-zinc oxide   ibuprofen 800 MG tablet Commonly known as: ADVIL   insulin glargine 100 UNIT/ML injection Commonly known as: LANTUS   JARDIANCE PO   polyethylene glycol 17 g packet Commonly known as: MIRALAX / GLYCOLAX   spironolactone 25 MG tablet Commonly known as: ALDACTONE     TAKE these medications   acetaminophen 500 MG tablet Commonly known as: TYLENOL Take 1 tablet (500 mg total) by mouth every 6 (six) hours as needed for mild pain or headache.   albuterol 108 (90 Base) MCG/ACT inhaler Commonly known as: VENTOLIN HFA Inhale 2 puffs into the lungs every 6 (six) hours as needed for wheezing or shortness of breath.   apixaban 5 MG Tabs tablet Commonly known as: ELIQUIS Take 1 tablet (5 mg total) by mouth 2 (two) times daily.   diphenhydramine-acetaminophen 25-500 MG Tabs tablet Commonly known as: TYLENOL PM Take 1 tablet by mouth at bedtime as needed (sleep).   escitalopram 20 MG tablet Commonly known as: LEXAPRO Take 20 mg by mouth daily.   furosemide 40 MG tablet Commonly known as: LASIX Take 1 tablet (40 mg total) by mouth daily. What changed: when to take this   gabapentin 300 MG capsule Commonly known as: NEURONTIN Take 600 mg by mouth 2 (two) times  daily.   hydrocortisone 10 MG tablet Commonly known as: CORTEF Take 10-20 mg by mouth 2 (two) times daily. 20mg  in the morning and 10mg  at bedtime   insulin aspart 100 UNIT/ML injection Commonly known as: novoLOG Inject 0-9 Units into the skin 3 (three) times daily with meals.   levothyroxine 125 MCG tablet Commonly known as: SYNTHROID Take 1 tablet (125 mcg total) by mouth daily before breakfast. Start on 06/18/2020, till that time do not take any thyroid medicine. Start taking on: June 18, 2020 What changed:   medication strength  how much to take  additional instructions  These instructions start on June 18, 2020. If you are unsure what to do until then, ask your doctor or other care provider.   metoprolol tartrate 50 MG tablet Commonly known as: LOPRESSOR Take 1 tablet (50 mg total) by mouth 2 (two) times daily.   NovoLIN 70/30 FlexPen (70-30) 100 UNIT/ML KwikPen Generic drug: insulin isophane & regular human Inject 50 Units into the skin in the morning and at bedtime.   oxyCODONE-acetaminophen 5-325 MG tablet Commonly known as: PERCOCET/ROXICET Take 1 tablet by mouth every 4 (four) hours as needed for severe pain.   REFRESH OP Apply 1 drop to eye in the morning, at noon, and at bedtime.   rOPINIRole 1 MG tablet Commonly known as: REQUIP Take 1-2 mg by mouth 2 (two) times daily. 2mg  in the am , and 1mg  hs.   senna 8.6 MG Tabs tablet Commonly known as: SENOKOT Take 2 tablets (17.2 mg total) by mouth at bedtime.   Trulicity 3 XB/2.6OM Sopn Generic drug: Dulaglutide Inject 3 mg into the skin once a week.            Durable Medical Equipment  (From admission, onward)  Start     Ordered   06/12/20 1119  For home use only DME oxygen  Once       Question Answer Comment  Length of Need 6 Months   Mode or (Route) Nasal cannula   Liters per Minute 2   Frequency Continuous (stationary and portable oxygen unit needed)   Oxygen delivery system Gas       06/12/20 1118           Follow-up Information    [facility] Follow up.   Why: Home Oxygen Contact information: [address] [place] [address] [phone]        Lavone Orn, MD. Schedule an appointment as soon as possible for a visit in 1 week(s).   Specialty: Internal Medicine Contact information: [address], [address] [place] [address] [phone]        Delrae Rend, MD. Schedule an appointment as soon as possible for a visit in 2 week(s).   Specialty: Endocrinology Contact information: [address] [place] [address] [phone]        Jerline Pain, MD. Schedule an appointment as soon as possible for a visit in 2 week(s).   Specialty: Cardiology Why: Atrial fibrillation Contact information: [address] [place] [address] [place] [address] [phone]               Major procedures and Radiology Reports - PLEASE review detailed and final reports thoroughly  -        DG Chest 2 View  Result Date: 06/09/2020 CLINICAL DATA:  Shortness of breath for 2 hours EXAM: CHEST - 2 VIEW COMPARISON:  05/09/2016 FINDINGS: Cardiomegaly with the generalized interstitial prominence above prior baseline. No air bronchogram, effusion, or pneumothorax IMPRESSION: Cardiomegaly with probable mild edema. Electronically Signed   By: Monte Fantasia M.D.   On: 06/09/2020 06:43   CT Angio Chest PE W and/or Wo Contrast  Result Date: 06/09/2020 CLINICAL DATA:  Shortness of breath EXAM: CT ANGIOGRAPHY CHEST WITH CONTRAST TECHNIQUE: Multidetector CT imaging of the chest was performed using the standard protocol during bolus administration of intravenous contrast. Multiplanar CT image reconstructions and MIPs were obtained to evaluate the vascular anatomy. CONTRAST:  34mL OMNIPAQUE IOHEXOL 350 MG/ML SOLN COMPARISON:  Chest x-ray today FINDINGS: Cardiovascular: Cardiomegaly. Small pericardial effusion.  Coronary artery calcifications most pronounced in the left main, left anterior descending and left circumflex coronary arteries. Aortic atherosclerosis. No aneurysm. No evidence of pulmonary embolus. Mediastinum/Nodes: No mediastinal, hilar, or axillary adenopathy. Trachea and esophagus are unremarkable. Thyroid unremarkable. Lungs/Pleura: Mild vascular congestion. Scattered mild hazy opacities in the perihilar and lower lobe regions may reflect early interstitial edema. 9 mm nodule along the right minor fissure. Nodular airspace opacity also seen in the right middle lobe adjacent to the major fissure measuring up to 17 mm, favor inflammatory/infectious. Upper Abdomen: Imaging into the upper abdomen shows no acute findings. Musculoskeletal: Chest wall soft tissues are unremarkable. No acute bony abnormality. Review of the MIP images confirms the above findings. IMPRESSION: Cardiomegaly, vascular congestion mild perihilar hazy opacities which could reflect early interstitial edema. No evidence of pulmonary embolus. Small pericardial effusion. 9 mm nodule along the right minor fissure. 17 mm nodular airspace opacity in the right middle lobe. Favor inflammatory/infectious. These could be followed with repeat chest CT in 6 months to ensure resolution/stability. Electronically Signed   By: Rolm Baptise M.D.   On: 06/09/2020 09:55   DG HIP  UNILAT WITH PELVIS 2-3 VIEWS RIGHT  Result Date: 06/09/2020 CLINICAL DATA:  69 year old female with right hip pain. EXAM: DG HIP (WITH OR WITHOUT PELVIS) 2-3V RIGHT COMPARISON:  CT abdomen pelvis dated 04/28/2016. FINDINGS: Evaluation is limited due to osteopenia and soft tissue attenuation. There is no acute fracture or dislocation. The bones are osteopenic. Partially visualized lower lumbar fusion hardware. Soft tissues are unremarkable. IMPRESSION: No acute fracture or dislocation. Electronically Signed   By: Anner Crete M.D.   On: 06/09/2020 21:41   ECHOCARDIOGRAM  LIMITED  Result Date: 06/09/2020    ECHOCARDIOGRAM LIMITED REPORT   Patient Name:   Kathy Simpson Date of Exam: 06/09/2020 Medical Rec #:  [id number]       Height:       63.0 in Accession #:    [id number]      Weight:       309.0 lb Date of Birth:  [birth year]       BSA:          2.326 m Patient Age:    6 years        BP:           83/25 mmHg Patient Gender: F               HR:           80 bpm. Exam Location:  Inpatient Procedure: Limited Echo, Color Doppler, Cardiac Doppler and Intracardiac            Opacification Agent Indications:    I48.91* Unspecified atrial fibrillation  History:        Patient has prior history of Echocardiogram examinations, most                 recent 05/09/2016. CHF; Risk Factors:Hypertension, Diabetes and                 Sleep Apnea. COVID+ 06/09/20.  Sonographer:    Raquel Sarna Senior RDCS Referring Phys: [phone] RONDELL A SMITH  Sonographer Comments: Technically difficult due to patient body habitus. IMPRESSIONS  1. Left ventricular ejection fraction, by estimation, is 55 to 60%. The left ventricle has normal function. The left ventricle demonstrates regional wall motion abnormalities (see scoring diagram/findings for description).  2. Moderate pericardial effusion. The pericardial effusion is circumferential. There is no evidence of cardiac tamponade.  3. The aortic valve is tricuspid.  4. There is normal pulmonary artery systolic pressure.  5. The inferior vena cava is dilated in size with <50% respiratory variability, suggesting right atrial pressure of 15 mmHg. FINDINGS  Left Ventricle: Apical akinesis. Left ventricular ejection fraction, by estimation, is 55 to 60%. The left ventricle has normal function. The left ventricle demonstrates regional wall motion abnormalities. Definity contrast agent was given IV to delineate the left ventricular endocardial borders. Right Ventricle: There is normal pulmonary artery systolic pressure. The tricuspid regurgitant velocity is 2.02 m/s, and with  an assumed right atrial pressure of 15 mmHg, the estimated right ventricular systolic pressure is 08.6 mmHg. Pericardium: Pericardial effusion measuring 1.87 cm. A moderately sized pericardial effusion is present. The pericardial effusion is circumferential. There is excessive respiratory variation in the tricuspid valve spectral Doppler velocities. There is no  evidence of cardiac tamponade. Tricuspid Valve: Tricuspid valve regurgitation is trivial. Aortic Valve: The aortic valve is tricuspid. Venous: The inferior vena cava is dilated in size with less than 50% respiratory variability, suggesting right atrial pressure of 15 mmHg.  TRICUSPID VALVE TR Peak grad:  16.3 mmHg TR Vmax:        202.00 cm/s Skeet Latch MD Electronically signed by Skeet Latch MD Signature Date/Time: 06/09/2020/4:27:03 PM    Final     Micro Results     Recent Results (from the past 240 hour(s))  Blood Culture (routine x 2)     Status: None (Preliminary result)   Collection Time: 06/09/20  8:17 AM   Specimen: BLOOD LEFT FOREARM  Result Value Ref Range Status   Specimen Description BLOOD LEFT FOREARM  Final   Special Requests   Final    BOTTLES DRAWN AEROBIC AND ANAEROBIC Blood Culture results may not be optimal due to an inadequate volume of blood received in culture bottles   Culture   Final    NO GROWTH 3 DAYS Performed at [facility], [place] [address], [place], [place] [address]    Report Status PENDING  Incomplete  Blood Culture (routine x 2)     Status: None (Preliminary result)   Collection Time: 06/09/20  8:30 AM   Specimen: BLOOD  Result Value Ref Range Status   Specimen Description BLOOD SITE NOT SPECIFIED  Final   Special Requests   Final    BOTTLES DRAWN AEROBIC AND ANAEROBIC Blood Culture results may not be optimal due to an inadequate volume of blood received in culture bottles   Culture   Final    NO GROWTH 3 DAYS Performed at [facility], [place] [address], [place], [place]  [address]    Report Status PENDING  Incomplete  SARS Coronavirus 2 by RT PCR (hospital order, performed in [facility] hospital lab) Nasopharyngeal Nasopharyngeal Swab     Status: Abnormal   Collection Time: 06/09/20  8:30 AM   Specimen: Nasopharyngeal Swab  Result Value Ref Range Status   SARS Coronavirus 2 POSITIVE (A) NEGATIVE Final    Comment: RESULT CALLED TO, READ BACK BY AND VERIFIED WITH: RN JESSICA EASLEY 0945 06/09/2020 KB (NOTE) SARS-CoV-2 target nucleic acids are DETECTED  SARS-CoV-2 RNA is generally detectable in upper respiratory specimens  during the acute phase of infection.  Positive results are indicative  of the presence of the identified virus, but do not rule out bacterial infection or co-infection with other pathogens not detected by the test.  Clinical correlation with patient history and  other diagnostic information is necessary to determine patient infection status.  The expected result is negative.  Fact Sheet for Patients:   StrictlyIdeas.no   Fact Sheet for Healthcare Providers:   BankingDealers.co.za    This test is not yet approved or cleared by the [place] FDA and  has been authorized for detection and/or diagnosis of SARS-CoV-2 by FDA under an Emergency Use Authorization (EUA).  This EUA will remain in effect (meaning this  test can be used) for the duration of  the COVID-19 declaration under Section 564(b)(1) of the Act, 21 U.S.C. section 360-bbb-3(b)(1), unless the authorization is terminated or revoked sooner.  Performed at [facility], [place] [address] [place], [place] [address]   MRSA PCR Screening     Status: None   Collection Time: 06/10/20  9:04 AM   Specimen: Nasal Mucosa; Nasopharyngeal  Result Value Ref Range Status   MRSA by PCR NEGATIVE NEGATIVE Final    Comment:        The GeneXpert MRSA Assay (FDA approved for NASAL specimens only), is one component of a comprehensive  MRSA colonization surveillance program. It is not intended to diagnose  MRSA infection nor to guide or monitor treatment for MRSA infections. Performed at [facility], [place] [address], [place], [place] [address]     Today   Subjective    Kathy Simpson today has no headache,no chest abdominal pain,no new weakness tingling or numbness, feels much better wants to go home today.     Objective   Blood pressure (!) 156/73, pulse 66, temperature 97.8 F (36.6 C), temperature source Oral, resp. rate 20, height 5\' 2"  (1.575 m), weight (!) 146.8 kg, SpO2 96 %.   Intake/Output Summary (Last 24 hours) at 06/13/2020 0949 Last data filed at 06/13/2020 0500 Gross per 24 hour  Intake 120 ml  Output 1302 ml  Net -1182 ml    Exam  Awake Alert, No new F.N deficits, Normal affect [place].AT,PERRAL Supple Neck,No JVD, No cervical lymphadenopathy appriciated.  Symmetrical Chest wall movement, Good air movement bilaterally, CTAB RRR,No Gallops,Rubs or new Murmurs, No Parasternal Heave +ve B.Sounds, Abd Soft, Non tender, No organomegaly appriciated, No rebound -guarding or rigidity. No Cyanosis, Clubbing or edema, No new Rash or bruise   Data Review   CBC w Diff:  Lab Results  Component Value Date   WBC 8.4 06/12/2020   HGB 13.3 06/12/2020   HGB 8.5 (L) 11/01/2014   HCT 41.6 06/12/2020   HCT 25.7 (L) 11/01/2014   PLT 170 06/12/2020   PLT 132 (L) 11/01/2014   LYMPHOPCT 18 06/12/2020   LYMPHOPCT 24.5 11/01/2014   MONOPCT 6 06/12/2020   MONOPCT 9.5 11/01/2014   EOSPCT 0 06/12/2020   EOSPCT 0.9 11/01/2014   BASOPCT 0 06/12/2020   BASOPCT 0.1 11/01/2014    CMP:  Lab Results  Component Value Date   NA 135 06/12/2020   NA 135 (L) 11/01/2014   K 3.6 06/12/2020   K 3.7 11/01/2014   CL 93 (L) 06/12/2020   CL 96 (L) 11/01/2014   CO2 26 06/12/2020   CO2 30 11/01/2014   BUN 16 06/12/2020   BUN 20 (H) 11/01/2014   CREATININE 0.74 06/12/2020   CREATININE 0.89 11/01/2014   PROT  7.3 06/12/2020   PROT 7.3 10/30/2014   ALBUMIN 3.4 (L) 06/12/2020   ALBUMIN 2.8 (L) 10/30/2014   BILITOT 0.7 06/12/2020   BILITOT 0.7 10/30/2014   ALKPHOS 47 06/12/2020   ALKPHOS 58 10/30/2014   AST 31 06/12/2020   AST 27 10/30/2014   ALT 29 06/12/2020   ALT 31 10/30/2014  .  Lab Results  Component Value Date   TSH <0.010 (L) 06/12/2020   Lab Results  Component Value Date   HGBA1C 6.9 (H) 06/11/2020    Total Time in preparing paper work, data evaluation and todays exam - 9 minutes  Lala Lund M.D on 06/13/2020 at 9:49 AM  [facility]   Office  [phone]

## 2020-06-14 LAB — CULTURE, BLOOD (ROUTINE X 2)
Culture: NO GROWTH
Culture: NO GROWTH

## 2020-06-15 DIAGNOSIS — U071 COVID-19: Secondary | ICD-10-CM | POA: Diagnosis not present

## 2020-06-15 DIAGNOSIS — I491 Atrial premature depolarization: Secondary | ICD-10-CM | POA: Diagnosis not present

## 2020-06-15 DIAGNOSIS — I5033 Acute on chronic diastolic (congestive) heart failure: Secondary | ICD-10-CM | POA: Diagnosis not present

## 2020-06-15 DIAGNOSIS — M25551 Pain in right hip: Secondary | ICD-10-CM | POA: Diagnosis not present

## 2020-06-15 DIAGNOSIS — J9601 Acute respiratory failure with hypoxia: Secondary | ICD-10-CM | POA: Diagnosis not present

## 2020-06-15 DIAGNOSIS — Z743 Need for continuous supervision: Secondary | ICD-10-CM | POA: Diagnosis not present

## 2020-06-15 DIAGNOSIS — R001 Bradycardia, unspecified: Secondary | ICD-10-CM | POA: Diagnosis not present

## 2020-06-15 DIAGNOSIS — I4891 Unspecified atrial fibrillation: Secondary | ICD-10-CM | POA: Diagnosis not present

## 2020-06-15 DIAGNOSIS — R000 Tachycardia, unspecified: Secondary | ICD-10-CM | POA: Diagnosis not present

## 2020-06-27 DEATH — deceased
